# Patient Record
Sex: Female | Born: 1990 | Race: Asian | Hispanic: No | Marital: Married | Smoking: Never smoker
Health system: Southern US, Community
[De-identification: ages and names within clinical notes are randomized; demographics above are authoritative.]

## PROBLEM LIST (undated history)

## (undated) DIAGNOSIS — D582 Other hemoglobinopathies: Secondary | ICD-10-CM

## (undated) DIAGNOSIS — I350 Nonrheumatic aortic (valve) stenosis: Secondary | ICD-10-CM

## (undated) DIAGNOSIS — R000 Tachycardia, unspecified: Secondary | ICD-10-CM

## (undated) DIAGNOSIS — D649 Anemia, unspecified: Secondary | ICD-10-CM

## (undated) DIAGNOSIS — I099 Rheumatic heart disease, unspecified: Secondary | ICD-10-CM

## (undated) HISTORY — DX: Tachycardia, unspecified: R00.0

## (undated) HISTORY — DX: Anemia, unspecified: D64.9

## (undated) HISTORY — DX: Nonrheumatic aortic (valve) stenosis: I35.0

## (undated) HISTORY — DX: Other hemoglobinopathies: D58.2

## (undated) HISTORY — DX: Rheumatic heart disease, unspecified: I09.9

## (undated) HISTORY — PX: EYE SURGERY: SHX253

## (undated) HISTORY — PX: NO PAST SURGERIES: SHX2092

---

## 2009-02-13 DIAGNOSIS — I099 Rheumatic heart disease, unspecified: Secondary | ICD-10-CM

## 2009-02-13 HISTORY — DX: Rheumatic heart disease, unspecified: I09.9

## 2009-12-03 ENCOUNTER — Emergency Department (HOSPITAL_COMMUNITY)
Admission: EM | Admit: 2009-12-03 | Discharge: 2009-12-03 | Payer: Self-pay | Source: Home / Self Care | Admitting: Family Medicine

## 2010-10-04 ENCOUNTER — Inpatient Hospital Stay (HOSPITAL_COMMUNITY)
Admission: AD | Admit: 2010-10-04 | Discharge: 2010-10-04 | Disposition: A | Discharge status: Home / Self Care | Payer: Medicaid Other | Source: Ambulatory Visit | Attending: Family Medicine | Admitting: Family Medicine

## 2010-10-04 ENCOUNTER — Encounter: Payer: Self-pay | Admitting: Cardiovascular Disease

## 2010-10-04 ENCOUNTER — Inpatient Hospital Stay (HOSPITAL_COMMUNITY): Payer: Medicaid Other

## 2010-10-04 ENCOUNTER — Other Ambulatory Visit: Payer: Self-pay | Admitting: Family Medicine

## 2010-10-04 DIAGNOSIS — O368390 Maternal care for abnormalities of the fetal heart rate or rhythm, unspecified trimester, not applicable or unspecified: Secondary | ICD-10-CM | POA: Insufficient documentation

## 2010-10-04 DIAGNOSIS — Z3400 Encounter for supervision of normal first pregnancy, unspecified trimester: Secondary | ICD-10-CM

## 2010-10-04 LAB — CBC
HCT: 34.2 % — ABNORMAL LOW (ref 36.0–46.0)
MCV: 72.6 fL — ABNORMAL LOW (ref 78.0–100.0)
RBC: 4.71 MIL/uL (ref 3.87–5.11)
WBC: 13.7 10*3/uL — ABNORMAL HIGH (ref 4.0–10.5)

## 2010-10-04 LAB — TYPE AND SCREEN: Antibody Screen: NEGATIVE

## 2010-10-04 LAB — GC/CHLAMYDIA PROBE AMP, GENITAL: Gonorrhea: NEGATIVE

## 2010-10-04 LAB — HIV ANTIBODY (ROUTINE TESTING W REFLEX): HIV: NONREACTIVE

## 2010-10-04 LAB — ABO/RH: ABO/RH(D): B POS

## 2010-10-04 LAB — RUBELLA ANTIBODY, IGM: Rubella: IMMUNE

## 2010-10-04 NOTE — ED Provider Notes (Addendum)
Chief Complaint:  Possible IUFD  Patricia Frederick is  20 y.o. G1 who presents with concern for IUFD.  She was seen today at [facility] for 1st prenatal visit and they were unable to obtain FHT by doppler or on ultrasound.  She was sent here for further evaluation.  She denies any FM, no LOF, no VB, no cramping or abdominal pain.  She has had some nausea.  Obstetrical/Gynecological History: OB History    No data available      Past Medical History: No past medical history on file.  Past Surgical History: No past surgical history on file.  Family History: No family history on file.  Social History: History  Substance Use Topics  . Smoking status: Not on file  . Smokeless tobacco: Not on file  . Alcohol Use: Not on file    Allergies: Allergies no known allergies  No prescriptions prior to admission    Review of Systems - Negative except listed above in HPI  Physical Exam   There were no vitals taken for this visit.  General: General appearance - alert, well appearing, and in no distress Chest - clear to auscultation, no wheezes, rales or rhonchi, symmetric air entry Heart - normal rate, regular rhythm, normal S1, S2, no murmurs, rubs, clicks or gallops Abdomen - gravid, size cwd, soft, nontender Extremities - peripheral pulses normal, no pedal edema, no clubbing or cyanosis Skin - normal coloration and turgor, no rashes, no suspicious skin lesions noted   Labs: No results found for this or any previous visit (from the past 24 hour(s)). Imaging Studies:  No results found.   Assessment: 20yo G1 with concern for IUFD  Plan: -Ultrasound to assess fetal status -Will check CBC and type and screen  Will adjust plan according to results.  I have discussed this case with Philipp Deputy CNM who is in agreement with this plan  Lindaann Slough MD  Addendum- ultrasound shows viable 10+2wk fetus, EDD 04/02/11.  Will send pt with pregnancy verification letter to continue [facility]  at Health Dept. Delton See MD

## 2010-10-04 NOTE — Progress Notes (Signed)
Sent in from health dept, 1st appt today 19.5wks- no FH.  Sent in to confirm IUFD.  Non- english speaking.  Pt taken directly to rm.  Translation phone available.  MD notified of arrival

## 2010-10-04 NOTE — Progress Notes (Signed)
Pt seen at [facility] health for 1st prenatal visit, pt sent to [facility] for ultrasound, not heart beat found at 19 5/7 wks. As per LMP

## 2010-10-18 ENCOUNTER — Encounter: Payer: Self-pay | Admitting: Cardiovascular Disease

## 2010-10-19 ENCOUNTER — Encounter: Payer: Self-pay | Admitting: Cardiovascular Disease

## 2010-10-19 ENCOUNTER — Ambulatory Visit (INDEPENDENT_AMBULATORY_CARE_PROVIDER_SITE_OTHER): Payer: Self-pay | Admitting: Cardiovascular Disease

## 2010-10-19 VITALS — BP 102/74 | HR 101 | Resp 12 | Wt 116.0 lb

## 2010-10-19 DIAGNOSIS — R9431 Abnormal electrocardiogram [ECG] [EKG]: Secondary | ICD-10-CM

## 2010-10-19 DIAGNOSIS — R011 Cardiac murmur, unspecified: Secondary | ICD-10-CM

## 2010-10-19 DIAGNOSIS — R000 Tachycardia, unspecified: Secondary | ICD-10-CM

## 2010-10-19 NOTE — Patient Instructions (Signed)
Your physician recommends that you schedule a follow-up appointment in: AS NEEDED  Your physician recommends that you continue on your current medications as directed. Please refer to the Current Medication list given to you today.  Your physician has requested that you have an echocardiogram. Echocardiography is a painless test that uses sound waves to create images of your heart. It provides your doctor with information about the size and shape of your heart and how well your heart's chambers and valves are working. This procedure takes approximately one hour. There are no restrictions for this procedure. DX MURMUR  PT'S CONVENIENCE

## 2010-10-19 NOTE — Assessment & Plan Note (Signed)
Nonspecfic ECG changes not likely reflective of severe valve disease  BP ok

## 2010-10-19 NOTE — Assessment & Plan Note (Signed)
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  Echo.  Should not be an issue with pregancy

## 2010-10-19 NOTE — Progress Notes (Signed)
20 yo referred by [facility] of [facility] for murmur.  She is 5 months pregnant with due date in March.  History through husband as patient does not speak [place].  Have only been here from [place] about a year.  Has a healthy 20 yo girl at home with no prior obstetric complications.  ? Rheumatic heart disease.  Has exertional dyspnea primarily since pregnancy.  No recent echo.  HR been on high side.  No palpitations or SSCP.  No meds.  No prevoius surgeries outside of first pregnancy.    ROS: Denies fever, malais, weight loss, blurry vision, decreased visual acuity, cough, sputum, SOB, hemoptysis, pleuritic pain, palpitaitons, heartburn, abdominal pain, melena, lower extremity edema, claudication, or rash.  All other systems reviewed and negative   General: Affect appropriate Healthy:  appears stated age HEENT: normal Neck supple with no adenopathy JVP normal no bruits no thyromegaly Lungs clear with no wheezing and good diaphragmatic motion Heart:  S1/S2 SEM  No ,rub, gallop or click PMI normal Abdomen: benighn, BS positve, no tenderness, no AAA no bruit.  No HSM or HJR Distal pulses intact with no bruits No edema Neuro non-focal Skin warm and dry No muscular weakness  Medications Current Outpatient Prescriptions  Medication Sig Dispense Refill  . Prenatal Vit-Fe Psac Cmplx-FA (PRENATAL MULTIVITAMIN) 60-1 MG tablet Take 1 tablet by mouth daily with breakfast.          Allergies Review of patient's allergies indicates no known allergies.  Family History: Family History  Problem Relation Age of Onset  . Asthma Other     Social History: History   Social History  . Marital Status: Married    Spouse Name: N/A    Number of Children: N/A  . Years of Education: N/A   Occupational History  . Not on file.   Social History Main Topics  . Smoking status: Not on file  . Smokeless tobacco: Not on file  . Alcohol Use:   . Drug Use:   . Sexually Active:    Other Topics  Concern  . Not on file   Social History Narrative  . No narrative on file    Electrocardiogram:  NSR 97 nonspecfic ST/T wave changes  Assessment and Plan

## 2010-10-19 NOTE — Assessment & Plan Note (Signed)
Hb ok ? TSH done by OB/GYN  Echo to assess EF.  Likely related to pregnancy

## 2010-10-27 ENCOUNTER — Ambulatory Visit (HOSPITAL_COMMUNITY): Payer: Medicaid Other | Attending: Cardiovascular Disease

## 2010-10-27 DIAGNOSIS — I379 Nonrheumatic pulmonary valve disorder, unspecified: Secondary | ICD-10-CM | POA: Insufficient documentation

## 2010-10-27 DIAGNOSIS — R0989 Other specified symptoms and signs involving the circulatory and respiratory systems: Secondary | ICD-10-CM | POA: Insufficient documentation

## 2010-10-27 DIAGNOSIS — R000 Tachycardia, unspecified: Secondary | ICD-10-CM | POA: Insufficient documentation

## 2010-10-27 DIAGNOSIS — R0609 Other forms of dyspnea: Secondary | ICD-10-CM | POA: Insufficient documentation

## 2010-10-27 DIAGNOSIS — R9431 Abnormal electrocardiogram [ECG] [EKG]: Secondary | ICD-10-CM

## 2010-10-27 DIAGNOSIS — R011 Cardiac murmur, unspecified: Secondary | ICD-10-CM

## 2010-10-27 DIAGNOSIS — I059 Rheumatic mitral valve disease, unspecified: Secondary | ICD-10-CM | POA: Insufficient documentation

## 2010-10-27 DIAGNOSIS — I079 Rheumatic tricuspid valve disease, unspecified: Secondary | ICD-10-CM | POA: Insufficient documentation

## 2010-11-01 ENCOUNTER — Other Ambulatory Visit: Payer: Self-pay | Admitting: Family Medicine

## 2010-11-01 DIAGNOSIS — Z3689 Encounter for other specified antenatal screening: Secondary | ICD-10-CM

## 2010-11-02 ENCOUNTER — Telehealth: Payer: Self-pay | Admitting: Cardiovascular Disease

## 2010-11-02 NOTE — Telephone Encounter (Signed)
Returning call back to nurse.  

## 2010-11-03 ENCOUNTER — Encounter: Payer: Self-pay | Admitting: *Deleted

## 2010-11-04 ENCOUNTER — Ambulatory Visit (INDEPENDENT_AMBULATORY_CARE_PROVIDER_SITE_OTHER): Payer: Medicaid Other | Admitting: Physician Assistant

## 2010-11-04 ENCOUNTER — Encounter: Payer: Self-pay | Admitting: Physician Assistant

## 2010-11-04 VITALS — BP 100/70 | HR 94 | Resp 18 | Ht 60.0 in | Wt 116.8 lb

## 2010-11-04 DIAGNOSIS — I359 Nonrheumatic aortic valve disorder, unspecified: Secondary | ICD-10-CM

## 2010-11-04 DIAGNOSIS — I350 Nonrheumatic aortic (valve) stenosis: Secondary | ICD-10-CM | POA: Insufficient documentation

## 2010-11-04 NOTE — Assessment & Plan Note (Signed)
I spent 30 minutes discussing the findings of her echo with the patient and her brother.  Communication was affected by the language barrier.  We discussed the severity of her AS.  I have explained that she is very high risk for peripartum acute CHF.  I have also explained that she will need AVR after delivery as soon as possible.  She cannot get pregnant again until her AV is corrected.  It is hard for me to know if she is describing exertional angina.  She does note some chest pain and DOE.  She did have these same symptoms with her first pregnancy as well.  I spoke with Dr. Eden Emms over the telephone.  He will likely place her on Lasix when she starts her 3rd trimester to help avoid volume overload.  I will have her see him back in 3-4 weeks.  We will try to be in touch with her Ob to apprise them of the severity of her AS and that she is a very high risk pregnancy.

## 2010-11-04 NOTE — Progress Notes (Signed)
History of Present Illness: Primary Cardiologist:  Dr. Charlton Haws   Patricia Frederick is a 20 y.o. [place] female who presents to follow up on her 2D echo.  She saw Dr. Eden Emms on 9/5 for a murmur.  She noted DOE primarily since pregnancy.  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  Dr. Eden Emms asked that the patient follow up with him to discuss her echo results, however she was put on my schedule today.  He did note that she would be high risk for CHF with delivery and that she will need AVR before she can get pregnant again.  She is here with her brother who speaks some English and helps interpret.  She does note some chest pain with walking too fast.  She also notes DOE.  She probably describes 2b-3 symptoms.  No orthopnea, PND or edema.  No syncope.  She notes chest pain and dyspnea with her last pregnancy, but this is more severe.    Past Medical History  Diagnosis Date  . Aortic stenosis, severe     echo 9/12: EF 55-65%, severe AS, mean gradient 49 mmHg, unicuspid Aortic Valve  . Anemia   . Tachycardia     Current Outpatient Prescriptions  Medication Sig Dispense Refill  . Prenatal Vit-Fe Psac Cmplx-FA (PRENATAL MULTIVITAMIN) 60-1 MG tablet Take 1 tablet by mouth daily with breakfast.          Allergies: No Known Allergies  Social Hx:  Non smoker  ROS:  Please see the history of present illness.  All other systems reviewed and negative.   Vital Signs: BP 100/70  Pulse 94  Resp 18  Ht 5' (1.524 m)  Wt 116 lb 12.8 oz (52.98 kg)  BMI 22.81 kg/m2  PHYSICAL EXAM: Well nourished, well developed, in no acute distress HEENT: normal Neck: no JVD Cardiac:  normal S1, S2; RRR; 2/6 harsh systolic murmur at RUSB Lungs:  clear to auscultation bilaterally, no wheezing, rhonchi or rales Abd: soft; fundus below umbilicus Ext: no edema Skin: warm and dry Neuro:  CNs 2-12 intact, no focal abnormalities noted  ASSESSMENT AND PLAN:

## 2010-11-04 NOTE — Patient Instructions (Addendum)
Aortic Stenosis Aortic stenosis, or aortic valve stenosis, is a narrowing of the aortic valve. When the aortic valve is narrowed, the valve does not open and close very well. This restricts blood flow between the left side of the heart and the aorta (the large artery which takes blood to the rest of the body). This restriction makes it hard for your heart to pump blood. This extra work can weaken your heart and can lead to heart failure.  CAUSES Causes of aortic valve stenosis can vary. Some of these can include:  Calcium deposits on the aortic valve. Calcium can buildup on the aortic valve and make it stiff. This cause of aortic stenosis is most common in people over the age of 69.   Congenital heart defect. This can occur during the development of the fetus and can result in an aortic valve defect.   Rheumatic fever. Rheumatic fever is a bacterial infection that can develop from a strep throat infection. The bacteria from rheumatic fever can attach themselves to the valve. This can cause scarring on the aortic valve, causing it to become narrow.  SYMPTOMS Symptoms of aortic valve stenosis develop when the valve disease is severe. Symptoms can include:  Shortness of breath, especially with physical activity.   Feeling tired (fatigue).   Chest pain (angina) or tightness.   Feeing your heart race or beat funny (heart palpitations).   Dizziness or fainting.  DIAGNOSIS Aortic stenosis is diagnosed through:  A physical exam and symptoms.   A heart murmur.   Echocardiography. This test uses sound waves to produce images of your heart.  TREATMENT  Surgery is the treatment for aortic valve stenosis.   Surgery may not be needed right away. Surgery is necessary when narrowing of the aortic valve becomes severe, and symptoms develop or become worse.   Medications cannot reverse aortic valve stenosis.  HOME CARE INSTRUCTIONS  If you have aortic stenosis, you many need to avoid strenuous  physical activity. Talk with your caregiver about what types of activities you should avoid.   If you are a woman with aortic valve stenosis and are of child-bearing age, talk to your caregiver before you become pregnant.   If you become pregnant, you will need to be monitored by your obstetrician and cardiologist throughout your pregnancy, labor and delivery, and after delivery.  SEEK IMMEDIATE MEDICAL CARE IF YOU DEVELOP:  Chest pain or tightness.   Shortness of breath or difficulty breathing.   Lightheadedness or fainting.   Heart palpitations or skipped heart beats.  Document Released: 10/29/2002 Document Re-Released: 07/20/2009 [facility] Patient Information 2011 [place], [place].  Your physician recommends that you schedule a follow-up appointment in: 11/23/10 @ 10:30 with DR. Eden Emms

## 2010-11-04 NOTE — Telephone Encounter (Signed)
FAXED COPY OF ECHO TO OB NURSE YESTERDAY AND PT HAD APPT WITH PA TODAY./ CY

## 2010-11-07 ENCOUNTER — Telehealth: Payer: Self-pay | Admitting: *Deleted

## 2010-11-07 ENCOUNTER — Ambulatory Visit (INDEPENDENT_AMBULATORY_CARE_PROVIDER_SITE_OTHER): Payer: Medicaid Other | Admitting: Family Medicine

## 2010-11-07 DIAGNOSIS — Z331 Pregnant state, incidental: Secondary | ICD-10-CM

## 2010-11-07 DIAGNOSIS — I359 Nonrheumatic aortic valve disorder, unspecified: Secondary | ICD-10-CM

## 2010-11-07 DIAGNOSIS — I350 Nonrheumatic aortic (valve) stenosis: Secondary | ICD-10-CM

## 2010-11-07 DIAGNOSIS — O0990 Supervision of high risk pregnancy, unspecified, unspecified trimester: Secondary | ICD-10-CM

## 2010-11-07 LAB — POCT URINALYSIS DIP (DEVICE)
Bilirubin Urine: NEGATIVE
Ketones, ur: NEGATIVE mg/dL
Leukocytes, UA: NEGATIVE
Nitrite: NEGATIVE
Protein, ur: NEGATIVE mg/dL

## 2010-11-07 NOTE — Patient Instructions (Addendum)
Aortic Stenosis Aortic stenosis, or aortic valve stenosis, is a narrowing of the aortic valve. When the aortic valve is narrowed, the valve does not open and close very well. This restricts blood flow between the left side of the heart and the aorta (the large artery which takes blood to the rest of the body). This restriction makes it hard for your heart to pump blood. This extra work can weaken your heart and can lead to heart failure.  CAUSES Causes of aortic valve stenosis can vary. Some of these can include:  Calcium deposits on the aortic valve. Calcium can buildup on the aortic valve and make it stiff. This cause of aortic stenosis is most common in people over the age of 72.   Congenital heart defect. This can occur during the development of the fetus and can result in an aortic valve defect.   Rheumatic fever. Rheumatic fever is a bacterial infection that can develop from a strep throat infection. The bacteria from rheumatic fever can attach themselves to the valve. This can cause scarring on the aortic valve, causing it to become narrow.  SYMPTOMS Symptoms of aortic valve stenosis develop when the valve disease is severe. Symptoms can include:  Shortness of breath, especially with physical activity.   Feeling tired (fatigue).   Chest pain (angina) or tightness.   Feeing your heart race or beat funny (heart palpitations).   Dizziness or fainting.  DIAGNOSIS Aortic stenosis is diagnosed through:  A physical exam and symptoms.   A heart murmur.   Echocardiography. This test uses sound waves to produce images of your heart.  TREATMENT  Surgery is the treatment for aortic valve stenosis.   Surgery may not be needed right away. Surgery is necessary when narrowing of the aortic valve becomes severe, and symptoms develop or become worse.   Medications cannot reverse aortic valve stenosis.  HOME CARE INSTRUCTIONS  If you have aortic stenosis, you many need to avoid strenuous  physical activity. Talk with your caregiver about what types of activities you should avoid.   If you are a woman with aortic valve stenosis and are of child-bearing age, talk to your caregiver before you become pregnant.   If you become pregnant, you will need to be monitored by your obstetrician and cardiologist throughout your pregnancy, labor and delivery, and after delivery.  SEEK IMMEDIATE MEDICAL CARE IF YOU DEVELOP:  Chest pain or tightness.   Shortness of breath or difficulty breathing.   Lightheadedness or fainting.   Heart palpitations or skipped heart beats.  Document Released: 10/29/2002 Document Re-Released: 07/20/2009 [facility] Patient Information 2011 [place], [place].

## 2010-11-07 NOTE — Progress Notes (Signed)
  Subjective:    Patricia Frederick is a 20 y.o. female being seen today for her obstetrical visit. There is a language barrier as the patient speaks Nepali, Garment/textile technologist service is used today. She is at 14.[redacted] wks gestation by [place] at 10.2 wks giving [facility] 30 April 2011. Patient reports shortness of breath with walking fast or long distances, and when carrying things while walking, some chest pain with these activities as well. Fetal movement: not felt yet.  Patient was seen on 13 Sept by PA who works with Patricia Frederick at [facility] and had echocardiogram at that time. She reports being told she will need surgery to fix her heart valve after pregnancy. She cannot tell me being told the severity of her disease. She was told she had heart disease at age 20 when she had an Xray for an entrance physical exam to come to [place]. She states she was given a medication because of the swelling on her heart and the pain she had but she cannot remember the name of the medication. She has not taken it since coming to [place]. She states that she had one pregnancy without complications during or after the delivery at term after finding out about her heart condition. She states she was not given any special medications and did not have any extra testing while pregnant. She reports that the baby was small for gestational age, but she was not given any particular reason as to why. She reports that she had the chest pain and SOB during that pregnancy, but this time it is worse, and this frightens her. She is not currently working, but does have to care for her child at home.  Menstrual History: OB History    Grav Para Term Preterm Abortions TAB SAB Ect Mult Living   2 1 1       1        Objective:     BP 102/74  Temp 98.2 F (36.8 C)  Ht 4\' 10"  (1.473 m)  Wt 117 lb 4.8 oz (53.207 kg)  BMI 24.52 kg/m2  Breastfeeding? Unknown Uterine Size: size equals dates  Fetal heart Tones: 165    General: AAO, NAD,  poor eye contact Heart: Murmur heard, holosystolic, loudest over aortic valve, radiates throughout chest, click heard at end of first heart sound Lungs: CTA B/L  Assessment:    Pregnancy 14 and 6/7 weeks  Severe Aortic Stenosis Significant language barrier  Plan:    Problem list reviewed and updated, and prioritized. Labs reviewed and records from health department.  Contacted and discussed case with Patricia Frederick. He states she would need 20mg  daily lasix starting at the end of 2nd trimester, and that she is an extremely high risk pregnancy. He recommended discussion regarding late term abortion. Patient has approximately 25% chance of complications during pregnancy, and after delivery. She would need a "short and least painful method of delivery", and that if her labor is prolonged and is going beyond 48 hours, that she would need a c-section.  Patricia Frederick spoke to Patricia Frederick in MFM who recommended consultation with them regarding care of pregnancy, and possible therapeutic termination as well. Patient was referred and will be seen tomorrow by Patricia Frederick.  Follow up in 1 weeks. Greater than 50% of this appointment (approximately 45 minutes) was spent coordinating care.

## 2010-11-07 NOTE — Telephone Encounter (Signed)
Marnie from the [facility] returned my call today from 11/04/10 in reference who was the primary OB for Mrs. Darji. Lorelle Formosa states to me today the Dr. Rotate and so there is not one specific Dr. For the pt; however you may send information and/or call the Nursing Director Dr. Penne Lash, phone #'s are [phone] Nurses station; [phone] this is the main # for the [facility] per [facility]. Danielle Rankin

## 2010-11-07 NOTE — Progress Notes (Signed)
P=99,called Health dept for records, Used [employer] Interpreter #[id number],c/o pelvic pain occasionally,denies vaginal discharge

## 2010-11-08 ENCOUNTER — Ambulatory Visit (HOSPITAL_COMMUNITY)
Admission: RE | Admit: 2010-11-08 | Discharge: 2010-11-08 | Disposition: A | Discharge status: Home / Self Care | Payer: Medicaid Other | Source: Ambulatory Visit | Attending: Family Medicine | Admitting: Family Medicine

## 2010-11-14 ENCOUNTER — Ambulatory Visit (INDEPENDENT_AMBULATORY_CARE_PROVIDER_SITE_OTHER): Payer: Medicaid Other | Admitting: Obstetrics & Gynecology

## 2010-11-14 DIAGNOSIS — Z23 Encounter for immunization: Secondary | ICD-10-CM

## 2010-11-14 DIAGNOSIS — Z603 Acculturation difficulty: Secondary | ICD-10-CM | POA: Insufficient documentation

## 2010-11-14 DIAGNOSIS — O0990 Supervision of high risk pregnancy, unspecified, unspecified trimester: Secondary | ICD-10-CM

## 2010-11-14 DIAGNOSIS — I359 Nonrheumatic aortic valve disorder, unspecified: Secondary | ICD-10-CM

## 2010-11-14 DIAGNOSIS — I2510 Atherosclerotic heart disease of native coronary artery without angina pectoris: Secondary | ICD-10-CM

## 2010-11-14 DIAGNOSIS — R000 Tachycardia, unspecified: Secondary | ICD-10-CM

## 2010-11-14 DIAGNOSIS — Z609 Problem related to social environment, unspecified: Secondary | ICD-10-CM

## 2010-11-14 DIAGNOSIS — I350 Nonrheumatic aortic (valve) stenosis: Secondary | ICD-10-CM

## 2010-11-14 LAB — POCT URINALYSIS DIP (DEVICE)
Glucose, UA: NEGATIVE mg/dL
Leukocytes, UA: NEGATIVE
Specific Gravity, Urine: 1.02 (ref 1.005–1.030)
Urobilinogen, UA: 0.2 mg/dL (ref 0.0–1.0)

## 2010-11-14 LAB — SCREEN, FIRST TRIMESTER, SERUM: MSAFP Scr: NEGATIVE

## 2010-11-14 MED ORDER — INFLUENZA VIRUS VACC SPLIT PF IM SUSP
0.5000 mL | Freq: Once | INTRAMUSCULAR | Status: AC
  Administered 2010-11-14: 0.5 mL via INTRAMUSCULAR

## 2010-11-14 NOTE — Progress Notes (Signed)
Interpreter #: [id number] Pain: pelvic Pressure: none

## 2010-11-14 NOTE — Progress Notes (Signed)
Addended by: Darrel Hoover on: 11/14/2010 11:13 AM   Modules accepted: Orders

## 2010-11-14 NOTE — Progress Notes (Signed)
N[place]ali interpreter [phone] used today.  No complaints today.  Cardiology appointment on 11/23/10; anatomy scan on 10/16.  Offered quad screen today, patient agrees with recommendation.  Will draw today.  She will also get flu shot today.  Cardiac and PTL precautions advised.

## 2010-11-14 NOTE — Progress Notes (Signed)
Addended by: Lynnell Dike on: 11/14/2010 11:10 AM   Modules accepted: Orders

## 2010-11-21 DIAGNOSIS — Z8679 Personal history of other diseases of the circulatory system: Secondary | ICD-10-CM

## 2010-11-21 DIAGNOSIS — D582 Other hemoglobinopathies: Secondary | ICD-10-CM | POA: Insufficient documentation

## 2010-11-21 LAB — CYTOLOGY - PAP
Pap Smear: NEGATIVE
Urine Culture, OB: NEGATIVE

## 2010-11-21 NOTE — Progress Notes (Signed)
Pt had psychosocial and domestic violence screening done 10/04/10 at Health dept

## 2010-11-23 ENCOUNTER — Ambulatory Visit (INDEPENDENT_AMBULATORY_CARE_PROVIDER_SITE_OTHER): Payer: Medicaid Other | Admitting: Cardiovascular Disease

## 2010-11-23 ENCOUNTER — Encounter: Payer: Self-pay | Admitting: Cardiovascular Disease

## 2010-11-23 DIAGNOSIS — I359 Nonrheumatic aortic valve disorder, unspecified: Secondary | ICD-10-CM

## 2010-11-23 DIAGNOSIS — O0990 Supervision of high risk pregnancy, unspecified, unspecified trimester: Secondary | ICD-10-CM

## 2010-11-23 DIAGNOSIS — I350 Nonrheumatic aortic (valve) stenosis: Secondary | ICD-10-CM

## 2010-11-23 DIAGNOSIS — R000 Tachycardia, unspecified: Secondary | ICD-10-CM

## 2010-11-23 NOTE — Patient Instructions (Signed)
Your physician wants you to follow-up in:   3 MONTHS WITH  DR NISHAN  You will receive a reminder letter in the mail two months in advance. If you don't receive a letter, please call our office to schedule the follow-up appointment. Your physician recommends that you continue on your current medications as directed. Please refer to the Current Medication list given to you today.  

## 2010-11-23 NOTE — Progress Notes (Signed)
20 yo initially seen 9/5.  Has severe AS with a mean gradient of 49.  Seen by Dr leggett and referred to [facility] maternal and fetal medicine.  I personally discussed the situation with Dr Penne Lash a few weeks ago and today on the phone.  Morbidity and Mortality very high.  Recommended termination of pregnancy.  Patient shows up in office today with no interpreter and still pregnant.  Previously had complained of SSCP and dyspnea.  Seems ok today No clinical CHF Saw a Dr Sherrie George on 9/25 to discuss situation but I have no notes. Paged and spoke to her office but never got a return call.    ROS: Denies fever, malais, weight loss, blurry vision, decreased visual acuity, cough, sputum, SOB, hemoptysis, pleuritic pain, palpitaitons, heartburn, abdominal pain, melena, lower extremity edema, claudication, or rash.  All other systems reviewed and negative  General: Affect appropriate Healthy:  appears stated age Pregnant HEENT: normal Neck supple with no adenopathy JVP normal no bruits no thyromegaly Lungs clear with no wheezing and good diaphragmatic motion Heart:  S1/S2 AS  murmur,rub, gallop or click PMI normal Abdomen: benighn, BS positve, no tenderness, no AAA no bruit.  No HSM or HJR Distal pulses intact with no bruits No edema Neuro non-focal Skin warm and dry No muscular weakness   Current Outpatient Prescriptions  Medication Sig Dispense Refill  . Prenatal Vit-Fe Psac Cmplx-FA (PRENATAL MULTIVITAMIN) 60-1 MG tablet Take 1 tablet by mouth daily with breakfast.          Allergies  Review of patient's allergies indicates no known allergies.  Electrocardiogram:  9/5 NSR LVH T wave inversion lead 3   Assessment and Plan

## 2010-11-24 ENCOUNTER — Ambulatory Visit (HOSPITAL_COMMUNITY)
Admission: RE | Admit: 2010-11-24 | Discharge: 2010-11-24 | Disposition: A | Discharge status: Home / Self Care | Payer: Medicaid Other | Source: Ambulatory Visit | Attending: Family Medicine | Admitting: Family Medicine

## 2010-11-24 DIAGNOSIS — Z363 Encounter for antenatal screening for malformations: Secondary | ICD-10-CM | POA: Insufficient documentation

## 2010-11-24 DIAGNOSIS — Z1389 Encounter for screening for other disorder: Secondary | ICD-10-CM | POA: Insufficient documentation

## 2010-11-24 DIAGNOSIS — O358XX0 Maternal care for other (suspected) fetal abnormality and damage, not applicable or unspecified: Secondary | ICD-10-CM | POA: Insufficient documentation

## 2010-11-24 NOTE — Assessment & Plan Note (Signed)
Will need surgery evaluation when pregnancy either terminated or comes to fruition.  Apparantly will see cardiologist at [facility] where baby will be delivered electively if pregnancy continues

## 2010-11-24 NOTE — Assessment & Plan Note (Signed)
Discussed with Dr Penne Lash.  Told her that she and Dr Sherrie George need to speak as my understanding is that the patient had agreed to terminate pregnancy but she indicates possible continuation with delivery and cardiac F/U at [facility]

## 2010-11-24 NOTE — Assessment & Plan Note (Signed)
Related to pregnancy and AS  No beta blocker at this time with pregnancy

## 2010-11-28 ENCOUNTER — Ambulatory Visit (INDEPENDENT_AMBULATORY_CARE_PROVIDER_SITE_OTHER): Payer: Medicaid Other | Admitting: Family Medicine

## 2010-11-28 ENCOUNTER — Other Ambulatory Visit: Payer: Self-pay | Admitting: Obstetrics and Gynecology

## 2010-11-28 VITALS — BP 100/71 | Temp 98.0°F | Wt 119.8 lb

## 2010-11-28 DIAGNOSIS — O0990 Supervision of high risk pregnancy, unspecified, unspecified trimester: Secondary | ICD-10-CM

## 2010-11-28 DIAGNOSIS — D582 Other hemoglobinopathies: Secondary | ICD-10-CM

## 2010-11-28 DIAGNOSIS — I359 Nonrheumatic aortic valve disorder, unspecified: Secondary | ICD-10-CM

## 2010-11-28 LAB — POCT URINALYSIS DIP (DEVICE)
Glucose, UA: NEGATIVE mg/dL
Hgb urine dipstick: NEGATIVE
Leukocytes, UA: NEGATIVE
Nitrite: NEGATIVE
Urobilinogen, UA: 0.2 mg/dL (ref 0.0–1.0)

## 2010-11-28 NOTE — Progress Notes (Signed)
Used Radio broadcast assistant Resources: Patricia Frederick Pt has some pelvic pain at times.

## 2010-11-28 NOTE — Patient Instructions (Signed)
Aortic Stenosis  Aortic stenosis, or aortic valve stenosis, is a narrowing of the aortic valve. When the aortic valve is narrowed, the valve does not open and close very well. This restricts blood flow between the left side of the heart and the aorta (the large artery which takes blood to the rest of the body). This restriction makes it hard for your heart to pump blood. This extra work can weaken your heart and can lead to heart failure.    CAUSES  Causes of aortic valve stenosis can vary. Some of these can include:   Calcium deposits on the aortic valve. Calcium can buildup on the aortic valve and make it stiff. This cause of aortic stenosis is most common in people over the age of 65.    Congenital heart defect. This can occur during the development of the fetus and can result in an aortic valve defect.    Rheumatic fever. Rheumatic fever is a bacterial infection that can develop from a strep throat infection. The bacteria from rheumatic fever can attach themselves to the valve. This can cause scarring on the aortic valve, causing it to become narrow.   SYMPTOMS  Symptoms of aortic valve stenosis develop when the valve disease is severe. Symptoms can include:   Shortness of breath, especially with physical activity.    Feeling tired (fatigue).    Chest pain (angina) or tightness.    Feeing your heart race or beat funny (heart palpitations).    Dizziness or fainting.   DIAGNOSIS  Aortic stenosis is diagnosed through:   A physical exam and symptoms.    A heart murmur.    Echocardiography. This test uses sound waves to produce images of your heart.   TREATMENT   Surgery is the treatment for aortic valve stenosis.    Surgery may not be needed right away. Surgery is necessary when narrowing of the aortic valve becomes severe, and symptoms develop or become worse.    Medications cannot reverse aortic valve stenosis.   HOME CARE INSTRUCTIONS    If you have aortic stenosis, you many need to avoid strenuous physical activity. Talk with your caregiver about what types of activities you should avoid.    If you are a woman with aortic valve stenosis and are of child-bearing age, talk to your caregiver before you become pregnant.    If you become pregnant, you will need to be monitored by your obstetrician and cardiologist throughout your pregnancy, labor and delivery, and after delivery.   SEEK IMMEDIATE MEDICAL CARE IF YOU DEVELOP:   Chest pain or tightness.    Shortness of breath or difficulty breathing.    Lightheadedness or fainting.    Heart palpitations or skipped heart beats.   Document Released: 10/29/2002 Document Re-Released: 07/20/2009  ExitCare Patient Information 2011 [employer].

## 2010-11-28 NOTE — Progress Notes (Signed)
Having sscp when walking quickly.  To f/u with cards in [place] for possible balloon procedure. Subjective:    Patricia Frederick is a 20 y.o. G2P1001 [redacted]w[redacted]d being seen today for her obstetrical visit.  Patient reports SSCP with walking. Fetal movement: not yet.  Objective:    BP 100/71  Temp 98 F (36.7 C)  Wt 119 lb 12.8 oz (54.341 kg)  Physical Exam  Exam  FHT:  160 BPM  Uterine Size: 18 cm  Presentation: unsure     Assessment:    Pregnancy:  G2P1001    Plan:    Patient Active Problem List  Diagnoses  . Abnormal ECG  . Tachycardia  . Aortic stenosis, severe  . Unspecified high-risk pregnancy  . Language barrier, speaks [place]  . History of rheumatic heart disease  . Hemoglobin E trait    To [facility] on Wednesday Follow up in 2 Weeks.

## 2010-11-28 NOTE — Progress Notes (Signed)
Called patient at 3:55;pm with [facility] [phone] and informed her that the Social Worker was able to get her Medicaid transportation set up for her Wednesday Cardiology appt at [facility]- Informed her Medicaid transportation will pick her and her sister up sometime between 0840 and 0920- be ready at 0840 and will bring her back at 2pm Also encouraged pt. It is very important she keep this appointment. Marland Kitchenpt. Voices understanding.

## 2010-11-28 NOTE — Progress Notes (Signed)
Patient has an appt. At [facility] on [place]. Oct. 17, 2012 at 11 am for an echocardiogram. It is at the first floor clinical ultrasound dept. In [place]. The [facility]/S dept.there  will get a Nepali interpreter through their language line. Patient is aware of the appt. but needs to be called re:  transportation there. This patient states she will be home today after 2 pm to answer her phone. Dr. Sherrie George in MFM states patient absolutely needs to be at this appt.(Dr. Shawnie Pons aware of this)

## 2010-11-29 ENCOUNTER — Ambulatory Visit (HOSPITAL_COMMUNITY)
Admission: RE | Admit: 2010-11-29 | Discharge: 2010-11-29 | Disposition: A | Discharge status: Home / Self Care | Payer: Medicaid Other | Source: Ambulatory Visit | Attending: Family Medicine | Admitting: Family Medicine

## 2010-11-29 DIAGNOSIS — Z363 Encounter for antenatal screening for malformations: Secondary | ICD-10-CM | POA: Insufficient documentation

## 2010-11-29 DIAGNOSIS — O358XX0 Maternal care for other (suspected) fetal abnormality and damage, not applicable or unspecified: Secondary | ICD-10-CM | POA: Insufficient documentation

## 2010-11-29 DIAGNOSIS — Z1389 Encounter for screening for other disorder: Secondary | ICD-10-CM | POA: Insufficient documentation

## 2010-11-29 DIAGNOSIS — Z3689 Encounter for other specified antenatal screening: Secondary | ICD-10-CM

## 2010-12-12 ENCOUNTER — Ambulatory Visit (INDEPENDENT_AMBULATORY_CARE_PROVIDER_SITE_OTHER): Payer: Medicaid Other | Admitting: Family Medicine

## 2010-12-12 VITALS — BP 92/65 | HR 101 | Temp 96.7°F | Wt 122.5 lb

## 2010-12-12 DIAGNOSIS — Z8679 Personal history of other diseases of the circulatory system: Secondary | ICD-10-CM

## 2010-12-12 DIAGNOSIS — O0990 Supervision of high risk pregnancy, unspecified, unspecified trimester: Secondary | ICD-10-CM

## 2010-12-12 DIAGNOSIS — I359 Nonrheumatic aortic valve disorder, unspecified: Secondary | ICD-10-CM

## 2010-12-12 LAB — POCT URINALYSIS DIP (DEVICE)
Bilirubin Urine: NEGATIVE
Glucose, UA: NEGATIVE mg/dL
Ketones, ur: NEGATIVE mg/dL
Leukocytes, UA: NEGATIVE
Nitrite: NEGATIVE

## 2010-12-12 NOTE — Patient Instructions (Signed)
Pregnancy - Second Trimester The second trimester of pregnancy (3 to 6 months) is a period of rapid growth for you and your baby. At the end of the sixth month, your baby is about 9 inches long and weighs 1 1/2 pounds. You will begin to feel the baby move between 18 and 20 weeks of the pregnancy. This is called quickening. Weight gain is faster. A clear fluid (colostrum) may leak out of your breasts. You may feel small contractions of the womb (uterus). This is known as false labor or Braxton-Hicks contractions. This is like a practice for labor when the baby is ready to be born. Usually, the problems with morning sickness have usually passed by the end of your first trimester. Some women develop small dark blotches (called cholasma, mask of pregnancy) on their face that usually goes away after the baby is born. Exposure to the sun makes the blotches worse. Acne may also develop in some pregnant women and pregnant women who have acne, may find that it goes away. PRENATAL EXAMS  Blood work may continue to be done during prenatal exams. These tests are done to check on your health and the probable health of your baby. Blood work is used to follow your blood levels (hemoglobin). Anemia (low hemoglobin) is common during pregnancy. Iron and vitamins are given to help prevent this. You will also be checked for diabetes between 24 and 28 weeks of the pregnancy. Some of the previous blood tests may be repeated.   The size of the uterus is measured during each visit. This is to make sure that the baby is continuing to grow properly according to the dates of the pregnancy.   Your blood pressure is checked every prenatal visit. This is to make sure you are not getting toxemia.   Your urine is checked to make sure you do not have an infection, diabetes or protein in the urine.   Your weight is checked often to make sure gains are happening at the suggested rate. This is to ensure that both you and your baby are  growing normally.   Sometimes, an ultrasound is performed to confirm the proper growth and development of the baby. This is a test which bounces harmless sound waves off the baby so your caregiver can more accurately determine due dates.  Sometimes, a specialized test is done on the amniotic fluid surrounding the baby. This test is called an amniocentesis. The amniotic fluid is obtained by sticking a needle into the belly (abdomen). This is done to check the chromosomes in instances where there is a concern about possible genetic problems with the baby. It is also sometimes done near the end of pregnancy if an early delivery is required. In this case, it is done to help make sure the baby's lungs are mature enough for the baby to live outside of the womb. CHANGES OCCURING IN THE SECOND TRIMESTER OF PREGNANCY Your body goes through many changes during pregnancy. They vary from person to person. Talk to your caregiver about changes you notice that you are concerned about.  During the second trimester, you will likely have an increase in your appetite. It is normal to have cravings for certain foods. This varies from person to person and pregnancy to pregnancy.   Your lower abdomen will begin to bulge.   You may have to urinate more often because the uterus and baby are pressing on your bladder. It is also common to get more bladder infections during pregnancy (  pain with urination). You can help this by drinking lots of fluids and emptying your bladder before and after intercourse.   You may begin to get stretch marks on your hips, abdomen, and breasts. These are normal changes in the body during pregnancy. There are no exercises or medications to take that prevent this change.   You may begin to develop swollen and bulging veins (varicose veins) in your legs. Wearing support hose, elevating your feet for 15 minutes, 3 to 4 times a day and limiting salt in your diet helps lessen the problem.    Heartburn may develop as the uterus grows and pushes up against the stomach. Antacids recommended by your caregiver helps with this problem. Also, eating smaller meals 4 to 5 times a day helps.   Constipation can be treated with a stool softener or adding bulk to your diet. Drinking lots of fluids, vegetables, fruits, and whole grains are helpful.   Exercising is also helpful. If you have been very active up until your pregnancy, most of these activities can be continued during your pregnancy. If you have been less active, it is helpful to start an exercise program such as walking.   Hemorrhoids (varicose veins in the rectum) may develop at the end of the second trimester. Warm sitz baths and hemorrhoid cream recommended by your caregiver helps hemorrhoid problems.   Backaches may develop during this time of your pregnancy. Avoid heavy lifting, wear low heal shoes and practice good posture to help with backache problems.   Some pregnant women develop tingling and numbness of their hand and fingers because of swelling and tightening of ligaments in the wrist (carpel tunnel syndrome). This goes away after the baby is born.   As your breasts enlarge, you may have to get a bigger bra. Get a comfortable, cotton, support bra. Do not get a nursing bra until the last month of the pregnancy if you will be nursing the baby.   You may get a dark line from your belly button to the pubic area called the linea nigra.   You may develop rosy cheeks because of increase blood flow to the face.   You may develop spider looking lines of the face, neck, arms and chest. These go away after the baby is born.  HOME CARE INSTRUCTIONS   It is extremely important to avoid all smoking, herbs, alcohol, and unprescribed drugs during your pregnancy. These chemicals affect the formation and growth of the baby. Avoid these chemicals throughout the pregnancy to ensure the delivery of a healthy infant.   Most of your home  care instructions are the same as suggested for the first trimester of your pregnancy. Keep your caregiver's appointments. Follow your caregiver's instructions regarding medication use, exercise and diet.   During pregnancy, you are providing food for you and your baby. Continue to eat regular, well-balanced meals. Choose foods such as meat, fish, milk and other low fat dairy products, vegetables, fruits, and whole-grain breads and cereals. Your caregiver will tell you of the ideal weight gain.   A physical sexual relationship may be continued up until near the end of pregnancy if there are no other problems. Problems could include early (premature) leaking of amniotic fluid from the membranes, vaginal bleeding, abdominal pain, or other medical or pregnancy problems.   Exercise regularly if there are no restrictions. Check with your caregiver if you are unsure of the safety of some of your exercises. The greatest weight gain will occur in the   last 2 trimesters of pregnancy. Exercise will help you:   Control your weight.   Get you in shape for labor and delivery.   Lose weight after you have the baby.   Wear a good support or jogging bra for breast tenderness during pregnancy. This may help if worn during sleep. Pads or tissues may be used in the bra if you are leaking colostrum.   Do not use hot tubs, steam rooms or saunas throughout the pregnancy.   Wear your seat belt at all times when driving. This protects you and your baby if you are in an accident.   Avoid raw meat, uncooked cheese, cat litter boxes and soil used by cats. These carry germs that can cause birth defects in the baby.   The second trimester is also a good time to visit your dentist for your dental health if this has not been done yet. Getting your teeth cleaned is OK. Use a soft toothbrush. Brush gently during pregnancy.   It is easier to loose urine during pregnancy. Tightening up and strengthening the pelvic muscles will  help with this problem. Practice stopping your urination while you are going to the bathroom. These are the same muscles you need to strengthen. It is also the muscles you would use as if you were trying to stop from passing gas. You can practice tightening these muscles up 10 times a set and repeating this about 3 times per day. Once you know what muscles to tighten up, do not perform these exercises during urination. It is more likely to contribute to an infection by backing up the urine.   Ask for help if you have financial, counseling or nutritional needs during pregnancy. Your caregiver will be able to offer counseling for these needs as well as refer you for other special needs.   Your skin may become oily. If so, wash your face with mild soap, use non-greasy moisturizer and oil or cream based makeup.  MEDICATIONS AND DRUG USE IN PREGNANCY  Take prenatal vitamins as directed. The vitamin should contain 1 milligram of folic acid. Keep all vitamins out of reach of children. Only a couple vitamins or tablets containing iron may be fatal to a baby or young child when ingested.   Avoid use of all medications, including herbs, over-the-counter medications, not prescribed or suggested by your caregiver. Only take over-the-counter or prescription medicines for pain, discomfort, or fever as directed by your caregiver. Do not use aspirin.   Let your caregiver also know about herbs you may be using.   Alcohol is related to a number of birth defects. This includes fetal alcohol syndrome. All alcohol, in any form, should be avoided completely. Smoking will cause low birth rate and premature babies.   Street or illegal drugs are very harmful to the baby. They are absolutely forbidden. A baby born to an addicted mother will be addicted at birth. The baby will go through the same withdrawal an adult does.  SEEK MEDICAL CARE IF:  You have any concerns or worries during your pregnancy. It is better to call with  your questions if you feel they cannot wait, rather than worry about them. SEEK IMMEDIATE MEDICAL CARE IF:   An unexplained oral temperature above 102 F (38.9 C) develops, or as your caregiver suggests.   You have leaking of fluid from the vagina (birth canal). If leaking membranes are suspected, take your temperature and tell your caregiver of this when you call.   There   is vaginal spotting, bleeding, or passing clots. Tell your caregiver of the amount and how many pads are used. Light spotting in pregnancy is common, especially following intercourse.   You develop a bad smelling vaginal discharge with a change in the color from clear to white.   You continue to feel sick to your stomach (nauseated) and have no relief from remedies suggested. You vomit blood or coffee ground-like materials.   You lose more than 2 pounds of weight or gain more than 2 pounds of weight over 1 week, or as suggested by your caregiver.   You notice swelling of your face, hands, feet, or legs.   You get exposed to German measles and have never had them.   You are exposed to fifth disease or chickenpox.   You develop belly (abdominal) pain. Round ligament discomfort is a common non-cancerous (benign) cause of abdominal pain in pregnancy. Your caregiver still must evaluate you.   You develop a bad headache that does not go away.   You develop fever, diarrhea, pain with urination, or shortness of breath.   You develop visual problems, blurry, or double vision.   You fall or are in a car accident or any kind of trauma.   There is mental or physical violence at home.  Document Released: 01/24/2001 Document Revised: 10/12/2010 Document Reviewed: 07/29/2008 ExitCare Patient Information 2012 [employer]. 

## 2010-12-12 NOTE — Progress Notes (Signed)
Subjective:    Patricia Frederick is a 20 y.o. female being seen today for her obstetrical visit. She is at [redacted]w[redacted]d gestation. Patient reports no bleeding, no contractions, no leaking and continues to have some chest pain and SOB with moving around and doing things quickly; states it resolves with rest. Fetal movement: normal. Had her echocardiogram on Oct 17, did not see the doctor at that time. She is unclear if she is supposed to see a doctor or not. She does not seem aware of any procedure to be done to treat her condition (ie balloon procedure mentioned in earlier notes).   Menstrual History: OB History    Grav Para Term Preterm Abortions TAB SAB Ect Mult Living   2 1 1       1       Objective:    BP 92/65  Pulse 101  Temp 96.7 F (35.9 C)  Wt 122 lb 8 oz (55.566 kg) FHT: 155 BPM  Uterine Size: 20 cm    General: Awake, alert, oriented, poor eye contact Heart: iv/vi aortic murmur, radiates into the RT carotid Lungs: CTA B/L  Assessment:    Pregnancy 20 and 1/7 weeks   Plan:    Signs and symptoms of preterm labor: discussed. Appointment made for her to be seen by Dr Anner Crete on Nov 29; she missed her appointment on Oct 5. Will arrange for her to have medicaid transportation to her appointment.  Follow up in 4 weeks.

## 2010-12-12 NOTE — Progress Notes (Signed)
Patient has an appt. With Dr. Anner Crete (cardiology) at [facility] on Nov. 29, 2012 at 930 am. Medicaid transportation called and needs confirmation of patient arrival at [facility] appt. At [facility] on Oct. 17th. Medicaid transportation will fax a form to obtain this so transportation can be arranged for the Nov. Appt. Transportation cannot be scheduled prior to Nov. 15th.

## 2011-01-09 ENCOUNTER — Ambulatory Visit (INDEPENDENT_AMBULATORY_CARE_PROVIDER_SITE_OTHER): Payer: Medicaid Other | Admitting: Family Medicine

## 2011-01-09 VITALS — BP 96/71 | Temp 98.6°F | Wt 127.2 lb

## 2011-01-09 DIAGNOSIS — I350 Nonrheumatic aortic (valve) stenosis: Secondary | ICD-10-CM

## 2011-01-09 DIAGNOSIS — I359 Nonrheumatic aortic valve disorder, unspecified: Secondary | ICD-10-CM

## 2011-01-09 DIAGNOSIS — O0990 Supervision of high risk pregnancy, unspecified, unspecified trimester: Secondary | ICD-10-CM

## 2011-01-09 LAB — POCT URINALYSIS DIP (DEVICE)
Bilirubin Urine: NEGATIVE
Glucose, UA: NEGATIVE mg/dL
Ketones, ur: NEGATIVE mg/dL
Specific Gravity, Urine: 1.02 (ref 1.005–1.030)

## 2011-01-09 MED ORDER — NATALCARE PIC 60-1 MG PO TABS: 1.0000 | ORAL_TABLET | Freq: Every day | ORAL | Status: DC

## 2011-01-09 NOTE — Progress Notes (Signed)
Needs refill on Pre-natals, ECHO from [facility] does not look as severe, need to arrange transportation to Cardiology visit on 11/29. F/U with MFM-[facility] for growth in 3-4 wks.

## 2011-01-09 NOTE — Progress Notes (Signed)
Used [employer] interpreter # [phone] Pulse 106. No vaginal discharge.

## 2011-01-09 NOTE — Progress Notes (Signed)
[facility] scheduled 01/30/11 at 3pm at [facility] with Int. # [id number]. Pt. States she does not need Medicaid transportation to the appt. Patient advised that Medicaid transportation will pick her up between 525 am and 605 am on 01/12/11 for an appt. with Dr. Wells/cardiologist at [facility] for a 930 am appt. Medicaid is aware pt.'s sister will go with her.

## 2011-01-09 NOTE — Patient Instructions (Signed)
Pregnancy - Second Trimester The second trimester of pregnancy (3 to 6 months) is a period of rapid growth for you and your baby. At the end of the sixth month, your baby is about 9 inches long and weighs 1 1/2 pounds. You will begin to feel the baby move between 18 and 20 weeks of the pregnancy. This is called quickening. Weight gain is faster. A clear fluid (colostrum) may leak out of your breasts. You may feel small contractions of the womb (uterus). This is known as false labor or Braxton-Hicks contractions. This is like a practice for labor when the baby is ready to be born. Usually, the problems with morning sickness have usually passed by the end of your first trimester. Some women develop small dark blotches (called cholasma, mask of pregnancy) on their face that usually goes away after the baby is born. Exposure to the sun makes the blotches worse. Acne may also develop in some pregnant women and pregnant women who have acne, may find that it goes away. PRENATAL EXAMS  Blood work may continue to be done during prenatal exams. These tests are done to check on your health and the probable health of your baby. Blood work is used to follow your blood levels (hemoglobin). Anemia (low hemoglobin) is common during pregnancy. Iron and vitamins are given to help prevent this. You will also be checked for diabetes between 24 and 28 weeks of the pregnancy. Some of the previous blood tests may be repeated.   The size of the uterus is measured during each visit. This is to make sure that the baby is continuing to grow properly according to the dates of the pregnancy.   Your blood pressure is checked every prenatal visit. This is to make sure you are not getting toxemia.   Your urine is checked to make sure you do not have an infection, diabetes or protein in the urine.   Your weight is checked often to make sure gains are happening at the suggested rate. This is to ensure that both you and your baby are  growing normally.   Sometimes, an ultrasound is performed to confirm the proper growth and development of the baby. This is a test which bounces harmless sound waves off the baby so your caregiver can more accurately determine due dates.  Sometimes, a specialized test is done on the amniotic fluid surrounding the baby. This test is called an amniocentesis. The amniotic fluid is obtained by sticking a needle into the belly (abdomen). This is done to check the chromosomes in instances where there is a concern about possible genetic problems with the baby. It is also sometimes done near the end of pregnancy if an early delivery is required. In this case, it is done to help make sure the baby's lungs are mature enough for the baby to live outside of the womb. CHANGES OCCURING IN THE SECOND TRIMESTER OF PREGNANCY Your body goes through many changes during pregnancy. They vary from person to person. Talk to your caregiver about changes you notice that you are concerned about.  During the second trimester, you will likely have an increase in your appetite. It is normal to have cravings for certain foods. This varies from person to person and pregnancy to pregnancy.   Your lower abdomen will begin to bulge.   You may have to urinate more often because the uterus and baby are pressing on your bladder. It is also common to get more bladder infections during pregnancy (  pain with urination). You can help this by drinking lots of fluids and emptying your bladder before and after intercourse.   You may begin to get stretch marks on your hips, abdomen, and breasts. These are normal changes in the body during pregnancy. There are no exercises or medications to take that prevent this change.   You may begin to develop swollen and bulging veins (varicose veins) in your legs. Wearing support hose, elevating your feet for 15 minutes, 3 to 4 times a day and limiting salt in your diet helps lessen the problem.    Heartburn may develop as the uterus grows and pushes up against the stomach. Antacids recommended by your caregiver helps with this problem. Also, eating smaller meals 4 to 5 times a day helps.   Constipation can be treated with a stool softener or adding bulk to your diet. Drinking lots of fluids, vegetables, fruits, and whole grains are helpful.   Exercising is also helpful. If you have been very active up until your pregnancy, most of these activities can be continued during your pregnancy. If you have been less active, it is helpful to start an exercise program such as walking.   Hemorrhoids (varicose veins in the rectum) may develop at the end of the second trimester. Warm sitz baths and hemorrhoid cream recommended by your caregiver helps hemorrhoid problems.   Backaches may develop during this time of your pregnancy. Avoid heavy lifting, wear low heal shoes and practice good posture to help with backache problems.   Some pregnant women develop tingling and numbness of their hand and fingers because of swelling and tightening of ligaments in the wrist (carpel tunnel syndrome). This goes away after the baby is born.   As your breasts enlarge, you may have to get a bigger bra. Get a comfortable, cotton, support bra. Do not get a nursing bra until the last month of the pregnancy if you will be nursing the baby.   You may get a dark line from your belly button to the pubic area called the linea nigra.   You may develop rosy cheeks because of increase blood flow to the face.   You may develop spider looking lines of the face, neck, arms and chest. These go away after the baby is born.  HOME CARE INSTRUCTIONS   It is extremely important to avoid all smoking, herbs, alcohol, and unprescribed drugs during your pregnancy. These chemicals affect the formation and growth of the baby. Avoid these chemicals throughout the pregnancy to ensure the delivery of a healthy infant.   Most of your home  care instructions are the same as suggested for the first trimester of your pregnancy. Keep your caregiver's appointments. Follow your caregiver's instructions regarding medication use, exercise and diet.   During pregnancy, you are providing food for you and your baby. Continue to eat regular, well-balanced meals. Choose foods such as meat, fish, milk and other low fat dairy products, vegetables, fruits, and whole-grain breads and cereals. Your caregiver will tell you of the ideal weight gain.   A physical sexual relationship may be continued up until near the end of pregnancy if there are no other problems. Problems could include early (premature) leaking of amniotic fluid from the membranes, vaginal bleeding, abdominal pain, or other medical or pregnancy problems.   Exercise regularly if there are no restrictions. Check with your caregiver if you are unsure of the safety of some of your exercises. The greatest weight gain will occur in the   last 2 trimesters of pregnancy. Exercise will help you:   Control your weight.   Get you in shape for labor and delivery.   Lose weight after you have the baby.   Wear a good support or jogging bra for breast tenderness during pregnancy. This may help if worn during sleep. Pads or tissues may be used in the bra if you are leaking colostrum.   Do not use hot tubs, steam rooms or saunas throughout the pregnancy.   Wear your seat belt at all times when driving. This protects you and your baby if you are in an accident.   Avoid raw meat, uncooked cheese, cat litter boxes and soil used by cats. These carry germs that can cause birth defects in the baby.   The second trimester is also a good time to visit your dentist for your dental health if this has not been done yet. Getting your teeth cleaned is OK. Use a soft toothbrush. Brush gently during pregnancy.   It is easier to loose urine during pregnancy. Tightening up and strengthening the pelvic muscles will  help with this problem. Practice stopping your urination while you are going to the bathroom. These are the same muscles you need to strengthen. It is also the muscles you would use as if you were trying to stop from passing gas. You can practice tightening these muscles up 10 times a set and repeating this about 3 times per day. Once you know what muscles to tighten up, do not perform these exercises during urination. It is more likely to contribute to an infection by backing up the urine.   Ask for help if you have financial, counseling or nutritional needs during pregnancy. Your caregiver will be able to offer counseling for these needs as well as refer you for other special needs.   Your skin may become oily. If so, wash your face with mild soap, use non-greasy moisturizer and oil or cream based makeup.  MEDICATIONS AND DRUG USE IN PREGNANCY  Take prenatal vitamins as directed. The vitamin should contain 1 milligram of folic acid. Keep all vitamins out of reach of children. Only a couple vitamins or tablets containing iron may be fatal to a baby or young child when ingested.   Avoid use of all medications, including herbs, over-the-counter medications, not prescribed or suggested by your caregiver. Only take over-the-counter or prescription medicines for pain, discomfort, or fever as directed by your caregiver. Do not use aspirin.   Let your caregiver also know about herbs you may be using.   Alcohol is related to a number of birth defects. This includes fetal alcohol syndrome. All alcohol, in any form, should be avoided completely. Smoking will cause low birth rate and premature babies.   Street or illegal drugs are very harmful to the baby. They are absolutely forbidden. A baby born to an addicted mother will be addicted at birth. The baby will go through the same withdrawal an adult does.  SEEK MEDICAL CARE IF:  You have any concerns or worries during your pregnancy. It is better to call with  your questions if you feel they cannot wait, rather than worry about them. SEEK IMMEDIATE MEDICAL CARE IF:   An unexplained oral temperature above 102 F (38.9 C) develops, or as your caregiver suggests.   You have leaking of fluid from the vagina (birth canal). If leaking membranes are suspected, take your temperature and tell your caregiver of this when you call.   There   is vaginal spotting, bleeding, or passing clots. Tell your caregiver of the amount and how many pads are used. Light spotting in pregnancy is common, especially following intercourse.   You develop a bad smelling vaginal discharge with a change in the color from clear to white.   You continue to feel sick to your stomach (nauseated) and have no relief from remedies suggested. You vomit blood or coffee ground-like materials.   You lose more than 2 pounds of weight or gain more than 2 pounds of weight over 1 week, or as suggested by your caregiver.   You notice swelling of your face, hands, feet, or legs.   You get exposed to German measles and have never had them.   You are exposed to fifth disease or chickenpox.   You develop belly (abdominal) pain. Round ligament discomfort is a common non-cancerous (benign) cause of abdominal pain in pregnancy. Your caregiver still must evaluate you.   You develop a bad headache that does not go away.   You develop fever, diarrhea, pain with urination, or shortness of breath.   You develop visual problems, blurry, or double vision.   You fall or are in a car accident or any kind of trauma.   There is mental or physical violence at home.  Document Released: 01/24/2001 Document Revised: 10/12/2010 Document Reviewed: 07/29/2008 ExitCare Patient Information 2012 [employer]. 

## 2011-01-23 ENCOUNTER — Ambulatory Visit (INDEPENDENT_AMBULATORY_CARE_PROVIDER_SITE_OTHER): Payer: Medicaid Other | Admitting: Physician Assistant

## 2011-01-23 DIAGNOSIS — O0990 Supervision of high risk pregnancy, unspecified, unspecified trimester: Secondary | ICD-10-CM

## 2011-01-23 DIAGNOSIS — I359 Nonrheumatic aortic valve disorder, unspecified: Secondary | ICD-10-CM

## 2011-01-23 DIAGNOSIS — I350 Nonrheumatic aortic (valve) stenosis: Secondary | ICD-10-CM

## 2011-01-23 DIAGNOSIS — Z8679 Personal history of other diseases of the circulatory system: Secondary | ICD-10-CM

## 2011-01-23 LAB — POCT URINALYSIS DIP (DEVICE)
Bilirubin Urine: NEGATIVE
Glucose, UA: NEGATIVE mg/dL
Hgb urine dipstick: NEGATIVE
Specific Gravity, Urine: 1.02 (ref 1.005–1.030)
Urobilinogen, UA: 0.2 mg/dL (ref 0.0–1.0)

## 2011-01-23 NOTE — Progress Notes (Signed)
C/o occassional SOB with exertion and singing in church. Denies CP or increased edema. Records unavailable from 11/29 visit with Dr. Anner Crete. Needs f/u fetal US with MFM and fetal ECHO scheduled. Needs delivery plan with MFM given likely delivery at [facility]. Will obtain [facility] Cards notes from 11/29

## 2011-01-23 NOTE — Progress Notes (Signed)
Pulse 109. No vaginal discharge. Used interpreter Alvira Philips

## 2011-01-23 NOTE — Progress Notes (Signed)
MFM consult scheduled for 11/30/10 at 10am.  Fetal Echo scheduled with Dr. Elizebeth Brooking for 01/30/11 at 2:30 pm.

## 2011-01-30 ENCOUNTER — Ambulatory Visit (HOSPITAL_COMMUNITY)
Admission: RE | Admit: 2011-01-30 | Discharge: 2011-01-30 | Disposition: A | Discharge status: Home / Self Care | Payer: Medicaid Other | Source: Ambulatory Visit | Attending: Family Medicine | Admitting: Family Medicine

## 2011-01-30 ENCOUNTER — Ambulatory Visit (HOSPITAL_COMMUNITY): Payer: Medicaid Other

## 2011-01-30 ENCOUNTER — Other Ambulatory Visit: Payer: Self-pay | Admitting: Family Medicine

## 2011-01-30 ENCOUNTER — Ambulatory Visit (INDEPENDENT_AMBULATORY_CARE_PROVIDER_SITE_OTHER): Payer: Medicaid Other | Admitting: Obstetrics & Gynecology

## 2011-01-30 VITALS — BP 100/73 | Temp 98.5°F | Wt 132.6 lb

## 2011-01-30 DIAGNOSIS — O99891 Other specified diseases and conditions complicating pregnancy: Secondary | ICD-10-CM | POA: Insufficient documentation

## 2011-01-30 DIAGNOSIS — I350 Nonrheumatic aortic (valve) stenosis: Secondary | ICD-10-CM

## 2011-01-30 DIAGNOSIS — R000 Tachycardia, unspecified: Secondary | ICD-10-CM

## 2011-01-30 DIAGNOSIS — Z8679 Personal history of other diseases of the circulatory system: Secondary | ICD-10-CM

## 2011-01-30 DIAGNOSIS — O0990 Supervision of high risk pregnancy, unspecified, unspecified trimester: Secondary | ICD-10-CM

## 2011-01-30 DIAGNOSIS — I359 Nonrheumatic aortic valve disorder, unspecified: Secondary | ICD-10-CM | POA: Insufficient documentation

## 2011-01-30 DIAGNOSIS — Z3689 Encounter for other specified antenatal screening: Secondary | ICD-10-CM | POA: Insufficient documentation

## 2011-01-30 DIAGNOSIS — Z609 Problem related to social environment, unspecified: Secondary | ICD-10-CM

## 2011-01-30 DIAGNOSIS — Z603 Acculturation difficulty: Secondary | ICD-10-CM

## 2011-01-30 LAB — CBC
Hemoglobin: 10.3 g/dL — ABNORMAL LOW (ref 12.0–15.0)
RBC: 4.07 MIL/uL (ref 3.87–5.11)

## 2011-01-30 LAB — POCT URINALYSIS DIP (DEVICE)
Bilirubin Urine: NEGATIVE
Glucose, UA: 250 mg/dL — AB
Hgb urine dipstick: NEGATIVE
Leukocytes, UA: NEGATIVE
Nitrite: NEGATIVE

## 2011-01-30 LAB — GLUCOSE TOLERANCE, 1 HOUR: Glucose, 1 Hour GTT: 122 mg/dL (ref 70–140)

## 2011-01-30 NOTE — Patient Instructions (Signed)
Breastfeeding BENEFITS OF BREASTFEEDING For the baby  The first milk (colostrum) helps the baby's digestive system function better.   There are antibodies from the mother in the milk that help the baby fight off infections.   The baby has a lower incidence of asthma, allergies, and SIDS (sudden infant death syndrome).   The nutrients in breast milk are better than formulas for the baby and helps the baby's brain grow better.   Babies who breastfeed have less gas, colic, and constipation.  For the mother  Breastfeeding helps develop a very special bond between mother and baby.   It is more convenient, always available at the correct temperature and cheaper than formula feeding.   It burns calories in the mother and helps with losing weight that was gained during pregnancy.   It makes the uterus contract back down to normal size faster and slows bleeding following delivery.   Breastfeeding mothers have a lower risk of developing breast cancer.  NURSE FREQUENTLY  A healthy, full-term baby may breastfeed as often as every hour or space his or her feedings to every 3 hours.   How often to nurse will vary from baby to baby. Watch your baby for signs of hunger, not the clock.   Nurse as often as the baby requests, or when you feel the need to reduce the fullness of your breasts.   Awaken the baby if it has been 3 to 4 hours since the last feeding.   Frequent feeding will help the mother make more milk and will prevent problems like sore nipples and engorgement of the breasts.  BABY'S POSITION AT THE BREAST  Whether lying down or sitting, be sure that the baby's tummy is facing your tummy.   Support the breast with 4 fingers underneath the breast and the thumb above. Make sure your fingers are well away from the nipple and baby's mouth.   Stroke the baby's lips and cheek closest to the breast gently with your finger or nipple.   When the baby's mouth is open wide enough, place all  of your nipple and as much of the dark area around the nipple as possible into your baby's mouth.   Pull the baby in close so the tip of the nose and the baby's cheeks touch the breast during the feeding.  FEEDINGS  The length of each feeding varies from baby to baby and from feeding to feeding.   The baby must suck about 2 to 3 minutes for your milk to get to him or her. This is called a "let down." For this reason, allow the baby to feed on each breast as long as he or she wants. Your baby will end the feeding when he or she has received the right balance of nutrients.   To break the suction, put your finger into the corner of the baby's mouth and slide it between his or her gums before removing your breast from his or her mouth. This will help prevent sore nipples.  REDUCING BREAST ENGORGEMENT  In the first week after your baby is born, you may experience signs of breast engorgement. When breasts are engorged, they feel heavy, warm, full, and may be tender to the touch. You can reduce engorgement if you:   Nurse frequently, every 2 to 3 hours. Mothers who breastfeed early and often have fewer problems with engorgement.   Place light ice packs on your breasts between feedings. This reduces swelling. Wrap the ice packs in a   lightweight towel to protect your skin.   Apply moist hot packs to your breast for 5 to 10 minutes before each feeding. This increases circulation and helps the milk flow.   Gently massage your breast before and during the feeding.   Make sure that the baby empties at least one breast at every feeding before switching sides.   Use a breast pump to empty the breasts if your baby is sleepy or not nursing well. You may also want to pump if you are returning to work or or you feel you are getting engorged.   Avoid bottle feeds, pacifiers or supplemental feedings of water or juice in place of breastfeeding.   Be sure the baby is latched on and positioned properly while  breastfeeding.   Prevent fatigue, stress, and anemia.   Wear a supportive bra, avoiding underwire styles.   Eat a balanced diet with enough fluids.  If you follow these suggestions, your engorgement should improve in 24 to 48 hours. If you are still experiencing difficulty, call your lactation consultant or caregiver. IS MY BABY GETTING ENOUGH MILK? Sometimes, mothers worry about whether their babies are getting enough milk. You can be assured that your baby is getting enough milk if:  The baby is actively sucking and you hear swallowing.   The baby nurses at least 8 to 12 times in a 24 hour time period. Nurse your baby until he or she unlatches or falls asleep at the first breast (at least 10 to 20 minutes), then offer the second side.   The baby is wetting 5 to 6 disposable diapers (6 to 8 cloth diapers) in a 24 hour period by 5 to 6 days of age.   The baby is having at least 2 to 3 stools every 24 hours for the first few months. Breast milk is all the food your baby needs. It is not necessary for your baby to have water or formula. In fact, to help your breasts make more milk, it is best not to give your baby supplemental feedings during the early weeks.   The stool should be soft and yellow.   The baby should gain 4 to 7 ounces per week after he is 4 days old.  TAKE CARE OF YOURSELF Take care of your breasts by:  Bathing or showering daily.   Avoiding the use of soaps on your nipples.   Start feedings on your left breast at one feeding and on your right breast at the next feeding.   You will notice an increase in your milk supply 2 to 5 days after delivery. You may feel some discomfort from engorgement, which makes your breasts very firm and often tender. Engorgement "peaks" out within 24 to 48 hours. In the meantime, apply warm moist towels to your breasts for 5 to 10 minutes before feeding. Gentle massage and expression of some milk before feeding will soften your breasts, making  it easier for your baby to latch on. Wear a well fitting nursing bra and air dry your nipples for 10 to 15 minutes after each feeding.   Only use cotton bra pads.   Only use pure lanolin on your nipples after nursing. You do not need to wash it off before nursing.  Take care of yourself by:   Eating well-balanced meals and nutritious snacks.   Drinking milk, fruit juice, and water to satisfy your thirst (about 8 glasses a day).   Getting plenty of rest.   Increasing calcium in   your diet (1200 mg a day).   Avoiding foods that you notice affect the baby in a bad way.  SEEK MEDICAL CARE IF:   You have any questions or difficulty with breastfeeding.   You need help.   You have a hard, red, sore area on your breast, accompanied by a fever of 100.5 F (38.1 C) or more.   Your baby is too sleepy to eat well or is having trouble sleeping.   Your baby is wetting less than 6 diapers per day, by 6 days of age.   Your baby's skin or white part of his or her eyes is more yellow than it was in the hospital.   You feel depressed.  Document Released: 01/30/2005 Document Revised: 10/12/2010 Document Reviewed: 09/14/2008 [facility] Patient Information 2012 [place], [place].  Hand Washing Staying healthy is important to you and your entire family. Follow these easy, low-cost steps to help stop many infectious diseases before they happen. HOW TO [facility]  Wet your hands and apply liquid, bar, or powder soap.   Rub hands together vigorously to make a lather and scrub all surfaces. Be sure to clean between the fingers and around the nails.   Continue for 20 seconds! It takes that long for the soap and scrubbing action to dislodge and remove stubborn germs.   Rinse hands well under running water.   Dry your hands using a paper towel or air dryer.   If possible, use your paper towel or elbow to turn off the faucet. This will help avoid re-exposure to germs on the handle.  WHEN TO [facility] YOUR  HANDS  Before and after eating.   Before, during, and after handling or preparing food.   After contact with blood or body fluids (like vomit, nasal secretions, or saliva). This means washing after you blow your nose!   Before and after changing a diaper.   After you use the bathroom.   After handling animals, their toys, leashes, or waste.   After touching something that could be contaminated (such as a trash can, cleaning cloth, drain, or soil).   Before and after taking care of (dressing) a wound, giving medicine, or inserting contact lenses.   More often when someone in your home is sick.   Whenever your hands become soiled.  If soap and water are not available, use an alcohol-based wipe or hand gel. Keeping your hands clean is one of the best ways to keep from getting sick and spreading illnesses. Cleaning your hands gets rid of germs you pick up:  From other people.   From the surfaces you touch.   From the animals you come in contact with.  Document Released: 09/20/2004 Document Revised: 10/12/2010 Document Reviewed: 02/26/2008 [facility] Patient Information 2012 [place], [place].

## 2011-01-30 NOTE — Progress Notes (Signed)
Denies any cardiac symptoms. Third trimester labs and 1 hr GTT today, had 250 of glucose on urine dipstick.  No other complaints or concerns.  Fetal movement and labor precautions reviewed.

## 2011-01-30 NOTE — Progress Notes (Signed)
Used Language Line: Nepali # [phone].  Needs refill on PNV 28 week labs and 1 hr gtt today, also needs urine culture

## 2011-01-31 ENCOUNTER — Encounter: Payer: Self-pay | Admitting: Obstetrics & Gynecology

## 2011-01-31 LAB — RPR

## 2011-02-01 LAB — CULTURE, OB URINE: Organism ID, Bacteria: NO GROWTH

## 2011-02-13 ENCOUNTER — Ambulatory Visit (INDEPENDENT_AMBULATORY_CARE_PROVIDER_SITE_OTHER): Payer: Medicaid Other | Admitting: Obstetrics & Gynecology

## 2011-02-13 DIAGNOSIS — I2510 Atherosclerotic heart disease of native coronary artery without angina pectoris: Secondary | ICD-10-CM

## 2011-02-13 DIAGNOSIS — Z609 Problem related to social environment, unspecified: Secondary | ICD-10-CM

## 2011-02-13 DIAGNOSIS — O0990 Supervision of high risk pregnancy, unspecified, unspecified trimester: Secondary | ICD-10-CM

## 2011-02-13 DIAGNOSIS — Z603 Acculturation difficulty: Secondary | ICD-10-CM

## 2011-02-13 DIAGNOSIS — I350 Nonrheumatic aortic (valve) stenosis: Secondary | ICD-10-CM

## 2011-02-13 LAB — POCT URINALYSIS DIP (DEVICE)
Bilirubin Urine: NEGATIVE
Glucose, UA: NEGATIVE mg/dL
Hgb urine dipstick: NEGATIVE
Ketones, ur: NEGATIVE mg/dL
Nitrite: NEGATIVE
Protein, ur: NEGATIVE mg/dL
Specific Gravity, Urine: 1.025 (ref 1.005–1.030)
Urobilinogen, UA: 0.2 mg/dL (ref 0.0–1.0)
pH: 6.5 (ref 5.0–8.0)

## 2011-02-13 NOTE — Patient Instructions (Signed)
Breastfeeding BENEFITS OF BREASTFEEDING For the baby  The first milk (colostrum) helps the baby's digestive system function better.   There are antibodies from the mother in the milk that help the baby fight off infections.   The baby has a lower incidence of asthma, allergies, and SIDS (sudden infant death syndrome).   The nutrients in breast milk are better than formulas for the baby and helps the baby's brain grow better.   Babies who breastfeed have less gas, colic, and constipation.  For the mother  Breastfeeding helps develop a very special bond between mother and baby.   It is more convenient, always available at the correct temperature and cheaper than formula feeding.   It burns calories in the mother and helps with losing weight that was gained during pregnancy.   It makes the uterus contract back down to normal size faster and slows bleeding following delivery.   Breastfeeding mothers have a lower risk of developing breast cancer.  NURSE FREQUENTLY  A healthy, full-term baby may breastfeed as often as every hour or space his or her feedings to every 3 hours.   How often to nurse will vary from baby to baby. Watch your baby for signs of hunger, not the clock.   Nurse as often as the baby requests, or when you feel the need to reduce the fullness of your breasts.   Awaken the baby if it has been 3 to 4 hours since the last feeding.   Frequent feeding will help the mother make more milk and will prevent problems like sore nipples and engorgement of the breasts.  BABY'S POSITION AT THE BREAST  Whether lying down or sitting, be sure that the baby's tummy is facing your tummy.   Support the breast with 4 fingers underneath the breast and the thumb above. Make sure your fingers are well away from the nipple and baby's mouth.   Stroke the baby's lips and cheek closest to the breast gently with your finger or nipple.   When the baby's mouth is open wide enough, place all  of your nipple and as much of the dark area around the nipple as possible into your baby's mouth.   Pull the baby in close so the tip of the nose and the baby's cheeks touch the breast during the feeding.  FEEDINGS  The length of each feeding varies from baby to baby and from feeding to feeding.   The baby must suck about 2 to 3 minutes for your milk to get to him or her. This is called a "let down." For this reason, allow the baby to feed on each breast as long as he or she wants. Your baby will end the feeding when he or she has received the right balance of nutrients.   To break the suction, put your finger into the corner of the baby's mouth and slide it between his or her gums before removing your breast from his or her mouth. This will help prevent sore nipples.  REDUCING BREAST ENGORGEMENT  In the first week after your baby is born, you may experience signs of breast engorgement. When breasts are engorged, they feel heavy, warm, full, and may be tender to the touch. You can reduce engorgement if you:   Nurse frequently, every 2 to 3 hours. Mothers who breastfeed early and often have fewer problems with engorgement.   Place light ice packs on your breasts between feedings. This reduces swelling. Wrap the ice packs in a   lightweight towel to protect your skin.   Apply moist hot packs to your breast for 5 to 10 minutes before each feeding. This increases circulation and helps the milk flow.   Gently massage your breast before and during the feeding.   Make sure that the baby empties at least one breast at every feeding before switching sides.   Use a breast pump to empty the breasts if your baby is sleepy or not nursing well. You may also want to pump if you are returning to work or or you feel you are getting engorged.   Avoid bottle feeds, pacifiers or supplemental feedings of water or juice in place of breastfeeding.   Be sure the baby is latched on and positioned properly while  breastfeeding.   Prevent fatigue, stress, and anemia.   Wear a supportive bra, avoiding underwire styles.   Eat a balanced diet with enough fluids.  If you follow these suggestions, your engorgement should improve in 24 to 48 hours. If you are still experiencing difficulty, call your lactation consultant or caregiver. IS MY BABY GETTING ENOUGH MILK? Sometimes, mothers worry about whether their babies are getting enough milk. You can be assured that your baby is getting enough milk if:  The baby is actively sucking and you hear swallowing.   The baby nurses at least 8 to 12 times in a 24 hour time period. Nurse your baby until he or she unlatches or falls asleep at the first breast (at least 10 to 20 minutes), then offer the second side.   The baby is wetting 5 to 6 disposable diapers (6 to 8 cloth diapers) in a 24 hour period by 5 to 6 days of age.   The baby is having at least 2 to 3 stools every 24 hours for the first few months. Breast milk is all the food your baby needs. It is not necessary for your baby to have water or formula. In fact, to help your breasts make more milk, it is best not to give your baby supplemental feedings during the early weeks.   The stool should be soft and yellow.   The baby should gain 4 to 7 ounces per week after he is 4 days old.  TAKE CARE OF YOURSELF Take care of your breasts by:  Bathing or showering daily.   Avoiding the use of soaps on your nipples.   Start feedings on your left breast at one feeding and on your right breast at the next feeding.   You will notice an increase in your milk supply 2 to 5 days after delivery. You may feel some discomfort from engorgement, which makes your breasts very firm and often tender. Engorgement "peaks" out within 24 to 48 hours. In the meantime, apply warm moist towels to your breasts for 5 to 10 minutes before feeding. Gentle massage and expression of some milk before feeding will soften your breasts, making  it easier for your baby to latch on. Wear a well fitting nursing bra and air dry your nipples for 10 to 15 minutes after each feeding.   Only use cotton bra pads.   Only use pure lanolin on your nipples after nursing. You do not need to wash it off before nursing.  Take care of yourself by:   Eating well-balanced meals and nutritious snacks.   Drinking milk, fruit juice, and water to satisfy your thirst (about 8 glasses a day).   Getting plenty of rest.   Increasing calcium in   your diet (1200 mg a day).   Avoiding foods that you notice affect the baby in a bad way.  SEEK MEDICAL CARE IF:   You have any questions or difficulty with breastfeeding.   You need help.   You have a hard, red, sore area on your breast, accompanied by a fever of 100.5 F (38.1 C) or more.   Your baby is too sleepy to eat well or is having trouble sleeping.   Your baby is wetting less than 6 diapers per day, by 19 days of age.   Your baby's skin or white part of his or her eyes is more yellow than it was in the hospital.   You feel depressed.  Document Released: 01/30/2005 Document Revised: 10/12/2010 Document Reviewed: 09/14/2008 [facility] Patient Information 2012 [place], [place].   Sterilization, Women Sterilization is a surgical procedure. This surgery permanently prevents pregnancy in women. This can be done by tying (with or without cutting) the fallopian tubes or burning the tubes closed (tubal ligation). Tubal ligation blocks the tubes and prevents the egg from being fertilized by the sperm. Sterilization can be done by removing the ovaries that produce the egg (castration) as well. Sterilization is considered safe with very rare complications. It does not affect menstrual periods, sexual desire, or performance.  Since sterilization is considered permanent, you should not do it until you are sure you do not want to have more children. You and your partner should fully agree to have the procedure.  Your decision to have the procedure should not be made when you are in a stressful situation. This can include a loss of a pregnancy, illness or death of a spouse, or divorce. There are other means of preventing unwanted pregnancies that can be used until you are completely sure you want to be sterilized. Sterilization does not protect against sexually transmitted disease. Women who had a sterilization procedure and want it reversed must know that it requires an expensive and major operation. The reversal may not be successful and has a high rate of tubal (ectopic) pregnancy that can be dangerous and require surgery. There are several ways to perform a tubal sterlization:  Laparoscopy. The abdomen is filled with a gas to see the pelvic organs. Then, a tube with a light attached is inserted into the abdomen through 2 small incisions. The fallopian tubes are blocked with a ring, clip or electrocautery to burn closed the tubes. Then, the gas is released and the small incisions are closed.   Hysteroscopy. A tube with a light is inserted in the vagina, through the cervix and then into the uterus. A spring-like instrument is inserted into the opening of the fallopian tubes. The spring causes scaring and blocks the tubes. Other forms of contraception should be used for three months at which time an X-ray is done to be sure the tubes are blocked.   Minilaparotomy. This is done right after giving birth. A small incision is made under the belly button and the tubes are exposed. The tubes can then be burned, tied and/or cut.   Tubal ligation can be done during a Cesarean section.   Castration is a surgical procedure that removes both ovaries.  Tubal sterilization should be discussed with your caregiver to answer any concerns you or your partner might have. This meeting will help to decide for sure if the operation is safe for you and which procedure is the best one for you. You can change your mind and cancel the  surgery at any time. HOME CARE INSTRUCTIONS   Follow your caregivers instructions regarding diet, rest, work, social and sexual activities and follow up appointments.   Shoulder pain is common following a laparoscopy. The pain may be relieved by lying down flat.   Only take over-the-counter or prescription medicines for pain, discomfort or fever as directed by your caregiver.   You may use lozenges for throat discomfort.   Keep the incisions covered to prevent infection.  SEEK IMMEDIATE MEDICAL CARE IF:   You develop a temperature of 102 F (38.9 C), or as your caregiver suggests.   You become dizzy or faint.   You start to feel sick to your stomach (nausea) or throw up (vomit).   You develop abdominal pain not relieved with over-the-counter medications.   You have redness and puffiness (swelling) of the cut (incision).   You see pus draining from the incision.   You miss a menstrual period.  Document Released: 07/19/2007 Document Revised: 10/12/2010 Document Reviewed: 07/19/2007 [facility] Patient Information 2012 [place], [place].

## 2011-02-13 NOTE — Progress Notes (Signed)
No cardiac symptoms today. No other complaints or concerns. Signed BTS papers today.  Fetal movement and labor precautions reviewed.

## 2011-02-13 NOTE — Progress Notes (Signed)
Interpreter present for check in  

## 2011-02-15 ENCOUNTER — Encounter: Payer: Self-pay | Admitting: *Deleted

## 2011-02-15 DIAGNOSIS — I2510 Atherosclerotic heart disease of native coronary artery without angina pectoris: Secondary | ICD-10-CM | POA: Insufficient documentation

## 2011-02-15 DIAGNOSIS — O99419 Diseases of the circulatory system complicating pregnancy, unspecified trimester: Secondary | ICD-10-CM

## 2011-02-21 DIAGNOSIS — O0990 Supervision of high risk pregnancy, unspecified, unspecified trimester: Secondary | ICD-10-CM

## 2011-02-27 ENCOUNTER — Ambulatory Visit (INDEPENDENT_AMBULATORY_CARE_PROVIDER_SITE_OTHER): Payer: Medicaid Other | Admitting: Obstetrics & Gynecology

## 2011-02-27 ENCOUNTER — Ambulatory Visit (HOSPITAL_COMMUNITY)
Admission: RE | Admit: 2011-02-27 | Discharge: 2011-02-27 | Disposition: A | Discharge status: Home / Self Care | Payer: Medicaid Other | Source: Ambulatory Visit | Attending: Family Medicine | Admitting: Family Medicine

## 2011-02-27 ENCOUNTER — Other Ambulatory Visit: Payer: Self-pay | Admitting: Obstetrics & Gynecology

## 2011-02-27 DIAGNOSIS — Z8679 Personal history of other diseases of the circulatory system: Secondary | ICD-10-CM

## 2011-02-27 DIAGNOSIS — I350 Nonrheumatic aortic (valve) stenosis: Secondary | ICD-10-CM

## 2011-02-27 DIAGNOSIS — O99891 Other specified diseases and conditions complicating pregnancy: Secondary | ICD-10-CM | POA: Insufficient documentation

## 2011-02-27 DIAGNOSIS — I359 Nonrheumatic aortic valve disorder, unspecified: Secondary | ICD-10-CM

## 2011-02-27 DIAGNOSIS — O99419 Diseases of the circulatory system complicating pregnancy, unspecified trimester: Secondary | ICD-10-CM

## 2011-02-27 DIAGNOSIS — O0990 Supervision of high risk pregnancy, unspecified, unspecified trimester: Secondary | ICD-10-CM

## 2011-02-27 DIAGNOSIS — I2510 Atherosclerotic heart disease of native coronary artery without angina pectoris: Secondary | ICD-10-CM

## 2011-02-27 DIAGNOSIS — Z3689 Encounter for other specified antenatal screening: Secondary | ICD-10-CM | POA: Insufficient documentation

## 2011-02-27 LAB — POCT URINALYSIS DIP (DEVICE)
Bilirubin Urine: NEGATIVE
Glucose, UA: NEGATIVE mg/dL
Hgb urine dipstick: NEGATIVE
Ketones, ur: NEGATIVE mg/dL
Specific Gravity, Urine: 1.015 (ref 1.005–1.030)
pH: 7 (ref 5.0–8.0)

## 2011-02-27 MED ORDER — GNP PRENATAL VITAMINS 28-0.8 MG PO TABS: 1.0000 | ORAL_TABLET | ORAL | Status: DC

## 2011-02-27 MED ORDER — PRENATAL VITAMINS (DIS) PO TABS: 1.0000 | ORAL_TABLET | ORAL | Status: DC

## 2011-02-27 NOTE — Progress Notes (Signed)
Pulse 106. No vaginal discharge. Pt needs rx for prenatal vitamin. Used interpreter Alvira Philips.

## 2011-02-27 NOTE — Progress Notes (Signed)
[facility] transportation arranged to cardiology appt. On 03/10/11 at 145 pm with Dr. Anner Crete at [facility]. Bus will pick patient up between 840 am and 920 am.

## 2011-02-27 NOTE — Progress Notes (Signed)
Pt has rare shortness of breath and chest pressure.  None currently.  Pt has missed all appts with Dr. Anner Crete in [place] (cardiologist).  Pt has another appt 03/09/11 and is getting Medicaid transportation arranged today for that appt.  Pt was not aware that she should deliver at [facility].  The pt was very clearly told today that she should deliver there to have FM and cardiology in house.  Pt needs PNV and Rx given to take to health dept.  Have call into Dr. Sherrie George to make sure they are aware of pt.  Lungs are clear today and pt is ambulating without SOB.  No chest pain today.

## 2011-03-13 ENCOUNTER — Ambulatory Visit (INDEPENDENT_AMBULATORY_CARE_PROVIDER_SITE_OTHER): Payer: Medicaid Other | Admitting: Obstetrics and Gynecology

## 2011-03-13 DIAGNOSIS — I2510 Atherosclerotic heart disease of native coronary artery without angina pectoris: Secondary | ICD-10-CM

## 2011-03-13 DIAGNOSIS — I350 Nonrheumatic aortic (valve) stenosis: Secondary | ICD-10-CM

## 2011-03-13 DIAGNOSIS — O0990 Supervision of high risk pregnancy, unspecified, unspecified trimester: Secondary | ICD-10-CM

## 2011-03-13 DIAGNOSIS — I359 Nonrheumatic aortic valve disorder, unspecified: Secondary | ICD-10-CM

## 2011-03-13 LAB — POCT URINALYSIS DIP (DEVICE)
Hgb urine dipstick: NEGATIVE
Ketones, ur: NEGATIVE mg/dL
Protein, ur: NEGATIVE mg/dL
Specific Gravity, Urine: 1.02 (ref 1.005–1.030)

## 2011-03-13 NOTE — Progress Notes (Signed)
Used Editor, commissioning interpreter # [phone]

## 2011-03-13 NOTE — Progress Notes (Signed)
Patient doing well without complaints. Patient was seen by Dr. Anner Crete last week, will try to obtain records. Patient denies any chest pain or shortness of breath. Patient did not seem clear on delivery plan. It was explained to the patient again that she will deliver at 39 weeks on 3/11 at [facility]. Used interpreter [phone]. Will coordinate with MFM to confirm date and time of delivery in order to coordinate transportation to [place].

## 2011-03-13 NOTE — Progress Notes (Signed)
Telephone call made to Dr. Anner Crete office at [facility] [phone]. They verified patient kept 03/10/11 appt. Note faxed to their office to be added as primary care physicians. Notes requested on last visit.

## 2011-03-20 DIAGNOSIS — I350 Nonrheumatic aortic (valve) stenosis: Secondary | ICD-10-CM

## 2011-03-27 ENCOUNTER — Ambulatory Visit (INDEPENDENT_AMBULATORY_CARE_PROVIDER_SITE_OTHER): Payer: Medicaid Other | Admitting: Advanced Practice Midwife

## 2011-03-27 ENCOUNTER — Ambulatory Visit (HOSPITAL_COMMUNITY)
Admission: RE | Admit: 2011-03-27 | Discharge: 2011-03-27 | Disposition: A | Discharge status: Home / Self Care | Payer: Medicaid Other | Source: Ambulatory Visit | Attending: Family Medicine | Admitting: Family Medicine

## 2011-03-27 VITALS — BP 109/78 | Temp 98.0°F | Wt 139.1 lb

## 2011-03-27 DIAGNOSIS — Z8679 Personal history of other diseases of the circulatory system: Secondary | ICD-10-CM

## 2011-03-27 DIAGNOSIS — I350 Nonrheumatic aortic (valve) stenosis: Secondary | ICD-10-CM

## 2011-03-27 DIAGNOSIS — Z3689 Encounter for other specified antenatal screening: Secondary | ICD-10-CM | POA: Insufficient documentation

## 2011-03-27 DIAGNOSIS — I2510 Atherosclerotic heart disease of native coronary artery without angina pectoris: Secondary | ICD-10-CM

## 2011-03-27 DIAGNOSIS — O99419 Diseases of the circulatory system complicating pregnancy, unspecified trimester: Secondary | ICD-10-CM

## 2011-03-27 DIAGNOSIS — I359 Nonrheumatic aortic valve disorder, unspecified: Secondary | ICD-10-CM

## 2011-03-27 DIAGNOSIS — O0990 Supervision of high risk pregnancy, unspecified, unspecified trimester: Secondary | ICD-10-CM

## 2011-03-27 LAB — POCT URINALYSIS DIP (DEVICE)
Glucose, UA: NEGATIVE mg/dL
Hgb urine dipstick: NEGATIVE
Specific Gravity, Urine: 1.025 (ref 1.005–1.030)
Urobilinogen, UA: 1 mg/dL (ref 0.0–1.0)

## 2011-03-27 NOTE — Progress Notes (Signed)
Medicaid transportation arranged for cardiology appointment on Feb. 21, 2013 at 8 am. with Dr. Anner Crete at [facility]. Patient will be picked up at 525 am. Interpreter Subu advised and she will notify patient of time.

## 2011-03-27 NOTE — Progress Notes (Signed)
Sx same as at time of last cardiology appointment. Some SOB w/ cleaning house, prolonged standing. Advised to increase rest. Go to hospital for increased Sx. Will send message to MFM to make arrangements to schedule IOL for 39 weeks at [facility] and make records available. GBS at [place].

## 2011-03-27 NOTE — Patient Instructions (Signed)
Pregnancy - Third Trimester The third trimester of pregnancy (the last 3 months) is a period of the most rapid growth for you and your baby. The baby approaches a length of 20 inches and a weight of 6 to 10 pounds. The baby is adding on fat and getting ready for life outside your body. While inside, babies have periods of sleeping and waking, suck their thumbs, and hiccups. You can often feel small contractions of the uterus. This is false labor. It is also called Braxton-Hicks contractions. This is like a practice for labor. The usual problems in this stage of pregnancy include more difficulty breathing, swelling of the hands and feet from water retention, and having to urinate more often because of the uterus and baby pressing on your bladder.  PRENATAL EXAMS  Blood work may continue to be done during prenatal exams. These tests are done to check on your health and the probable health of your baby. Blood work is used to follow your blood levels (hemoglobin). Anemia (low hemoglobin) is common during pregnancy. Iron and vitamins are given to help prevent this. You may also continue to be checked for diabetes. Some of the past blood tests may be done again.   The size of the uterus is measured during each visit. This makes sure your baby is growing properly according to your pregnancy dates.   Your blood pressure is checked every prenatal visit. This is to make sure you are not getting toxemia.   Your urine is checked every prenatal visit for infection, diabetes and protein.   Your weight is checked at each visit. This is done to make sure gains are happening at the suggested rate and that you and your baby are growing normally.   Sometimes, an ultrasound is performed to confirm the position and the proper growth and development of the baby. This is a test done that bounces harmless sound waves off the baby so your caregiver can more accurately determine due dates.   Discuss the type of pain  medication and anesthesia you will have during your labor and delivery.   Discuss the possibility and anesthesia if a Cesarean Section might be necessary.   Inform your caregiver if there is any mental or physical violence at home.  Sometimes, a specialized non-stress test, contraction stress test and biophysical profile are done to make sure the baby is not having a problem. Checking the amniotic fluid surrounding the baby is called an amniocentesis. The amniotic fluid is removed by sticking a needle into the belly (abdomen). This is sometimes done near the end of pregnancy if an early delivery is required. In this case, it is done to help make sure the baby's lungs are mature enough for the baby to live outside of the womb. If the lungs are not mature and it is unsafe to deliver the baby, an injection of cortisone medication is given to the mother 1 to 2 days before the delivery. This helps the baby's lungs mature and makes it safer to deliver the baby. CHANGES OCCURING IN THE THIRD TRIMESTER OF PREGNANCY Your body goes through many changes during pregnancy. They vary from person to person. Talk to your caregiver about changes you notice and are concerned about.  During the last trimester, you have probably had an increase in your appetite. It is normal to have cravings for certain foods. This varies from person to person and pregnancy to pregnancy.   You may begin to get stretch marks on your hips,   abdomen, and breasts. These are normal changes in the body during pregnancy. There are no exercises or medications to take which prevent this change.   Constipation may be treated with a stool softener or adding bulk to your diet. Drinking lots of fluids, fiber in vegetables, fruits, and whole grains are helpful.   Exercising is also helpful. If you have been very active up until your pregnancy, most of these activities can be continued during your pregnancy. If you have been less active, it is helpful  to start an exercise program such as walking. Consult your caregiver before starting exercise programs.   Avoid all smoking, alcohol, un-prescribed drugs, herbs and "street drugs" during your pregnancy. These chemicals affect the formation and growth of the baby. Avoid chemicals throughout the pregnancy to ensure the delivery of a healthy infant.   Backache, varicose veins and hemorrhoids may develop or get worse.   You will tire more easily in the third trimester, which is normal.   The baby's movements may be stronger and more often.   You may become short of breath easily.   Your belly button may stick out.   A yellow discharge may leak from your breasts called colostrum.   You may have a bloody mucus discharge. This usually occurs a few days to a week before labor begins.  HOME CARE INSTRUCTIONS   Keep your caregiver's appointments. Follow your caregiver's instructions regarding medication use, exercise, and diet.   During pregnancy, you are providing food for you and your baby. Continue to eat regular, well-balanced meals. Choose foods such as meat, fish, milk and other low fat dairy products, vegetables, fruits, and whole-grain breads and cereals. Your caregiver will tell you of the ideal weight gain.   A physical sexual relationship may be continued throughout pregnancy if there are no other problems such as early (premature) leaking of amniotic fluid from the membranes, vaginal bleeding, or belly (abdominal) pain.   Exercise regularly if there are no restrictions. Check with your caregiver if you are unsure of the safety of your exercises. Greater weight gain will occur in the last 2 trimesters of pregnancy. Exercising helps:   Control your weight.   Get you in shape for labor and delivery.   You lose weight after you deliver.   Rest a lot with legs elevated, or as needed for leg cramps or low back pain.   Wear a good support or jogging bra for breast tenderness during  pregnancy. This may help if worn during sleep. Pads or tissues may be used in the bra if you are leaking colostrum.   Do not use hot tubs, steam rooms, or saunas.   Wear your seat belt when driving. This protects you and your baby if you are in an accident.   Avoid raw meat, cat litter boxes and soil used by cats. These carry germs that can cause birth defects in the baby.   It is easier to loose urine during pregnancy. Tightening up and strengthening the pelvic muscles will help with this problem. You can practice stopping your urination while you are going to the bathroom. These are the same muscles you need to strengthen. It is also the muscles you would use if you were trying to stop from passing gas. You can practice tightening these muscles up 10 times a set and repeating this about 3 times per day. Once you know what muscles to tighten up, do not perform these exercises during urination. It is more likely   to cause an infection by backing up the urine.   Ask for help if you have financial, counseling or nutritional needs during pregnancy. Your caregiver will be able to offer counseling for these needs as well as refer you for other special needs.   Make a list of emergency phone numbers and have them available.   Plan on getting help from family or friends when you go home from the hospital.   Make a trial run to the hospital.   Take prenatal classes with the father to understand, practice and ask questions about the labor and delivery.   Prepare the baby's room/nursery.   Do not travel out of the city unless it is absolutely necessary and with the advice of your caregiver.   Wear only low or no heal shoes to have better balance and prevent falling.  MEDICATIONS AND DRUG USE IN PREGNANCY  Take prenatal vitamins as directed. The vitamin should contain 1 milligram of folic acid. Keep all vitamins out of reach of children. Only a couple vitamins or tablets containing iron may be fatal  to a baby or young child when ingested.   Avoid use of all medications, including herbs, over-the-counter medications, not prescribed or suggested by your caregiver. Only take over-the-counter or prescription medicines for pain, discomfort, or fever as directed by your caregiver. Do not use aspirin, ibuprofen (Motrin, Advil, Nuprin) or naproxen (Aleve) unless OK'd by your caregiver.   Let your caregiver also know about herbs you may be using.   Alcohol is related to a number of birth defects. This includes fetal alcohol syndrome. All alcohol, in any form, should be avoided completely. Smoking will cause low birth rate and premature babies.   Street/illegal drugs are very harmful to the baby. They are absolutely forbidden. A baby born to an addicted mother will be addicted at birth. The baby will go through the same withdrawal an adult does.  SEEK MEDICAL CARE IF: You have any concerns or worries during your pregnancy. It is better to call with your questions if you feel they cannot wait, rather than worry about them. DECISIONS ABOUT CIRCUMCISION You may or may not know the sex of your baby. If you know your baby is a boy, it may be time to think about circumcision. Circumcision is the removal of the foreskin of the penis. This is the skin that covers the sensitive end of the penis. There is no proven medical need for this. Often this decision is made on what is popular at the time or based upon religious beliefs and social issues. You can discuss these issues with your caregiver or pediatrician. SEEK IMMEDIATE MEDICAL CARE IF:   An unexplained oral temperature above 102 F (38.9 C) develops, or as your caregiver suggests.   You have leaking of fluid from the vagina (birth canal). If leaking membranes are suspected, take your temperature and tell your caregiver of this when you call.   There is vaginal spotting, bleeding or passing clots. Tell your caregiver of the amount and how many pads are  used.   You develop a bad smelling vaginal discharge with a change in the color from clear to white.   You develop vomiting that lasts more than 24 hours.   You develop chills or fever.   You develop shortness of breath.   You develop burning on urination.   You loose more than 2 pounds of weight or gain more than 2 pounds of weight or as suggested by your   caregiver.   You notice sudden swelling of your face, hands, and feet or legs.   You develop belly (abdominal) pain. Round ligament discomfort is a common non-cancerous (benign) cause of abdominal pain in pregnancy. Your caregiver still must evaluate you.   You develop a severe headache that does not go away.   You develop visual problems, blurred or double vision.   If you have not felt your baby move for more than 1 hour. If you think the baby is not moving as much as usual, eat something with sugar in it and lie down on your left side for an hour. The baby should move at least 4 to 5 times per hour. Call right away if your baby moves less than that.   You fall, are in a car accident or any kind of trauma.   There is mental or physical violence at home.  Document Released: 01/24/2001 Document Revised: 10/12/2010 Document Reviewed: 07/29/2008 [employer] Patient Information 2012 [employer].Fetal Movement Counts Patient Name: __________________________________________________ Patient Due Date: ____________________ Kick counts is highly recommended in high risk pregnancies, but it is a good idea for every pregnant woman to do. Start counting fetal movements at 28 weeks of the pregnancy. Fetal movements increase after eating a full meal or eating or drinking something sweet (the blood sugar is higher). It is also important to drink plenty of fluids (well hydrated) before doing the count. Lie on your left side because it helps with the circulation or you can sit in a comfortable chair with your arms over your belly (abdomen) with no  distractions around you. DOING THE COUNT  Try to do the count the same time of day each time you do it.   Mark the day and time, then see how long it takes for you to feel 10 movements (kicks, flutters, swishes, rolls). You should have at least 10 movements within 2 hours. You will most likely feel 10 movements in much less than 2 hours. If you do not, wait an hour and count again. After a couple of days you will see a pattern.   What you are looking for is a change in the pattern or not enough counts in 2 hours. Is it taking longer in time to reach 10 movements?  SEEK MEDICAL CARE IF:  You feel less than 10 counts in 2 hours. Tried twice.   No movement in one hour.   The pattern is changing or taking longer each day to reach 10 counts in 2 hours.   You feel the baby is not moving as it usually does.  Date: ____________ Movements: ____________ Start time: ____________ Finish time: ____________  Date: ____________ Movements: ____________ Start time: ____________ Finish time: ____________ Date: ____________ Movements: ____________ Start time: ____________ Finish time: ____________ Date: ____________ Movements: ____________ Start time: ____________ Finish time: ____________ Date: ____________ Movements: ____________ Start time: ____________ Finish time: ____________ Date: ____________ Movements: ____________ Start time: ____________ Finish time: ____________ Date: ____________ Movements: ____________ Start time: ____________ Finish time: ____________ Date: ____________ Movements: ____________ Start time: ____________ Finish time: ____________  Date: ____________ Movements: ____________ Start time: ____________ Finish time: ____________ Date: ____________ Movements: ____________ Start time: ____________ Finish time: ____________ Date: ____________ Movements: ____________ Start time: ____________ Finish time: ____________ Date: ____________ Movements: ____________ Start time: ____________  Finish time: ____________ Date: ____________ Movements: ____________ Start time: ____________ Finish time: ____________ Date: ____________ Movements: ____________ Start time: ____________ Finish time: ____________ Date: ____________ Movements: ____________ Start time: ____________ Finish time: ____________    Date: ____________ Movements: ____________ Start time: ____________ Finish time: ____________ Date: ____________ Movements: ____________ Start time: ____________ Finish time: ____________ Date: ____________ Movements: ____________ Start time: ____________ Finish time: ____________ Date: ____________ Movements: ____________ Start time: ____________ Finish time: ____________ Date: ____________ Movements: ____________ Start time: ____________ Finish time: ____________ Date: ____________ Movements: ____________ Start time: ____________ Finish time: ____________ Date: ____________ Movements: ____________ Start time: ____________ Finish time: ____________  Date: ____________ Movements: ____________ Start time: ____________ Finish time: ____________ Date: ____________ Movements: ____________ Start time: ____________ Finish time: ____________ Date: ____________ Movements: ____________ Start time: ____________ Finish time: ____________ Date: ____________ Movements: ____________ Start time: ____________ Finish time: ____________ Date: ____________ Movements: ____________ Start time: ____________ Finish time: ____________ Date: ____________ Movements: ____________ Start time: ____________ Finish time: ____________ Date: ____________ Movements: ____________ Start time: ____________ Finish time: ____________  Date: ____________ Movements: ____________ Start time: ____________ Finish time: ____________ Date: ____________ Movements: ____________ Start time: ____________ Finish time: ____________ Date: ____________ Movements: ____________ Start time: ____________ Finish time: ____________ Date: ____________  Movements: ____________ Start time: ____________ Finish time: ____________ Date: ____________ Movements: ____________ Start time: ____________ Finish time: ____________ Date: ____________ Movements: ____________ Start time: ____________ Finish time: ____________ Date: ____________ Movements: ____________ Start time: ____________ Finish time: ____________  Date: ____________ Movements: ____________ Start time: ____________ Finish time: ____________ Date: ____________ Movements: ____________ Start time: ____________ Finish time: ____________ Date: ____________ Movements: ____________ Start time: ____________ Finish time: ____________ Date: ____________ Movements: ____________ Start time: ____________ Finish time: ____________ Date: ____________ Movements: ____________ Start time: ____________ Finish time: ____________ Date: ____________ Movements: ____________ Start time: ____________ Finish time: ____________ Date: ____________ Movements: ____________ Start time: ____________ Finish time: ____________  Date: ____________ Movements: ____________ Start time: ____________ Finish time: ____________ Date: ____________ Movements: ____________ Start time: ____________ Finish time: ____________ Date: ____________ Movements: ____________ Start time: ____________ Finish time: ____________ Date: ____________ Movements: ____________ Start time: ____________ Finish time: ____________ Date: ____________ Movements: ____________ Start time: ____________ Finish time: ____________ Date: ____________ Movements: ____________ Start time: ____________ Finish time: ____________ Date: ____________ Movements: ____________ Start time: ____________ Finish time: ____________  Date: ____________ Movements: ____________ Start time: ____________ Finish time: ____________ Date: ____________ Movements: ____________ Start time: ____________ Finish time: ____________ Date: ____________ Movements: ____________ Start time:  ____________ Finish time: ____________ Date: ____________ Movements: ____________ Start time: ____________ Finish time: ____________ Date: ____________ Movements: ____________ Start time: ____________ Finish time: ____________ Date: ____________ Movements: ____________ Start time: ____________ Finish time: ____________ Document Released: 03/01/2006 Document Revised: 10/12/2010 Document Reviewed: 09/01/2008 [employer] Patient Information 2012 [employer].  

## 2011-04-03 ENCOUNTER — Ambulatory Visit (INDEPENDENT_AMBULATORY_CARE_PROVIDER_SITE_OTHER): Payer: Medicaid Other | Admitting: Family Medicine

## 2011-04-03 DIAGNOSIS — I2510 Atherosclerotic heart disease of native coronary artery without angina pectoris: Secondary | ICD-10-CM

## 2011-04-03 DIAGNOSIS — I359 Nonrheumatic aortic valve disorder, unspecified: Secondary | ICD-10-CM

## 2011-04-03 DIAGNOSIS — O0990 Supervision of high risk pregnancy, unspecified, unspecified trimester: Secondary | ICD-10-CM

## 2011-04-03 DIAGNOSIS — I350 Nonrheumatic aortic (valve) stenosis: Secondary | ICD-10-CM

## 2011-04-03 LAB — POCT URINALYSIS DIP (DEVICE)
Bilirubin Urine: NEGATIVE
Hgb urine dipstick: NEGATIVE
Ketones, ur: NEGATIVE mg/dL
pH: 7 (ref 5.0–8.0)

## 2011-04-03 NOTE — Progress Notes (Signed)
Patient having some SOB when walking.  No edema, cough, SOB at rest.  Discussed with MFM to make arrangements with [facility].  They will call us to notify of date of induction. No other concerns.

## 2011-04-03 NOTE — Progress Notes (Signed)
Language line: [phone] C/o edema trace in feet.

## 2011-04-04 LAB — GC/CHLAMYDIA PROBE AMP, GENITAL: GC Probe Amp, Genital: NEGATIVE

## 2011-04-10 ENCOUNTER — Ambulatory Visit (INDEPENDENT_AMBULATORY_CARE_PROVIDER_SITE_OTHER): Payer: Medicaid Other | Admitting: Family

## 2011-04-10 DIAGNOSIS — O0990 Supervision of high risk pregnancy, unspecified, unspecified trimester: Secondary | ICD-10-CM

## 2011-04-10 DIAGNOSIS — I2510 Atherosclerotic heart disease of native coronary artery without angina pectoris: Secondary | ICD-10-CM

## 2011-04-10 LAB — POCT URINALYSIS DIP (DEVICE)
Glucose, UA: NEGATIVE mg/dL
Ketones, ur: NEGATIVE mg/dL
Specific Gravity, Urine: 1.01 (ref 1.005–1.030)

## 2011-04-10 NOTE — Progress Notes (Signed)
Used [employer]: [id number] Some swelling in legs

## 2011-04-10 NOTE — Progress Notes (Signed)
Interpreter here with pt; reviewed GBS, GC/CT results; reviewed plan of care with Dr. Jolayne Panther, based on meeting with [facility], pt is to have scheduled induction of labor at [facility] at 39 wks which is to be scheduled by MFM (per [facility]).  Awaiting call back from Dr. Sherrie George.  Pt voices no problems or concerns at this visit.  Swelling trace in legs.

## 2011-04-17 ENCOUNTER — Other Ambulatory Visit: Payer: Self-pay

## 2011-04-17 ENCOUNTER — Encounter: Payer: Medicaid Other | Admitting: Obstetrics & Gynecology

## 2011-04-17 ENCOUNTER — Inpatient Hospital Stay (HOSPITAL_COMMUNITY): Payer: Medicaid Other | Admitting: Anesthesiology

## 2011-04-17 ENCOUNTER — Encounter (HOSPITAL_COMMUNITY): Payer: Self-pay | Admitting: Anesthesiology

## 2011-04-17 ENCOUNTER — Encounter (HOSPITAL_COMMUNITY): Payer: Self-pay

## 2011-04-17 ENCOUNTER — Observation Stay (HOSPITAL_COMMUNITY)
Admission: AD | Admit: 2011-04-17 | Discharge: 2011-04-17 | Disposition: A | Discharge status: Other Acute Inpatient Hospital | Payer: Medicaid Other | Source: Ambulatory Visit | Attending: Obstetrics and Gynecology | Admitting: Obstetrics and Gynecology

## 2011-04-17 DIAGNOSIS — O479 False labor, unspecified: Principal | ICD-10-CM | POA: Insufficient documentation

## 2011-04-17 DIAGNOSIS — I359 Nonrheumatic aortic valve disorder, unspecified: Secondary | ICD-10-CM

## 2011-04-17 DIAGNOSIS — O9989 Other specified diseases and conditions complicating pregnancy, childbirth and the puerperium: Secondary | ICD-10-CM

## 2011-04-17 DIAGNOSIS — O411090 Infection of amniotic sac and membranes, unspecified, unspecified trimester, not applicable or unspecified: Secondary | ICD-10-CM | POA: Insufficient documentation

## 2011-04-17 DIAGNOSIS — Z603 Acculturation difficulty: Secondary | ICD-10-CM

## 2011-04-17 DIAGNOSIS — O0990 Supervision of high risk pregnancy, unspecified, unspecified trimester: Secondary | ICD-10-CM

## 2011-04-17 DIAGNOSIS — O99891 Other specified diseases and conditions complicating pregnancy: Secondary | ICD-10-CM | POA: Insufficient documentation

## 2011-04-17 DIAGNOSIS — I350 Nonrheumatic aortic (valve) stenosis: Secondary | ICD-10-CM

## 2011-04-17 LAB — CBC
HCT: 32 % — ABNORMAL LOW (ref 36.0–46.0)
MCV: 69.3 fL — ABNORMAL LOW (ref 78.0–100.0)
RBC: 4.62 MIL/uL (ref 3.87–5.11)
WBC: 9 10*3/uL (ref 4.0–10.5)

## 2011-04-17 LAB — STREP B DNA PROBE: GBS: NEGATIVE

## 2011-04-17 LAB — MRSA PCR SCREENING: MRSA by PCR: NEGATIVE

## 2011-04-17 LAB — TYPE AND SCREEN: Antibody Screen: NEGATIVE

## 2011-04-17 MED ORDER — BUPIVACAINE HCL (PF) 0.25 % IJ SOLN
INTRAMUSCULAR | Status: DC | PRN
  Administered 2011-04-17: 3 mL via EPIDURAL
  Administered 2011-04-17: 2 mL via EPIDURAL
  Administered 2011-04-17: 3 mL via EPIDURAL

## 2011-04-17 MED ORDER — DIPHENHYDRAMINE HCL 50 MG/ML IJ SOLN: 12.5000 mg | INTRAMUSCULAR | Status: DC | PRN

## 2011-04-17 MED ORDER — EPHEDRINE 5 MG/ML INJ
10.0000 mg | INTRAVENOUS | Status: DC | PRN
  Filled 2011-04-17 (×2): qty 2

## 2011-04-17 MED ORDER — GENTAMICIN SULFATE 40 MG/ML IJ SOLN
140.0000 mg | Freq: Three times a day (TID) | INTRAMUSCULAR | Status: DC
  Filled 2011-04-17 (×2): qty 3.5

## 2011-04-17 MED ORDER — LACTATED RINGERS IV SOLN
INTRAVENOUS | Status: DC
  Administered 2011-04-17 (×3): via INTRAVENOUS

## 2011-04-17 MED ORDER — EPHEDRINE 5 MG/ML INJ
10.0000 mg | INTRAVENOUS | Status: DC | PRN
  Filled 2011-04-17: qty 2

## 2011-04-17 MED ORDER — LACTATED RINGERS IV SOLN: 500.0000 mL | INTRAVENOUS | Status: DC | PRN

## 2011-04-17 MED ORDER — OXYTOCIN 20 UNITS IN LACTATED RINGERS INFUSION - SIMPLE
1.0000 m[IU]/min | INTRAVENOUS | Status: DC
  Administered 2011-04-17: 1 m[IU]/min via INTRAVENOUS

## 2011-04-17 MED ORDER — LIDOCAINE HCL (PF) 1 % IJ SOLN
INTRAMUSCULAR | Status: DC | PRN
  Administered 2011-04-17 (×4): 3 mL

## 2011-04-17 MED ORDER — FLEET ENEMA 7-19 GM/118ML RE ENEM: 1.0000 | ENEMA | RECTAL | Status: DC | PRN

## 2011-04-17 MED ORDER — FENTANYL 2.5 MCG/ML BUPIVACAINE 1/10 % EPIDURAL INFUSION (WH - ANES)
INTRAMUSCULAR | Status: DC | PRN
  Administered 2011-04-17: 14 mL/h via EPIDURAL

## 2011-04-17 MED ORDER — ACETAMINOPHEN 325 MG PO TABS: 650.0000 mg | ORAL_TABLET | ORAL | Status: DC | PRN

## 2011-04-17 MED ORDER — OXYTOCIN BOLUS FROM INFUSION
500.0000 mL | Freq: Once | INTRAVENOUS | Status: DC
  Filled 2011-04-17: qty 500

## 2011-04-17 MED ORDER — PHENYLEPHRINE HCL 10 MG/ML IJ SOLN
30.0000 ug/min | INTRAVENOUS | Status: DC
  Filled 2011-04-17: qty 1

## 2011-04-17 MED ORDER — CITRIC ACID-SODIUM CITRATE 334-500 MG/5ML PO SOLN: 30.0000 mL | ORAL | Status: DC | PRN

## 2011-04-17 MED ORDER — IBUPROFEN 600 MG PO TABS: 600.0000 mg | ORAL_TABLET | Freq: Four times a day (QID) | ORAL | Status: DC | PRN

## 2011-04-17 MED ORDER — FENTANYL 2.5 MCG/ML BUPIVACAINE 1/10 % EPIDURAL INFUSION (WH - ANES)
14.0000 mL/h | INTRAMUSCULAR | Status: DC
  Administered 2011-04-17: 14 mL/h via EPIDURAL
  Filled 2011-04-17 (×2): qty 60

## 2011-04-17 MED ORDER — LACTATED RINGERS IV SOLN
500.0000 mL | Freq: Once | INTRAVENOUS | Status: AC
  Administered 2011-04-17: 500 mL via INTRAVENOUS

## 2011-04-17 MED ORDER — LIDOCAINE HCL (PF) 1 % IJ SOLN
30.0000 mL | INTRAMUSCULAR | Status: DC | PRN
  Filled 2011-04-17: qty 30

## 2011-04-17 MED ORDER — NOREPINEPHRINE BITARTRATE 1 MG/ML IJ SOLN
2.0000 ug/min | INTRAMUSCULAR | Status: DC
  Filled 2011-04-17: qty 4

## 2011-04-17 MED ORDER — OXYTOCIN 20 UNITS IN LACTATED RINGERS INFUSION - SIMPLE
1.0000 m[IU]/min | INTRAVENOUS | Status: DC
  Filled 2011-04-17: qty 1000

## 2011-04-17 MED ORDER — OXYCODONE-ACETAMINOPHEN 5-325 MG PO TABS: 1.0000 | ORAL_TABLET | ORAL | Status: DC | PRN

## 2011-04-17 MED ORDER — PHENYLEPHRINE 40 MCG/ML (10ML) SYRINGE FOR IV PUSH (FOR BLOOD PRESSURE SUPPORT)
80.0000 ug | PREFILLED_SYRINGE | INTRAVENOUS | Status: AC | PRN
  Administered 2011-04-17 (×3): 80 ug via INTRAVENOUS
  Filled 2011-04-17 (×2): qty 2

## 2011-04-17 MED ORDER — OXYTOCIN 20 UNITS IN LACTATED RINGERS INFUSION - SIMPLE
125.0000 mL/h | Freq: Once | INTRAVENOUS | Status: DC
  Filled 2011-04-17: qty 1000

## 2011-04-17 MED ORDER — SODIUM CHLORIDE 0.9 % IV SOLN
2.0000 g | INTRAVENOUS | Status: DC
  Administered 2011-04-17: 2 g via INTRAVENOUS
  Filled 2011-04-17 (×3): qty 2000

## 2011-04-17 MED ORDER — PHENYLEPHRINE 40 MCG/ML (10ML) SYRINGE FOR IV PUSH (FOR BLOOD PRESSURE SUPPORT)
80.0000 ug | PREFILLED_SYRINGE | INTRAVENOUS | Status: DC | PRN
  Administered 2011-04-17 (×2): 80 ug via INTRAVENOUS
  Filled 2011-04-17 (×7): qty 2

## 2011-04-17 MED ORDER — ONDANSETRON HCL 4 MG/2ML IJ SOLN: 4.0000 mg | Freq: Four times a day (QID) | INTRAMUSCULAR | Status: DC | PRN

## 2011-04-17 NOTE — Progress Notes (Signed)
   Subjective: No questions or concerns   Objective: BP 120/83  Pulse 90  Temp(Src) 98.4 F (36.9 C) (Oral)  Resp 17  Ht 5\' 3"  (1.6 m)   Total I/O In: 127 [P.O.:50; I.V.:77] Out: 150 [Urine:150]  FHT:  FHR: 130's bpm, variability: moderate,  accelerations:  Present,  decelerations:  Absent UC:   regular, every 2-2.5 minutes SVE:   Dilation: 9.5 Effacement (%): 100 Station: 0 Exam by::W. Muhammad, CNM Labs: Lab Results  Component Value Date   WBC 9.0 04/17/2011   HGB 10.5* 04/17/2011   HCT 32.0* 04/17/2011   MCV 69.3* 04/17/2011   PLT 328 04/17/2011    Assessment / Plan: Augmentation of labor, progressing well  Labor: Progressing on Pitocin Preeclampsia:  n/a Fetal Wellbeing:  Category I Pain Control:  Epidural I/D:  n/a Anticipated MOD:  NSVD  [facility] 04/17/2011, 11:34 AM

## 2011-04-17 NOTE — Progress Notes (Signed)
Dr Jolayne Panther and Ilean Skill, CNM aware was notified of pt intake/output and net amt. No orders given at this time.

## 2011-04-17 NOTE — Progress Notes (Signed)
Assessing pt by way of interpreter on phone. Continued education and explanation of procedures and POC. Pt verbalizing understanding and has no questions at this time. Assisting Dr Rodman Pickle during placement of A-line.

## 2011-04-17 NOTE — Consult Note (Signed)
CARDIOLOGY CONSULT NOTE  Patient ID: Patricia Frederick MRN: [id number] DOB/AGE: 1991/01/19 21 y.o.  Admit date: 04/17/2011 Referring Physician: Constant Primary Physician: No primary provider on file. Primary Cardiologist: Eden Emms Reason for Consultation: Aortic Stenosis and pregnancy  Active Problems:  * No active hospital problems. *    HPI:  21 yo in active labor at 38 weeks. First seen in office 9/12  referred by [facility] Department of Health for murmur.  History through husband as patient does not speak [place]. Have only been here from [place] about a year. Has a healthy 21 yo girl at home with no prior obstetric complications. ? Rheumatic heart disease. Has exertional dyspnea primarily since pregnancy. No recent echo.  No palpitations or SSCP. No meds. No prevoius surgeries outside of first pregnancy. Echo done in our office showed severe AS with mean gradient of .  Advised to terminate pregnancy during first trimester but did not.  Seen by high risk OB Dr Penne Lash.  Was supposed to deliver at [facility].  Labor at home started yesterday and admitted to [facility] today.  5-9 cm dilated.  Somewhat uncomfortable No preeclampsia.  Arterial line being started.  Echo 10/27/10  Study Conclusions  - Left ventricle: The cavity size was normal. Wall thickness was normal. Systolic function was normal. The estimated ejection fraction was in the range of 55% to 65%. Wall motion was normal; there were no regional wall motion abnormalities. Left ventricular diastolic function parameters were normal. - Aortic valve: Unicuspid. There was severe stenosis. - Atrial septum: No defect or patent foramen ovale was identified. Transthoracic echocardiography. M-mode, complete 2D, spectral Doppler, and color Doppler. Height: Height: 154.9cm. Height: 61in. Weight: Weight: 52.6kg. Weight: 115.8lb. Body mass index: BMI: 21.9kg/m^2. Body surface area: BSA: 1.64m^2. Blood pressure: 102/74. Patient status: Outpatient.  Location: [facility]     @ROS @ All other systems reviewed and negative except as noted above  Past Medical History  Diagnosis Date  . Aortic stenosis, severe     echo 9/12: EF 55-65%, severe AS, mean gradient 49 mmHg, unicuspid Aortic Valve  . Anemia   . Tachycardia   . Rheumatic heart disease 2011  . 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 Asthma Other   . Anesthesia problems Neg Hx     History   Social History  . Marital Status: Married    Spouse Name: N/A    Number of Children: N/A  . Years of Education: N/A   Occupational History  . Not on file.   Social History Main Topics  . Smoking status: Never Smoker   . Smokeless tobacco: Never Used  . Alcohol Use: No  . Drug Use: No  . Sexually Active: Yes   Other Topics Concern  . Not on file   Social History Narrative  . No narrative on file    Past Surgical History  Procedure Date  . No past surgeries         . lactated ringers  500 mL Intravenous Once  . oxytocin 20 units in LR 1000 mL  500 mL Intravenous Once  . oxytocin 20 units in LR 1000 mL  125 mL/hr Intravenous Once      . fentaNYL 2.5 mcg/ml/ bupivacaine 1/10%    . lactated ringers 20 mL/hr at 04/17/11 0920  . norepinephrine (LEVOPHED) Adult infusion    . oxytocin 20 units in LR 1000 mL    .  oxytocin 20 units in LR 1000 mL 1 milli-units/min (04/17/11 1020)  . phenylephrine (NEO-SYNEPHRINE) Adult infusion      Physical Exam: Blood pressure 121/91, pulse 88, temperature 98.4 F (36.9 C), temperature source Oral, resp. rate 21, height 5\' 3"  (1.6 m).  Pregnant [name].   Lungs clear Short SEM Abdomen with contractions consistant with gestational age Trace edema Plus two PT Trace edema Neuro nonfocal HEENT unremarkable  Labs:   Lab Results  Component Value Date   WBC 9.0 04/17/2011   HGB 10.5* 04/17/2011   HCT 32.0* 04/17/2011   MCV 69.3* 04/17/2011   PLT 328 04/17/2011      Radiology: US Ob Follow Up  03/27/2011  OBSTETRICAL ULTRASOUND: This exam was performed within a [facility]. The OB US report was generated in the AS system, and faxed to the ordering physician.   This report is also available in [employer] and in the [employer]. See AS Obstetric US report.    EKG: 3/4 NSR rate 87 LVH no change from 9/12   ASSESSMENT AND PLAN:  AS:  Not an ideal situation.  Discussed with Dr Jolayne Panther.  Minimize labor.  If she does not continue with cervical dilatation consider endothelial cervical gel as opposed to pitocin.  Avoid prolonged afterload increase.  No aortic root Issues.  Biggest issue is risk of CHF.  Consider lasix 20mg  iv post delivery for 72 hrs.  Currently stable rhythm with no CHF.  Will need to be considered for Valve replacement post op but her care has been hampered in past by language Barrier and poor compliance.  Certainly needs plan for birth control in future.  Would address this before D/C.  Will follow but not much to add at this point as mechanical obstruction to CO not possible to fix medically at this point.  SignedCharlton Haws 04/17/2011, 11:02 AM

## 2011-04-17 NOTE — Anesthesia Preprocedure Evaluation (Addendum)
Anesthesia Evaluation  Patient identified by MRN, date of birth, ID band Patient awake    Reviewed: Allergy & Precautions, H&P , NPO status , Patient's Chart, lab work & pertinent test results, reviewed documented beta blocker date and time   History of Anesthesia Complications Negative for: history of anesthetic complications  Airway Mallampati: I TM Distance: >3 FB Neck ROM: full    Dental  (+) Teeth Intact   Pulmonary neg pulmonary ROS,  breath sounds clear to auscultation        Cardiovascular + angina (patient having chest pain now) + Valvular Problems/Murmurs (h/o rheumatic heart disease, severe aortic stenosis, plan had been for delivery at [facility] in CCU, but patient arrived to MAU in labor at 5 cm) AS Rhythm:regular Rate:Normal + Systolic murmurs    Neuro/Psych negative neurological ROS  negative psych ROS   GI/Hepatic negative GI ROS, Neg liver ROS,   Endo/Other  negative endocrine ROS  Renal/GU negative Renal ROS  negative genitourinary   Musculoskeletal   Abdominal   Peds  Hematology negative hematology ROS (+)   Anesthesia Other Findings Patient is Nepali - speaks no [place].  Patient informed of planned procedures via interpreter.  Reproductive/Obstetrics (+) Pregnancy                          Anesthesia Physical Anesthesia Plan  ASA: III and Emergent  Anesthesia Plan: Epidural   Post-op Pain Management:    Induction:   Airway Management Planned:   Additional Equipment: Arterial line  Intra-op Plan:   Post-operative Plan:   Informed Consent: I have reviewed the patients History and Physical, chart, labs and discussed the procedure including the risks, benefits and alternatives for the proposed anesthesia with the patient or authorized representative who has indicated his/her understanding and acceptance.     Plan Discussed with: Surgeon  Anesthesia Plan Comments:  (Plan for arterial line prior to epidural placement.  Emergency C-s tray at bedside.  Patient admitted to [facility] - cardiology and CCM aware.  Pressors at bedside.  Will obtain second IV.  )       Anesthesia Quick Evaluation

## 2011-04-17 NOTE — Progress Notes (Signed)
Providers at bedside practicing pushing. Pt on phone with interpreters through [place]. Educating pt on how to push, pt verbalizing understanding and feeling strong urge to push. Pushing at this time unsuccessful. Will try again at a later time.

## 2011-04-17 NOTE — Progress Notes (Signed)
Patricia Frederick is a 21 y.o. G2P1001 at [redacted]w[redacted]d by ultrasound admitted for active labor  Subjective:   Objective: BP 125/94  Pulse 94  Temp(Src) 98.4 F (36.9 C) (Oral)  Resp 18  Ht 5\' 3"  (1.6 m)      FHT:  FHR: 145 bpm, variability: moderate,  accelerations:  Present,  decelerations:  Absent UC:   regular, every 3-5 minutes SVE:   7/100/-2  Labs: Lab Results  Component Value Date   WBC 9.0 04/17/2011   HGB 10.5* 04/17/2011   HCT 32.0* 04/17/2011   MCV 69.3* 04/17/2011   PLT 328 04/17/2011    Assessment / Plan: Protracted active phase  Labor: Will augment with pitocin Preeclampsia:  n/a Fetal Wellbeing:  Category I Pain Control:  Epidural I/D:  n/a Anticipated MOD:  NSVD  Patricia Frederick 04/17/2011, 10:28 AM

## 2011-04-17 NOTE — Progress Notes (Signed)
Pt states via [employer] interpreter that she has been contracting x3-4 hours. Has been dialated, unsure of dilation. Notes clear lof. Denies bleeding.

## 2011-04-17 NOTE — Progress Notes (Signed)
  Subjective: Pt comfortable with epidural; no questions or concerns  Objective:  BP 113/80  Pulse 95  Temp(Src) 98.6 F (37 C) (Axillary)  Resp 26  Ht 5\' 3"  (1.6 m)  SpO2 100%   Total I/O In: 551.8 [P.O.:50; I.V.:501.8] Out: 275 [Urine:275]  FHT:  FHR: 150 bpm, variability: moderate,  accelerations:  Present,  decelerations:  Present early UC:   regular, every 1.5-2 minutes SVE:   Dilation: 10 Effacement (%): 100 Station: +1 Exam by:: Dr Jolayne Panther and Roney Marion, CNM (rotated from OP to asynclitic)  Labs: Lab Results  Component Value Date   WBC 9.0 04/17/2011   HGB 10.5* 04/17/2011   HCT 32.0* 04/17/2011   MCV 69.3* 04/17/2011   PLT 328 04/17/2011   Attempted to push with pt at +1 station; explained pushing with interpreter on phone; minimal movement with pushing, with pt becoming short of breath and tachycardic.  Assessment / Plan: Augmentation of Labor Aortic Stenosis  Labor: Second Stage Preeclampsia:  n/a Fetal Wellbeing:  Category I Pain Control:  Epidural I/D:  n/a Anticipated MOD:  NSVD; allow pt to labor down to at least +2-+3 before attempting to actively push with hopes of applying vacuum to facilitate delivery.  (Dr. Jolayne Panther at bedside with pushing)   [facility] 04/17/2011, 3:58 PM

## 2011-04-17 NOTE — Anesthesia Procedure Notes (Addendum)
Epidural Patient location during procedure: OB Start time: 04/17/2011 9:13 AM Reason for block: procedure for pain  Staffing Anesthesiologist: Harlo Fabela L. Performed by: anesthesiologist   Preanesthetic Checklist Completed: patient identified, site marked, surgical consent, pre-op evaluation, timeout performed, IV checked, risks and benefits discussed and monitors and equipment checked  Epidural Patient position: sitting Prep: site prepped and draped and DuraPrep Patient monitoring: continuous pulse ox and blood pressure Approach: midline Injection technique: LOR air  Needle:  Needle type: Tuohy  Needle gauge: 17 G Needle length: 9 cm Needle insertion depth: 5 cm cm Catheter type: closed end flexible Catheter size: 19 Gauge Catheter at skin depth: 10 cm Test dose: negative  Assessment Events: blood not aspirated, injection not painful, no injection resistance, negative IV test and no paresthesia  Additional Notes Discussed risk of headache, infection, bleeding, nerve injury and failed or incomplete block.  Patient voices understanding and wishes to proceed.  Discussed with Nepali interpreter.  Test dose given - negative for intrathecal or intravascular symptoms.  No change in chest pain.  Bolused in 3 ml increments every 5 minutes with no significant change in blood pressure or chest pain.  Feels legs are tingling and has relief of contraction pain.

## 2011-04-17 NOTE — Progress Notes (Signed)
Dr Rodman Pickle notified of pt trend of BPs, HR, pain level, and FHR tracing.

## 2011-04-17 NOTE — Progress Notes (Signed)
I have taken over the call at 5 pm and received a very thorough check out on Patricia Frederick. I have spoken with Dr. Rica Koyanagi after doing a cervical check and found no change. Dr. Rachel Bo recommended that the pitocin be stopped so I have discontinued it. The patient now has a fever, and I will treat her for chorioamnionitis with amp and gent. I have spoken with Dr. Jerrye Bushy who very much agrees with a transfer to [facility]. Dr. Rachel Bo has agreed to accept the patient in transfer.

## 2011-04-17 NOTE — H&P (Signed)
Patricia Frederick is a 21 y.o. female G2P1001 at [redacted]w[redacted]d presenting for active labor. Patient has been having contractions for 3-4 hours. No gush of fluid, no bleeding. Patient has prenatal care at [facility] due to severe aortic stenosis. She has a scheduled induction at [facility] on April 23, 2011.  Maternal Medical History:  Reason for admission: Reason for admission: contractions.  Contractions: Onset was 3-5 hours ago.   Frequency: regular.    Fetal activity: Perceived fetal activity is normal.      OB History    Grav Para Term Preterm Abortions TAB SAB Ect Mult Living   2 1 1       1      Past Medical History  Diagnosis Date  . Aortic stenosis, severe     echo 9/12: EF 55-65%, severe AS, mean gradient 49 mmHg, unicuspid Aortic Valve  . Anemia   . Tachycardia   . Rheumatic heart disease 2011  . 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 Family History: family history includes Asthma in her other. Social History:  reports that she has never smoked. She has never used smokeless tobacco. She reports that she does not drink alcohol or use illicit drugs.  ROS Negative except as noted in HPI  Dilation: 5 Effacement (%): 90 Station: -2 Exam by:: SBeck, RN There were no vitals taken for this visit.  Exam Physical Exam  Constitutional: She appears well-developed and well-nourished. She appears distressed (Uncomfortable with contractions).  HENT:  Head: Normocephalic and atraumatic.  Neck: Neck supple.  Cardiovascular: Normal rate.   Murmur heard. Respiratory: Effort normal and breath sounds normal.  GI: Soft.       Gravid, toco in place  Neurological: She is alert.  Skin: Skin is warm and dry.    Prenatal labs: ABO, Rh: --/--/B POS (08/21 1812) Antibody: NEG (08/21 1809) Rubella: Immune (08/21 0000) RPR: NON REAC (12/17 0939)  HBsAg: Negative (08/21 0000)  HIV: Non-reactive (08/21 0000)  GBS:   Negative (04/03/11)  Echo: (10/27/10) Study  Conclusions - Left ventricle: The cavity size was normal. Wall thickness was normal. Systolic function was normal. The estimated ejection fraction was in the range of 55% to 65%. Wall motion was normal; there were no regional wall motion abnormalities. Left ventricular diastolic function parameters were normal. - Aortic valve: Unicuspid. There was severe stenosis. - Atrial septum: No defect or patent foramen ovale was identified. Transthoracic echocardiography. M-mode, complete 2D, spectral Doppler, and color Doppler. Height: Height: 154.9cm. Height: 61in. Weight: Weight: 52.6kg. Weight: 115.8lb. Body mass index: BMI: 21.9kg/m^2. Body surface area: BSA: 1.56m^2. Blood pressure: 102/74. Patient status: Outpatient. Location: [facility]  Assessment/Plan: 21 yo G2P1001 at [redacted]w[redacted]d presenting for active labor - Admit to ICU, attending Dr. Penne Lash - Patient too advanced into labor to transport - Will consult MFM given high risk patient - Patient desires IV pain medication; unsure of epidural at this time - Expectant management of labor  Davionna Blacksher 04/17/2011, 7:42 AM

## 2011-04-17 NOTE — Progress Notes (Signed)
Dr Rodman Pickle notified of pt return of BPs to her baseline, and the amt of phenylephrine given. No additional orders given.

## 2011-04-17 NOTE — Progress Notes (Signed)
1 set on FHR tracing in medical record. Time on tracing is incorrect is an hour ahead of real time. OBIX resumes time at 1014 on 04/17/11.

## 2011-04-17 NOTE — Anesthesia Postprocedure Evaluation (Signed)
Patient being transferred to [facility].  Epidural bolus given with termination of infusion.  Jasmine December, MD

## 2011-04-17 NOTE — Progress Notes (Signed)
Pt D/c to [facility], report given. Pt verbalizes understanding and has no further questions. Pt at this time hemodynamically stable.

## 2011-04-17 NOTE — Progress Notes (Signed)
Cordelia Pen, RN and this RN at bedside readjusting pt and determining reliability of BPs. No orders given at this time.

## 2011-04-17 NOTE — Progress Notes (Signed)
Dr Rodman Pickle notified of pt BPs steadily dropping for pt baseline, informed of pt not having complaints, FHR tracing, HR, and trouble-shooting with BPs. Order to give phenylephrine now until BPs stabilize around 130's/ 80's.

## 2011-04-17 NOTE — Progress Notes (Signed)
Patricia Frederick is a 21 y.o. G2P1001 at [redacted]w[redacted]d by ultrasound admitted for active labor  Subjective: Patient comfortable with epidural. Denies chest pain or SOB  Objective: BP 126/89  Pulse 94  Temp(Src) 98.8 F (37.1 C) (Axillary)  Resp 15  Ht 5\' 3"  (1.6 m)  SpO2 100%   Total I/O In: 259.2 [P.O.:50; I.V.:209.2] Out: 225 [Urine:225]  FHT:  FHR: 145 bpm, variability: moderate,  accelerations:  Present,  decelerations:  Absent UC:   irregular, every 2-5 minutes SVE:   10/100/+1  Labs: Lab Results  Component Value Date   WBC 9.0 04/17/2011   HGB 10.5* 04/17/2011   HCT 32.0* 04/17/2011   MCV 69.3* 04/17/2011   PLT 328 04/17/2011    Assessment / Plan: Augmentation of labor, progressing well Continue passive second stage of labor due to moderate-severe aortic stenosis.  Labor: Progressing normally Preeclampsia:  n/a Fetal Wellbeing:  Category I Pain Control:  Epidural I/D:  n/a Anticipated MOD:  NSVD  Sherrie Marsan 04/17/2011, 1:27 PM

## 2011-04-18 NOTE — H&P (Signed)
Pt seen and examined. Pt has mild chest pain currently.  No shortness of breath.   Cervix now 6-7 cm, 100 %, -3. FHT reassuring. Chest-CTA B CV-RRR with murmur Abdomen-soft, palpable contractions Ext - NT, neg edema, neg redness  Pt is active labor at 38 weeks 1 day.  Severe aortic stenosis.  Pt was to deliver at [facility] but came here instead.    -Anesthesia notified and came to bedside immediately -MFM called and consulted.  Dr. Rachel Bo is obtaining cardiology records from [facility]), called and consulted.  Asked to come see patient at [facility] / Criticical care, Dr. Vassie Loll aware of patient, will monitor via e link, will come to see patientt if line needed that anesthesia can't provide or if pt's status worsens. All phone calls were made by 8:00 a.m. Dr. Jolayne Panther assumed care of patient at 8:15 a.m.  A line to be placed. Epidural to be placed while maintaining blood pressure. Passive 2nd stage.

## 2011-04-18 NOTE — Progress Notes (Signed)
Post discharge chart review completed.  

## 2011-04-28 ENCOUNTER — Ambulatory Visit: Payer: Medicaid Other | Admitting: Obstetrics and Gynecology

## 2011-05-17 ENCOUNTER — Ambulatory Visit: Payer: Medicaid Other | Admitting: Obstetrics and Gynecology

## 2011-06-09 ENCOUNTER — Ambulatory Visit (INDEPENDENT_AMBULATORY_CARE_PROVIDER_SITE_OTHER): Payer: Medicaid Other | Admitting: Obstetrics & Gynecology

## 2011-06-09 ENCOUNTER — Encounter: Payer: Self-pay | Admitting: Obstetrics & Gynecology

## 2011-06-09 VITALS — BP 133/93 | HR 83 | Temp 97.2°F | Ht 61.0 in | Wt 118.2 lb

## 2011-06-09 DIAGNOSIS — Z01812 Encounter for preprocedural laboratory examination: Secondary | ICD-10-CM

## 2011-06-09 NOTE — Progress Notes (Signed)
  Subjective:    Patient ID: Patricia Frederick, female    DOB: Feb 23, 1990, 21 y.o.   MRN: [id number]  [id number] No LMP recorded. Patient is not currently having periods (Reason: Other). Patient delivered 8 weeks ago at [facility], transferred there due to aortic stenosis. She was hospitalized for 7 days, seen by cardiology, has finished her beta blocker. Is to schedule cards f/u. Baby doing well.   Past Medical History  Diagnosis Date  . Aortic stenosis, severe     echo 9/12: EF 55-65%, severe AS, mean gradient 49 mmHg, unicuspid Aortic Valve  . Anemia   . Tachycardia   . Rheumatic heart disease 2011  . 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 No past surgeries   C/S and BTL No Known Allergies Current outpatient prescriptions:Prenatal Vit-Fe Fumarate-FA (PRENATAL MULTIVITAMIN) TABS, Take 1 tablet by mouth daily., Disp: , Rfl:  History   Social History  . Marital Status: Married    Spouse Name: N/A    Number of Children: N/A  . Years of Education: N/A   Occupational History  . Not on file.   Social History Main Topics  . Smoking status: Never Smoker   . Smokeless tobacco: Never Used  . Alcohol Use: No  . Drug Use: No  . Sexually Active: Yes   Other Topics Concern  . Not on file   Social History Narrative  . No narrative on file   Needs Nepali interpreter  Family History  Problem Relation Age of Onset  . Asthma Other   . Anesthesia problems Neg Hx      Review of Systems No SOB, CP, bleeding, D/C, urinary sx    Objective:   Physical Exam  Filed Vitals:   06/09/11 0906  BP: 133/93  Pulse: 83  Temp: 97.2 F (36.2 C)   NAD, pleasant Abdomen-ND, NT, low incision well healing, no mass Pelvic deferred Extremity nl      Assessment & Plan:  Doing well postop Cardiology f/u S/P BTL  Halima Fogal 06/09/2011 9:57 AM

## 2011-06-09 NOTE — Patient Instructions (Signed)

## 2012-06-14 ENCOUNTER — Encounter (HOSPITAL_COMMUNITY): Payer: Self-pay | Admitting: Cardiology

## 2012-06-14 ENCOUNTER — Emergency Department (HOSPITAL_COMMUNITY)
Admission: EM | Admit: 2012-06-14 | Discharge: 2012-06-14 | Disposition: A | Discharge status: Home / Self Care | Payer: Medicaid Other | Attending: Emergency Medicine | Admitting: Emergency Medicine

## 2012-06-14 DIAGNOSIS — R011 Cardiac murmur, unspecified: Secondary | ICD-10-CM | POA: Insufficient documentation

## 2012-06-14 DIAGNOSIS — IMO0001 Reserved for inherently not codable concepts without codable children: Secondary | ICD-10-CM | POA: Insufficient documentation

## 2012-06-14 DIAGNOSIS — R110 Nausea: Secondary | ICD-10-CM | POA: Insufficient documentation

## 2012-06-14 DIAGNOSIS — J020 Streptococcal pharyngitis: Secondary | ICD-10-CM | POA: Insufficient documentation

## 2012-06-14 DIAGNOSIS — M542 Cervicalgia: Secondary | ICD-10-CM | POA: Insufficient documentation

## 2012-06-14 DIAGNOSIS — Z862 Personal history of diseases of the blood and blood-forming organs and certain disorders involving the immune mechanism: Secondary | ICD-10-CM | POA: Insufficient documentation

## 2012-06-14 DIAGNOSIS — Z8679 Personal history of other diseases of the circulatory system: Secondary | ICD-10-CM | POA: Insufficient documentation

## 2012-06-14 DIAGNOSIS — R1310 Dysphagia, unspecified: Secondary | ICD-10-CM | POA: Insufficient documentation

## 2012-06-14 DIAGNOSIS — R509 Fever, unspecified: Secondary | ICD-10-CM | POA: Insufficient documentation

## 2012-06-14 LAB — URINALYSIS, ROUTINE W REFLEX MICROSCOPIC
Glucose, UA: NEGATIVE mg/dL
Leukocytes, UA: NEGATIVE
Nitrite: NEGATIVE
pH: 5.5 (ref 5.0–8.0)

## 2012-06-14 LAB — CBC WITH DIFFERENTIAL/PLATELET
Basophils Absolute: 0 10*3/uL (ref 0.0–0.1)
Eosinophils Absolute: 0 10*3/uL (ref 0.0–0.7)
Eosinophils Relative: 0 % (ref 0–5)
MCH: 24.6 pg — ABNORMAL LOW (ref 26.0–34.0)
Monocytes Absolute: 1.1 10*3/uL — ABNORMAL HIGH (ref 0.1–1.0)
Neutrophils Relative %: 87 % — ABNORMAL HIGH (ref 43–77)
Platelets: 329 10*3/uL (ref 150–400)
RBC: 5.41 MIL/uL — ABNORMAL HIGH (ref 3.87–5.11)
RDW: 13.3 % (ref 11.5–15.5)

## 2012-06-14 LAB — COMPREHENSIVE METABOLIC PANEL
Albumin: 4.5 g/dL (ref 3.5–5.2)
BUN: 8 mg/dL (ref 6–23)
Calcium: 10 mg/dL (ref 8.4–10.5)
Creatinine, Ser: 0.65 mg/dL (ref 0.50–1.10)
GFR calc Af Amer: >90 mL/min (ref >90)
Glucose, Bld: 103 mg/dL — ABNORMAL HIGH (ref 70–99)
Total Protein: 8.5 g/dL — ABNORMAL HIGH (ref 6.0–8.3)

## 2012-06-14 MED ORDER — PENICILLIN V POTASSIUM 500 MG PO TABS: 500.0000 mg | ORAL_TABLET | Freq: Four times a day (QID) | ORAL | Status: AC

## 2012-06-14 MED ORDER — IBUPROFEN 800 MG PO TABS
800.0000 mg | ORAL_TABLET | Freq: Once | ORAL | Status: AC
  Administered 2012-06-14: 800 mg via ORAL
  Filled 2012-06-14: qty 1

## 2012-06-14 MED ORDER — SODIUM CHLORIDE 0.9 % IV BOLUS (SEPSIS)
1000.0000 mL | Freq: Once | INTRAVENOUS | Status: AC
  Administered 2012-06-14: 1000 mL via INTRAVENOUS

## 2012-06-14 MED ORDER — AMOXICILLIN 500 MG PO CAPS
500.0000 mg | ORAL_CAPSULE | Freq: Once | ORAL | Status: AC
  Administered 2012-06-14: 500 mg via ORAL
  Filled 2012-06-14: qty 1

## 2012-06-14 MED ORDER — ACETAMINOPHEN 325 MG PO TABS
650.0000 mg | ORAL_TABLET | Freq: Four times a day (QID) | ORAL | Status: DC | PRN
  Administered 2012-06-14: 650 mg via ORAL
  Filled 2012-06-14: qty 2

## 2012-06-14 NOTE — ED Notes (Signed)
Pt reports a sore throat for the past couple of days and reports pain with swallowing. Also with fever and generalized body aches. States she has been unable to drink or eat much and has had some vomiting.

## 2012-06-14 NOTE — ED Notes (Signed)
Discharge instructions reviewed. Pt verbalized understanding.  

## 2012-06-14 NOTE — ED Notes (Signed)
Pt also states she has a headache.

## 2012-06-14 NOTE — ED Provider Notes (Signed)
History     CSN: [id number]  Arrival date & time 06/14/12  1748   First MD Initiated Contact with Patient 06/14/12 1824      Chief Complaint  Patient presents with  . Sore Throat  . Neck Pain    (Consider location/radiation/quality/duration/timing/severity/associated sxs/prior treatment) HPI Comments: Patient presents with a two-day history of sore throat and pain on swallowing. She does complain of some fevers and body aches. She had some nausea today but denies vomiting. She states she's having trouble eating and drinking due to pain on swallowing. She denies any chest pain or shortness of breath. She denies any rashes. She denies any cough or cold symptoms. She states her symptoms had been worsening since yesterday.  Patient is a 22 y.o. female presenting with pharyngitis and neck pain.  Sore Throat Pertinent negatives include no chest pain, no abdominal pain, no headaches and no shortness of breath.  Neck Pain Associated symptoms: no chest pain, no fever, no headaches, no numbness and no weakness     Past Medical History  Diagnosis Date  . Aortic stenosis, severe     echo 9/12: EF 55-65%, severe AS, mean gradient 49 mmHg, unicuspid Aortic Valve  . Anemia   . Tachycardia   . Rheumatic heart disease 2011  . 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 No past surgeries      Family History  Problem Relation Age of Onset  . Asthma Other   . Anesthesia problems Neg Hx     History  Substance Use Topics  . Smoking status: Never Smoker   . Smokeless tobacco: Never Used  . Alcohol Use: No    OB History   Grav Para Term Preterm Abortions TAB SAB Ect Mult Living   2 1 1       1       Review of Systems  Constitutional: Negative for fever, chills, diaphoresis and fatigue.  HENT: Positive for sore throat and trouble swallowing. Negative for congestion, rhinorrhea, sneezing and neck pain.   Eyes: Negative.    Respiratory: Negative for cough, chest tightness and shortness of breath.   Cardiovascular: Negative for chest pain and leg swelling.  Gastrointestinal: Positive for nausea. Negative for vomiting, abdominal pain, diarrhea and blood in stool.  Genitourinary: Negative for frequency, hematuria, flank pain and difficulty urinating.  Musculoskeletal: Positive for myalgias. Negative for back pain and arthralgias.  Skin: Negative for rash.  Neurological: Negative for dizziness, speech difficulty, weakness, numbness and headaches.    Allergies  Review of patient's allergies indicates no known allergies.  Home Medications   Current Outpatient Rx  Name  Route  Sig  Dispense  Refill  . penicillin v potassium (VEETID) 500 MG tablet   Oral   Take 1 tablet (500 mg total) by mouth 4 (four) times daily.   40 tablet   0     BP 98/64  Pulse 133  Temp(Src) 99.3 F (37.4 C) (Oral)  Resp 18  SpO2 97%  Physical Exam  Constitutional: She is oriented to person, place, and time. She appears well-developed and well-nourished.  HENT:  Head: Normocephalic and atraumatic.  Marked erythema neck states of the posterior pharynx. Uvula is midline with no fullness to the peritonsillar spaces. No trismus. Positive cervical lymphadenopathy bilaterally.  Eyes: Pupils are equal, round, and reactive to light.  Neck: Normal range of motion. Neck supple.  Cardiovascular: Normal  rate and regular rhythm.   Murmur heard. Patient with a known history of aortic stenosis  Pulmonary/Chest: Effort normal and breath sounds normal. No stridor. No respiratory distress. She has no wheezes. She has no rales. She exhibits no tenderness.  Abdominal: Soft. Bowel sounds are normal. There is no tenderness. There is no rebound and no guarding.  Musculoskeletal: Normal range of motion. She exhibits no edema.  Lymphadenopathy:    She has cervical adenopathy.  Neurological: She is alert and oriented to person, place, and time.   Skin: Skin is warm and dry. No rash noted.  Psychiatric: She has a normal mood and affect.    ED Course  Procedures (including critical care time)  Results for orders placed during the hospital encounter of 06/14/12  RAPID STREP SCREEN      Result Value Range   Streptococcus, Group A Screen (Direct) POSITIVE (*) NEGATIVE  CBC WITH DIFFERENTIAL      Result Value Range   WBC 22.9 (*) 4.0 - 10.5 K/uL   RBC 5.41 (*) 3.87 - 5.11 MIL/uL   Hemoglobin 13.3  12.0 - 15.0 g/dL   HCT 04.5  40.9 - 81.1 %   MCV 70.6 (*) 78.0 - 100.0 fL   MCH 24.6 (*) 26.0 - 34.0 pg   MCHC 34.8  30.0 - 36.0 g/dL   RDW 91.4  78.2 - 95.6 %   Platelets 329  150 - 400 K/uL   Neutrophils Relative 87 (*) 43 - 77 %   Lymphocytes Relative 8 (*) 12 - 46 %   Monocytes Relative 5  3 - 12 %   Eosinophils Relative 0  0 - 5 %   Basophils Relative 0  0 - 1 %   Neutro Abs 20.0 (*) 1.7 - 7.7 K/uL   Lymphs Abs 1.8  0.7 - 4.0 K/uL   Monocytes Absolute 1.1 (*) 0.1 - 1.0 K/uL   Eosinophils Absolute 0.0  0.0 - 0.7 K/uL   Basophils Absolute 0.0  0.0 - 0.1 K/uL   RBC Morphology STOMATOCYTES     WBC Morphology ATYPICAL LYMPHOCYTES     Smear Review LARGE PLATELETS PRESENT    COMPREHENSIVE METABOLIC PANEL      Result Value Range   Sodium 135  135 - 145 mEq/L   Potassium 3.4 (*) 3.5 - 5.1 mEq/L   Chloride 98  96 - 112 mEq/L   CO2 22  19 - 32 mEq/L   Glucose, Bld 103 (*) 70 - 99 mg/dL   BUN 8  6 - 23 mg/dL   Creatinine, Ser 2.13  0.50 - 1.10 mg/dL   Calcium 08.6  8.4 - 57.8 mg/dL   Total Protein 8.5 (*) 6.0 - 8.3 g/dL   Albumin 4.5  3.5 - 5.2 g/dL   AST 21  0 - 37 U/L   ALT 14  0 - 35 U/L   Alkaline Phosphatase 124 (*) 39 - 117 U/L   Total Bilirubin 0.5  0.3 - 1.2 mg/dL   GFR calc non Af Amer >90  >90 mL/min   GFR calc Af Amer >90  >90 mL/min  URINALYSIS, ROUTINE W REFLEX MICROSCOPIC      Result Value Range   Color, Urine YELLOW  YELLOW   APPearance HAZY (*) CLEAR   Specific Gravity, Urine 1.013  1.005 - 1.030   pH  5.5  5.0 - 8.0   Glucose, UA NEGATIVE  NEGATIVE mg/dL   Hgb urine dipstick NEGATIVE  NEGATIVE   Bilirubin  Urine NEGATIVE  NEGATIVE   Ketones, ur NEGATIVE  NEGATIVE mg/dL   Protein, ur NEGATIVE  NEGATIVE mg/dL   Urobilinogen, UA 0.2  0.0 - 1.0 mg/dL   Nitrite NEGATIVE  NEGATIVE   Leukocytes, UA NEGATIVE  NEGATIVE   No results found.     1. Streptococcal sore throat       MDM  Patient had strep throat. There is no suggestion of peritonsillar abscess. She has no difficulty swallowing and no physical findings of a peritonsillar abscess. She was febrile with tachycardia on arrival. This did respond to antipyretics and IV fluids. She was started on penicillin and advised to followup with her primary care physician or return here as needed if her symptoms worsen.        Rolan Bucco, MD 06/14/12 2228

## 2013-12-15 ENCOUNTER — Encounter (HOSPITAL_COMMUNITY): Payer: Self-pay | Admitting: Cardiology

## 2015-02-12 ENCOUNTER — Encounter (HOSPITAL_COMMUNITY): Payer: Self-pay | Admitting: Emergency Medicine

## 2015-02-12 ENCOUNTER — Emergency Department (HOSPITAL_COMMUNITY)
Admission: EM | Admit: 2015-02-12 | Discharge: 2015-02-12 | Disposition: A | Discharge status: Home / Self Care | Payer: Medicaid Other | Attending: Emergency Medicine | Admitting: Emergency Medicine

## 2015-02-12 ENCOUNTER — Emergency Department (HOSPITAL_COMMUNITY): Payer: Medicaid Other

## 2015-02-12 DIAGNOSIS — Z862 Personal history of diseases of the blood and blood-forming organs and certain disorders involving the immune mechanism: Secondary | ICD-10-CM | POA: Diagnosis not present

## 2015-02-12 DIAGNOSIS — Z3202 Encounter for pregnancy test, result negative: Secondary | ICD-10-CM | POA: Insufficient documentation

## 2015-02-12 DIAGNOSIS — Z8679 Personal history of other diseases of the circulatory system: Secondary | ICD-10-CM | POA: Insufficient documentation

## 2015-02-12 DIAGNOSIS — J069 Acute upper respiratory infection, unspecified: Secondary | ICD-10-CM

## 2015-02-12 DIAGNOSIS — R079 Chest pain, unspecified: Secondary | ICD-10-CM | POA: Diagnosis present

## 2015-02-12 LAB — CBC
HEMATOCRIT: 40.2 % (ref 36.0–46.0)
HEMOGLOBIN: 13.4 g/dL (ref 12.0–15.0)
MCH: 24.6 pg — AB (ref 26.0–34.0)
MCHC: 33.3 g/dL (ref 30.0–36.0)
MCV: 73.8 fL — ABNORMAL LOW (ref 78.0–100.0)
Platelets: 296 10*3/uL (ref 150–400)
RBC: 5.45 MIL/uL — ABNORMAL HIGH (ref 3.87–5.11)
RDW: 13.2 % (ref 11.5–15.5)
WBC: 11 10*3/uL — ABNORMAL HIGH (ref 4.0–10.5)

## 2015-02-12 LAB — BASIC METABOLIC PANEL
ANION GAP: 10 (ref 5–15)
BUN: 8 mg/dL (ref 6–20)
CHLORIDE: 103 mmol/L (ref 101–111)
CO2: 24 mmol/L (ref 22–32)
Calcium: 9.3 mg/dL (ref 8.9–10.3)
Creatinine, Ser: 0.68 mg/dL (ref 0.44–1.00)
GFR calc non Af Amer: >60 mL/min (ref >60)
Glucose, Bld: 118 mg/dL — ABNORMAL HIGH (ref 65–99)
POTASSIUM: 3.8 mmol/L (ref 3.5–5.1)
Sodium: 137 mmol/L (ref 135–145)

## 2015-02-12 LAB — D-DIMER, QUANTITATIVE (NOT AT ARMC)

## 2015-02-12 LAB — I-STAT BETA HCG BLOOD, ED (MC, WL, AP ONLY): I-stat hCG, quantitative: <5 m[IU]/mL (ref <5)

## 2015-02-12 LAB — I-STAT TROPONIN, ED: Troponin i, poc: 0 ng/mL (ref 0.00–0.08)

## 2015-02-12 LAB — POC URINE PREG, ED: Preg Test, Ur: NEGATIVE

## 2015-02-12 MED ORDER — CETIRIZINE HCL 10 MG PO TABS: 10.0000 mg | ORAL_TABLET | Freq: Every day | ORAL | Status: DC

## 2015-02-12 MED ORDER — ACETAMINOPHEN 500 MG PO TABS
1000.0000 mg | ORAL_TABLET | Freq: Once | ORAL | Status: AC
  Administered 2015-02-12: 1000 mg via ORAL
  Filled 2015-02-12: qty 2

## 2015-02-12 MED ORDER — FLUTICASONE PROPIONATE 50 MCG/ACT NA SUSP: 2.0000 | Freq: Every day | NASAL | Status: DC

## 2015-02-12 MED ORDER — BENZONATATE 100 MG PO CAPS
200.0000 mg | ORAL_CAPSULE | Freq: Once | ORAL | Status: AC
  Administered 2015-02-12: 200 mg via ORAL
  Filled 2015-02-12: qty 2

## 2015-02-12 MED ORDER — BENZONATATE 100 MG PO CAPS: 100.0000 mg | ORAL_CAPSULE | Freq: Three times a day (TID) | ORAL | Status: DC

## 2015-02-12 NOTE — Discharge Instructions (Signed)
Upper Respiratory Infection, Adult Most upper respiratory infections (URIs) are a viral infection of the air passages leading to the lungs. A URI affects the nose, throat, and upper air passages. The most common type of URI is nasopharyngitis and is typically referred to as "the common cold." URIs run their course and usually go away on their own. Most of the time, a URI does not require medical attention, but sometimes a bacterial infection in the upper airways can follow a viral infection. This is called a secondary infection. Sinus and middle ear infections are common types of secondary upper respiratory infections. Bacterial pneumonia can also complicate a URI. A URI can worsen asthma and chronic obstructive pulmonary disease (COPD). Sometimes, these complications can require emergency medical care and may be life threatening.  CAUSES Almost all URIs are caused by viruses. A virus is a type of germ and can spread from one person to another.  RISKS FACTORS You may be at risk for a URI if:   You smoke.   You have chronic heart or lung disease.  You have a weakened defense (immune) system.   You are very young or very old.   You have nasal allergies or asthma.  You work in crowded or poorly ventilated areas.  You work in health care facilities or schools. SIGNS AND SYMPTOMS  Symptoms typically develop 2-3 days after you come in contact with a cold virus. Most viral URIs last 7-10 days. However, viral URIs from the influenza virus (flu virus) can last 14-18 days and are typically more severe. Symptoms may include:   Runny or stuffy (congested) nose.   Sneezing.   Cough.   Sore throat.   Headache.   Fatigue.   Fever.   Loss of appetite.   Pain in your forehead, behind your eyes, and over your cheekbones (sinus pain).  Muscle aches.  DIAGNOSIS  Your health care provider may diagnose a URI by:  Physical exam.  Tests to check that your symptoms are not due to  another condition such as:  Strep throat.  Sinusitis.  Pneumonia.  Asthma. TREATMENT  A URI goes away on its own with time. It cannot be cured with medicines, but medicines may be prescribed or recommended to relieve symptoms. Medicines may help:  Reduce your fever.  Reduce your cough.  Relieve nasal congestion. HOME CARE INSTRUCTIONS   Take medicines only as directed by your health care provider.   Gargle warm saltwater or take cough drops to comfort your throat as directed by your health care provider.  Use a warm mist humidifier or inhale steam from a shower to increase air moisture. This may make it easier to breathe.  Drink enough fluid to keep your urine clear or pale yellow.   Eat soups and other clear broths and maintain good nutrition.   Rest as needed.   Return to work when your temperature has returned to normal or as your health care provider advises. You may need to stay home longer to avoid infecting others. You can also use a face mask and careful hand washing to prevent spread of the virus.  Increase the usage of your inhaler if you have asthma.   Do not use any tobacco products, including cigarettes, chewing tobacco, or electronic cigarettes. If you need help quitting, ask your health care provider. PREVENTION  The best way to protect yourself from getting a cold is to practice good hygiene.   Avoid oral or hand contact with people with cold   symptoms.   Wash your hands often if contact occurs.  There is no clear evidence that vitamin C, vitamin E, echinacea, or exercise reduces the chance of developing a cold. However, it is always recommended to get plenty of rest, exercise, and practice good nutrition.  SEEK MEDICAL CARE IF:   You are getting worse rather than better.   Your symptoms are not controlled by medicine.   You have chills.  You have worsening shortness of breath.  You have brown or red mucus.  You have yellow or brown nasal  discharge.  You have pain in your face, especially when you bend forward.  You have a fever.  You have swollen neck glands.  You have pain while swallowing.  You have white areas in the back of your throat. SEEK IMMEDIATE MEDICAL CARE IF:   You have severe or persistent:  Headache.  Ear pain.  Sinus pain.  Chest pain.  You have chronic lung disease and any of the following:  Wheezing.  Prolonged cough.  Coughing up blood.  A change in your usual mucus.  You have a stiff neck.  You have changes in your:  Vision.  Hearing.  Thinking.  Mood. MAKE SURE YOU:   Understand these instructions.  Will watch your condition.  Will get help right away if you are not doing well or get worse.   This information is not intended to replace advice given to you by your health care provider. Make sure you discuss any questions you have with your health care provider.   Document Released: 07/26/2000 Document Revised: 06/16/2014 Document Reviewed: 05/07/2013 Elsevier Interactive Patient Education 2016 [employer]  

## 2015-02-12 NOTE — ED Provider Notes (Addendum)
CSN: [id number]     Arrival date & time 02/12/15  0231 History  By signing my name below, I, Budd Palmer, attest that this documentation has been prepared under the direction and in the presence of Larose Batres, MD. Electronically Signed: Budd Palmer, ED Scribe. 02/12/2015. 3:06 AM.    Chief Complaint  Patient presents with  . Chest Pain   Patient is a 24 y.o. female presenting with chest pain and URI. The history is provided by a relative and the patient. No language interpreter was used.  Chest Pain Pain location:  Substernal area Pain quality: aching   Pain radiates to:  Does not radiate Pain radiates to the back: no   Pain severity:  Mild Onset quality:  Gradual Timing:  Constant Progression:  Unchanged Chronicity:  New Context comment:  Coughing and nasal congestion Relieved by:  Nothing Worsened by:  Nothing tried Ineffective treatments:  None tried Associated symptoms: cough   Associated symptoms: no abdominal pain, no back pain, no fever, no headache and no shortness of breath   Risk factors: no aortic disease   URI Presenting symptoms: congestion, cough and rhinorrhea   Presenting symptoms: no fever and no sore throat   Severity:  Mild Onset quality:  Gradual Timing:  Constant Progression:  Unchanged Chronicity:  New Relieved by:  Nothing Worsened by:  Nothing tried Ineffective treatments:  None tried Associated symptoms: no arthralgias, no headaches, no myalgias, no neck pain, no swollen glands and no wheezing   Risk factors: not elderly    HPI Comments: Patricia Frederick is a 24 y.o. female with a PMHx of aortic stenosis, tachycardia, rheumatic heart disease, and hemoglobin E trait who presents to the Emergency Department complaining of post-tussive chest pain onset 1 day ago. Relative reports pt having associated subjective fever, rhinorrhea, and cough. Per relative, pt is not on any medications. He notes pt's LNMP was this month. Per relative, pt denies  congestion and sore throat.    Past Medical History  Diagnosis Date  . Aortic stenosis, severe     echo 9/12: EF 55-65%, severe AS, mean gradient 49 mmHg, unicuspid Aortic Valve  . Anemia   . Tachycardia   . Rheumatic heart disease 2011  . 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 No past surgeries     Family History  Problem Relation Age of Onset  . Asthma Other   . Anesthesia problems Neg Hx    Social History  Substance Use Topics  . Smoking status: Never Smoker   . Smokeless tobacco: Never Used  . Alcohol Use: No   OB History    Gravida Para Term Preterm AB TAB SAB Ectopic Multiple Living   Review of Systems  Constitutional: Negative for fever and chills.  HENT: Positive for congestion and rhinorrhea. Negative for sore throat.   Respiratory: Positive for cough. Negative for shortness of breath and wheezing.   Cardiovascular: Positive for chest pain.  Gastrointestinal: Negative for abdominal pain.  Musculoskeletal: Negative for myalgias, back pain, arthralgias and neck pain.  Neurological: Negative for headaches.  All other systems reviewed and are negative.   Allergies  Review of patient's allergies indicates no known allergies.  Home Medications   Prior to Admission medications   Not on File   BP 102/79 mmHg  Pulse 126  Temp(Src) 99.3 F (37.4 C) (Oral)  Resp 18  SpO2 96% Physical Exam  Constitutional: She appears well-developed and well-nourished.  HENT:  Head: Normocephalic and atraumatic.  Mouth/Throat: Oropharynx is clear and moist.  Post-nasal drip  Eyes: Conjunctivae and EOM are normal. Pupils are equal, round, and reactive to light. Right eye exhibits no discharge. Left eye exhibits no discharge.  Neck: Normal range of motion. Neck supple. No JVD present. No tracheal deviation present.  Cardiovascular: Normal rate, regular rhythm and normal heart sounds.  Exam reveals  no gallop and no friction rub.   No murmur heard. Pulmonary/Chest: Effort normal and breath sounds normal. No stridor. No respiratory distress. She has no wheezes. She has no rales.  Abdominal: Soft. Bowel sounds are normal. She exhibits no distension. There is no tenderness. There is no rebound.  Musculoskeletal: Normal range of motion. She exhibits no edema or tenderness.  Lymphadenopathy:    She has no cervical adenopathy.  Neurological: She is alert. She has normal reflexes. Coordination normal.  Skin: Skin is warm and dry. No rash noted. She is not diaphoretic. No erythema.  Psychiatric: She has a normal mood and affect.  Nursing note and vitals reviewed.   ED Course  Procedures  DIAGNOSTIC STUDIES: Oxygen Saturation is 96% on RA, adequate by my interpretation.    COORDINATION OF CARE: 3:00 AM - Discussed plans to wait on diagnostic studies and imaging. Pt advised of plan for treatment and pt agrees.  Labs Review Labs Reviewed  CBC - Abnormal; Notable for the following:    WBC 11.0 (*)    RBC 5.45 (*)    MCV 73.8 (*)    MCH 24.6 (*)    All other components within normal limits  BASIC METABOLIC PANEL  I-STAT TROPOININ, ED  POC URINE PREG, ED    Imaging Review No results found. I have personally reviewed and evaluated these images and lab results as part of my medical decision-making.   EKG Interpretation None      MDM   Final diagnoses:  None    EKG Interpretation  Date/Time:  Friday February 12 2015 02:32:05 EST Ventricular Rate:  129 PR Interval:  124 QRS Duration: 64 QT Interval:  290 QTC Calculation: 424 R Axis:   55 Text Interpretation:  Sinus tachycardia Confirmed by [facility]  [facility], Andreu Drudge ([id number]) on 02/12/2015 3:22:08 AM       Symptoms consistent with viral URI.  Negative EKG and troponin.  Will treat symptomatically. Follow up with your PMD for ongoing care.  Has baseline tachycardia.  Now 96 HR.  Stable for discharge strict return  precautions given  I personally performed the services described in this documentation, which was scribed in my presence. The recorded information has been reviewed and is accurate.     Cy BlamerApril Jsiah Menta, MD 02/12/15 [phone]  Ruchi Stoney, MD 02/12/15 [phone]

## 2015-02-12 NOTE — ED Notes (Signed)
Pt. reports central chest pain with SOB and dry cough onset today  , pain increases when coughing and deep inspirations , denies nausea or diaphoresis .

## 2015-02-12 NOTE — ED Notes (Signed)
Dr. Nicanor AlconPalumbo to see and assess patient before RN assessment. See MD assessment.

## 2015-02-12 NOTE — ED Notes (Signed)
Patient verbalized understanding of discharge instructions and denies any further needs or questions at this time. VS stable. Patient ambulatory with steady gait.  

## 2015-08-01 ENCOUNTER — Encounter (HOSPITAL_COMMUNITY): Payer: Self-pay

## 2015-08-01 ENCOUNTER — Emergency Department (HOSPITAL_COMMUNITY)
Admission: EM | Admit: 2015-08-01 | Discharge: 2015-08-01 | Disposition: A | Discharge status: Home / Self Care | Payer: Medicaid Other | Attending: Emergency Medicine | Admitting: Emergency Medicine

## 2015-08-01 DIAGNOSIS — H00014 Hordeolum externum left upper eyelid: Secondary | ICD-10-CM | POA: Insufficient documentation

## 2015-08-01 DIAGNOSIS — H00016 Hordeolum externum left eye, unspecified eyelid: Secondary | ICD-10-CM

## 2015-08-01 DIAGNOSIS — H02844 Edema of left upper eyelid: Secondary | ICD-10-CM | POA: Diagnosis present

## 2015-08-01 NOTE — ED Notes (Signed)
Declined W/C at D/C and was escorted to lobby by RN. 

## 2015-08-01 NOTE — ED Provider Notes (Signed)
CSN: [id number]     Arrival date & time 08/01/15  1308 History  By signing my name below, I, Tanda Rockers, attest that this documentation has been prepared under the direction and in the presence of [employer], PA-C. Electronically Signed: Tanda Rockers, ED Scribe. 08/01/2015. 10:13 AM.   Chief Complaint  Patient presents with  . eyelid swelling    The history is provided by the patient. The history is limited by a language barrier. A language interpreter was used.   HPI Comments:Translator used [name] is a 25 y.o. female who presents to the Emergency Department complaining of gradual onset, constant, left upper eyelid swelling x 2 months. Pt also complains of stinging pain to the area. Pt has not seen anyone for these symptoms in the past 2 months. She has never had symptoms like this in the past. She has been applying warm compresses to the area without relief. Pt has not taken any medication for the pain. Denies fever, chills, visual changes, or any other associated symptoms.    Past Medical History  Diagnosis Date  . Aortic stenosis, severe     echo 9/12: EF 55-65%, severe AS, mean gradient 49 mmHg, unicuspid Aortic Valve  . Anemia   . Tachycardia   . Rheumatic heart disease 2011  . 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 No past surgeries     Family History  Problem Relation Age of Onset  . Asthma Other   . Anesthesia problems Neg Hx    Social History  Substance Use Topics  . Smoking status: Never Smoker   . Smokeless tobacco: Never Used  . Alcohol Use: No   OB History    Gravida Para Term Preterm AB TAB SAB Ectopic Multiple Living   Review of Systems  All other systems reviewed and are negative.  Allergies  Review of patient's allergies indicates no known allergies.  Home Medications   Prior to Admission medications   Medication Sig Start Date End Date Taking? Authorizing  Provider  benzonatate (TESSALON) 100 MG capsule Take 1 capsule (100 mg total) by mouth every 8 (eight) hours. 02/12/15   April Palumbo, MD  cetirizine (ZYRTEC ALLERGY) 10 MG tablet Take 1 tablet (10 mg total) by mouth daily. 02/12/15   April Palumbo, MD  fluticasone [facility]) 50 MCG/ACT nasal spray Place 2 sprays into both nostrils daily. 02/12/15   April Palumbo, MD   BP 106/87 mmHg  Pulse 76  Temp(Src) 99.3 F (37.4 C) (Oral)  Resp 18  SpO2 100%   Physical Exam  Constitutional: She is oriented to person, place, and time. She appears well-developed and well-nourished. No distress.  HENT:  Head: Normocephalic and atraumatic.  Eyes: Conjunctivae and EOM are normal. Left eye exhibits hordeolum.  Stye to upper left eyelid, appears chronic in nature  Neck: Neck supple. No tracheal deviation present.  Cardiovascular: Normal rate.   Pulmonary/Chest: Effort normal. No respiratory distress.  Musculoskeletal: Normal range of motion.  Neurological: She is alert and oriented to person, place, and time.  Skin: Skin is warm and dry.  Psychiatric: She has a normal mood and affect. Her behavior is normal.  Nursing note and vitals reviewed.   ED Course  Procedures (including critical care time)  DIAGNOSTIC STUDIES: Oxygen Saturation is 99% on RA, normal by my  interpretation.    COORDINATION OF CARE: 10:10 AM-Discussed treatment plan which includes follow up with opthalmologist with pt at bedside and pt agreed to plan.    MDM   Final diagnoses:  Stye, left   Labs: None  Imaging: None  Consults: None  Therapeutics: None  Discharge Meds: None  Assessment/Plan:Patient's presentation is most consistent with stye. No signs of infectious etiology on exam, no signs of preseptal septal cellulitis. She will be referred to ophthalmology for further evaluation as this been present for a significant amount of time. Patient verbalized understanding and agreement to today's plan and no further  questions or concerns at time of discharge     I personally performed the services described in this documentation, which was scribed in my presence. The recorded information has been reviewed and is accurate.      Eyvonne MechanicJeffrey Mazell Aylesworth, PA-C 08/01/15 1146  Gerhard Munchobert Lockwood, MD 08/02/15 [phone]

## 2015-08-01 NOTE — Discharge Instructions (Signed)
Stye A stye is a bump on your eyelid caused by a bacterial infection. A stye can form inside the eyelid (internal stye) or outside the eyelid (external stye). An internal stye may be caused by an infected oil-producing gland inside your eyelid. An external stye may be caused by an infection at the base of your eyelash (hair follicle). Styes are very common. Anyone can get them at any age. They usually occur in just one eye, but you may have more than one in either eye.  CAUSES  The infection is almost always caused by bacteria called Staphylococcus aureus. This is a common type of bacteria that lives on your skin. RISK FACTORS You may be at higher risk for a stye if you have had one before. You may also be at higher risk if you have:  Diabetes.  Long-term illness.  Long-term eye redness.  A skin condition called seborrhea.  High fat levels in your blood (lipids). SIGNS AND SYMPTOMS  Eyelid pain is the most common symptom of a stye. Internal styes are more painful than external styes. Other signs and symptoms may include:  Painful swelling of your eyelid.  A scratchy feeling in your eye.  Tearing and redness of your eye.  Pus draining from the stye. DIAGNOSIS  Your health care provider may be able to diagnose a stye just by examining your eye. The health care provider may also check to make sure:  You do not have a fever or other signs of a more serious infection.  The infection has not spread to other parts of your eye or areas around your eye. TREATMENT  Most styes will clear up in a few days without treatment. In some cases, you may need to use antibiotic drops or ointment to prevent infection. Your health care provider may have to drain the stye surgically if your stye is:  Large.  Causing a lot of pain.  Interfering with your vision. This can be done using a thin blade or a needle.  HOME CARE INSTRUCTIONS   Take medicines only as directed by your health care  provider.  Apply a clean, warm compress to your eye for 10 minutes, 4 times a day.  Do not wear contact lenses or eye makeup until your stye has healed.  Do not try to pop or drain the stye. SEEK MEDICAL CARE IF:  You have chills or a fever.  Your stye does not go away after several days.  Your stye affects your vision.  Your eyeball becomes swollen, red, or painful. MAKE SURE YOU:  Understand these instructions.  Will watch your condition.  Will get help right away if you are not doing well or get worse.   This information is not intended to replace advice given to you by your health care provider. Make sure you discuss any questions you have with your health care provider.   Document Released: 11/09/2004 Document Revised: 02/20/2014 Document Reviewed: 05/16/2013 Elsevier Interactive Patient Education 2016 Elsevier Inc.  

## 2015-08-01 NOTE — ED Notes (Signed)
Patient here with stye to left eyelid x 2 months, pain to same

## 2016-02-22 ENCOUNTER — Encounter (HOSPITAL_COMMUNITY): Payer: Self-pay

## 2016-02-22 ENCOUNTER — Emergency Department (HOSPITAL_COMMUNITY)
Admission: EM | Admit: 2016-02-22 | Discharge: 2016-02-23 | Disposition: A | Discharge status: Home / Self Care | Payer: BLUE CROSS/BLUE SHIELD | Attending: Emergency Medicine | Admitting: Emergency Medicine

## 2016-02-22 DIAGNOSIS — B029 Zoster without complications: Secondary | ICD-10-CM

## 2016-02-22 DIAGNOSIS — R220 Localized swelling, mass and lump, head: Secondary | ICD-10-CM | POA: Diagnosis present

## 2016-02-22 DIAGNOSIS — R590 Localized enlarged lymph nodes: Secondary | ICD-10-CM | POA: Insufficient documentation

## 2016-02-22 MED ORDER — FLUORESCEIN SODIUM 0.6 MG OP STRP
1.0000 | ORAL_STRIP | Freq: Once | OPHTHALMIC | Status: AC
  Administered 2016-02-22: 1 via OPHTHALMIC
  Filled 2016-02-22: qty 1

## 2016-02-22 MED ORDER — VALACYCLOVIR HCL 500 MG PO TABS
1000.0000 mg | ORAL_TABLET | Freq: Once | ORAL | Status: AC
  Administered 2016-02-23: 1000 mg via ORAL
  Filled 2016-02-22: qty 2

## 2016-02-22 MED ORDER — IBUPROFEN 800 MG PO TABS
800.0000 mg | ORAL_TABLET | Freq: Once | ORAL | Status: AC
  Administered 2016-02-22: 800 mg via ORAL
  Filled 2016-02-22: qty 1

## 2016-02-22 MED ORDER — TETRACAINE HCL 0.5 % OP SOLN
1.0000 [drp] | Freq: Once | OPHTHALMIC | Status: AC
  Administered 2016-02-22: 1 [drp] via OPHTHALMIC
  Filled 2016-02-22: qty 2

## 2016-02-22 MED ORDER — IBUPROFEN 600 MG PO TABS: 600.0000 mg | ORAL_TABLET | Freq: Four times a day (QID) | ORAL | 0 refills | Status: DC | PRN

## 2016-02-22 MED ORDER — VALACYCLOVIR HCL 1 G PO TABS: 1000.0000 mg | ORAL_TABLET | Freq: Three times a day (TID) | ORAL | 0 refills | Status: AC

## 2016-02-22 NOTE — ED Notes (Signed)
ED Provider at bedside. 

## 2016-02-22 NOTE — ED Provider Notes (Signed)
[facility] Provider Note   CSN: [id number] Arrival date & time: 02/22/16  1938   History   Chief Complaint Chief Complaint  Patient presents with  . Facial Swelling    HPI Patricia Frederick is a 26 y.o. female.   26 year old female present to the emergency department for evaluation of right-sided facial swelling. She states that the area of swelling is anterior to her right ear. She states that this area is also tender to touch. No medications taken prior to arrival. Symptoms associated with a rash next to the right eye x 2 days. She does complain of right eye discomfort, stating that it "feels hot". No vision loss. No sore throat, dentalgia, ear discharge, or ear pain. No fevers. Unknown hx of chicken pox. No hx of similar rashes.      Past Medical History:  Diagnosis Date  . Anemia   . Aortic stenosis, severe    echo 9/12: EF 55-65%, severe AS, mean gradient 49 mmHg, unicuspid Aortic Valve  . 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 Rheumatic heart disease 2011  . Tachycardia     Patient Active Problem List   Diagnosis Date Noted  . Other cardiovascular diseases of mother, antepartum 02/15/2011  . History of rheumatic heart disease 11/21/2010  . 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 Language barrier, speaks Nepali 11/14/2010  . Supervision of high-risk pregnancy 11/07/2010  . Aortic stenosis, severe   . Abnormal ECG 10/19/2010  . Tachycardia 10/19/2010    Past Surgical History:  Procedure Laterality Date  . NO PAST SURGERIES      OB History    Gravida Para Term Preterm AB Living   2 1 1     1    SAB TAB Ectopic Multiple Live Births           1       Home Medications    Prior to Admission medications   Medication Sig Start Date End Date Taking? Authorizing Provider  benzonatate (TESSALON) 100 MG capsule Take 1 capsule (100 mg total) by mouth every 8 (eight) hours. 02/12/15   April Palumbo, MD  cetirizine (ZYRTEC ALLERGY) 10 MG tablet Take  1 tablet (10 mg total) by mouth daily. 02/12/15   April Palumbo, MD  fluticasone [facility](FLONASE) 50 MCG/ACT nasal spray Place 2 sprays into both nostrils daily. 02/12/15   April Palumbo, MD  ibuprofen (ADVIL,MOTRIN) 600 MG tablet Take 1 tablet (600 mg total) by mouth every 6 (six) hours as needed for mild pain or moderate pain. 02/22/16   Antony MaduraKelly Valia Wingard, PA-C  valACYclovir (VALTREX) 1000 MG tablet Take 1 tablet (1,000 mg total) by mouth 3 (three) times daily. 02/22/16 03/07/16  Antony MaduraKelly Briant Angelillo, PA-C    Family History Family History  Problem Relation Age of Onset  . Asthma Other   . Anesthesia problems Neg Hx     Social History Social History  Substance Use Topics  . Smoking status: Never Smoker  . Smokeless tobacco: Never Used  . Alcohol use No     Allergies   Patient has no known allergies.   Review of Systems Review of Systems Ten systems reviewed and are negative for acute change, except as noted in the HPI.    Physical Exam Updated Vital Signs BP 106/77   Pulse 77   Temp 99.1 F (37.3 C) (Oral)   Resp 18   Ht 5\' 5"  (1.651 m)   SpO2 100%   Physical Exam  Constitutional: She is oriented to person, place, and time. She appears well-developed and well-nourished. No distress.  Nontoxic appearing and in NAD  HENT:  Head: Normocephalic and atraumatic.  No trismus. Clear posterior oropharynx. No tonsillar enlargement or exudates. Patient tolerating secretions. Clear right ear canal. Normal right TM.  Eyes: Conjunctivae and EOM are normal. Pupils are equal, round, and reactive to light. No scleral icterus.  EOMs normal without nystagmus. No proptosis or hyphema. No discharge from eyes. Reassuring fluorescein staining. No corneal abrasion or ulcer. No dendritic staining. Negative Seidel's sign.  Neck: Normal range of motion.  No nuchal rigidity or meningismus.  Cardiovascular: Normal rate, regular rhythm and intact distal pulses.   Pulmonary/Chest: Effort normal. No respiratory distress.    Respirations even and unlabored  Musculoskeletal: Normal range of motion.  Lymphadenopathy:       Head (right side): Preauricular (tender) adenopathy present.  Tender preauricular and parotid lymphadenopathy  Neurological: She is alert and oriented to person, place, and time. No cranial nerve deficit. She exhibits normal muscle tone. Coordination normal.  GCS 15. Patient moving all extremities; no focal deficits.  Skin: Skin is warm and dry. Rash (see image below) noted. She is not diaphoretic. No erythema. No pallor.  Rash lateral to the right eye  Psychiatric: She has a normal mood and affect. Her behavior is normal.  Nursing note and vitals reviewed.    ED Treatments / Results  Labs (all labs ordered are listed, but only abnormal results are displayed) Labs Reviewed  HIV ANTIBODY (ROUTINE TESTING)    EKG  EKG Interpretation None       Radiology No results found.  Procedures Procedures (including critical care time)  Medications Ordered in ED Medications  valACYclovir (VALTREX) tablet 1,000 mg (not administered)  tetracaine (PONTOCAINE) 0.5 % ophthalmic solution 1 drop (1 drop Right Eye Given 02/22/16 2329)  fluorescein ophthalmic strip 1 strip (1 strip Right Eye Given 02/22/16 2329)  ibuprofen (ADVIL,MOTRIN) tablet 800 mg (800 mg Oral Given 02/22/16 2329)          Initial Impression / Assessment and Plan / ED Course  I have reviewed the triage vital signs and the nursing notes.  Pertinent labs & imaging results that were available during my care of the patient were reviewed by me and considered in my medical decision making (see chart for details).  Clinical Course     26 year old female presents to the emergency department for evaluation of facial swelling on the right. Symptoms began today. They are associated with a rash to the right side of the face 2 days. This is lateral to the right eye and is clinically concerning for shingles. Rash location is  consistent with the dermatome associated with the maxillary division of the trigeminal nerve. Patient complaining of some burning to her right eye. She denies any pain with eye movement or vision changes. She has no dendritic staining on fluorescein exam. Facial pain is associated with tender lymphadenopathy. There is notable parotid and preauricular lymphadenopathy on exam. This is non-concerning. Plan to start on valacyclovir and ibuprofen. Patient referred to ophthalmology for follow-up. She has been told to return to the emergency department for any worsening symptoms. Return precautions given at discharge. Patient agreeable to plan with no unaddressed concerns; discharged in satisfactory condition.   Final Clinical Impressions(s) / ED Diagnoses   Final diagnoses:  Herpes zoster without complication  Lymphadenopathy of right cervical region    New Prescriptions New Prescriptions   IBUPROFEN (  ADVIL,MOTRIN) 600 MG TABLET    Take 1 tablet (600 mg total) by mouth every 6 (six) hours as needed for mild pain or moderate pain.   VALACYCLOVIR (VALTREX) 1000 MG TABLET    Take 1 tablet (1,000 mg total) by mouth 3 (three) times daily.     Antony Madura, PA-C 02/23/16 0011    Maia Plan, MD 02/23/16 [phone]

## 2016-02-22 NOTE — ED Triage Notes (Signed)
Onset today swelling to right side of face near ear and below ear.  No cough, cold, fever, or throat symptoms.

## 2016-02-22 NOTE — Discharge Instructions (Signed)
We believe that your facial rash is due to shingles. This is likely causing swelling of the lymph nodes on the right side of your face. Swollen lymph nodes can be painful and tender to the touch. We advise that you take ibuprofen for this pain. Take valacyclovir as prescribed for shingles. Follow up with an eye doctor (Dr. Genia DelMincey) as soon as possible to recheck your burning eye pain. Return to the ED for new or worsening symptoms.

## 2016-02-23 LAB — HIV ANTIBODY (ROUTINE TESTING W REFLEX): HIV Screen 4th Generation wRfx: NONREACTIVE

## 2018-04-16 ENCOUNTER — Ambulatory Visit (HOSPITAL_COMMUNITY)
Admission: EM | Admit: 2018-04-16 | Discharge: 2018-04-16 | Disposition: A | Discharge status: Home / Self Care | Payer: BLUE CROSS/BLUE SHIELD | Attending: Family Medicine | Admitting: Family Medicine

## 2018-04-16 ENCOUNTER — Other Ambulatory Visit: Payer: Self-pay

## 2018-04-16 ENCOUNTER — Encounter (HOSPITAL_COMMUNITY): Payer: Self-pay | Admitting: Emergency Medicine

## 2018-04-16 DIAGNOSIS — R42 Dizziness and giddiness: Secondary | ICD-10-CM

## 2018-04-16 DIAGNOSIS — B349 Viral infection, unspecified: Secondary | ICD-10-CM | POA: Diagnosis not present

## 2018-04-16 DIAGNOSIS — R011 Cardiac murmur, unspecified: Secondary | ICD-10-CM

## 2018-04-16 MED ORDER — IBUPROFEN 800 MG PO TABS: 800.0000 mg | ORAL_TABLET | Freq: Three times a day (TID) | ORAL | 0 refills | Status: DC

## 2018-04-16 MED ORDER — MECLIZINE HCL 12.5 MG PO TABS: 12.5000 mg | ORAL_TABLET | Freq: Three times a day (TID) | ORAL | 0 refills | Status: DC | PRN

## 2018-04-16 NOTE — Discharge Instructions (Signed)
Take meclizine as needed for dizziness.  May take 1 or 2 tablets up to 3 times a day. Take ibuprofen 3 times a day with food.  This is for pain, headache, fever Drink plenty of liquids Make an appointment with your primary care doctor for your ongoing medical needs

## 2018-04-16 NOTE — ED Triage Notes (Addendum)
Pt reports dizziness and body aches that started yesterday.  She also reports feeling warm, like a fever, but it has not been measured.  Pt has not taken any OTC medications.  There is a report of some sort of heart condition, but it is unclear what it is.

## 2018-04-16 NOTE — ED Provider Notes (Signed)
[facility]    CSN: [id number] Arrival date & time: 04/16/18  3016     History   Chief Complaint Chief Complaint  Patient presents with  . Dizziness  . Generalized Body Aches    HPI Patricia Frederick is a 28 y.o. female.   HPI  Patient is here for an upper respiratory infection.  Headache and dizziness.  She states she gets dizziness all the time, every couple of weeks she will have some brief dizziness.  When she has dizziness she also has nausea.  No head injury.  She has some runny stuffy nose and sore throat.  No cough.  Her 48-year-old at home also has an upper respiratory infection.  She is had no fever no chills.  No body aches.  No known exposure influenza She is here with her husband.  She is seen with assistance of a Publishing copy  History reviewed. No pertinent past medical history.  There are no active problems to display for this patient.   History reviewed. No pertinent surgical history.  OB History   No obstetric history on file.      Home Medications    Prior to Admission medications   Medication Sig Start Date End Date Taking? Authorizing Provider  ibuprofen (ADVIL,MOTRIN) 800 MG tablet Take 1 tablet (800 mg total) by mouth 3 (three) times daily. 04/16/18   Eustace Moore, MD  meclizine (ANTIVERT) 12.5 MG tablet Take 1-2 tablets (12.5-25 mg total) by mouth 3 (three) times daily as needed for dizziness. 04/16/18   Eustace Moore, MD    Family History History reviewed. No pertinent family history.  Social History Social History   Tobacco Use  . Smoking status: Never Smoker  . Smokeless tobacco: Never Used  Substance Use Topics  . Alcohol use: Never    Frequency: Never  . Drug use: Never     Allergies   Patient has no known allergies.   Review of Systems Review of Systems  Constitutional: Negative for chills and fever.  HENT: Positive for congestion, postnasal drip, rhinorrhea and sore throat. Negative for ear pain.   Eyes:  Positive for photophobia. Negative for pain and visual disturbance.  Respiratory: Negative for cough and shortness of breath.   Cardiovascular: Negative for chest pain and palpitations.  Gastrointestinal: Negative for abdominal pain and vomiting.  Genitourinary: Negative for dysuria and hematuria.  Musculoskeletal: Negative for arthralgias and back pain.  Skin: Negative for color change and rash.  Neurological: Positive for dizziness and headaches. Negative for seizures and syncope.  All other systems reviewed and are negative.    Physical Exam Triage Vital Signs ED Triage Vitals  Enc Vitals Group     BP 04/16/18 0920 105/76     Pulse Rate 04/16/18 0920 69     Resp --      Temp 04/16/18 0920 98.6 F (37 C)     Temp Source 04/16/18 0920 Oral     SpO2 04/16/18 0920 98 %     Weight --      Height --      Head Circumference --      Peak Flow --      Pain Score 04/16/18 0921 2     Pain Loc --      Pain Edu? --      Excl. in GC? --    No data found.  Updated Vital Signs BP 105/76 (BP Location: Right Arm)   Pulse 69   Temp 98.6  F (37 C) (Oral)   LMP 03/18/2018 (Exact Date)   SpO2 98%       Physical Exam Constitutional:      General: She is not in acute distress.    Appearance: She is well-developed.  HENT:     Head: Normocephalic and atraumatic.     Right Ear: Tympanic membrane, ear canal and external ear normal.     Left Ear: Tympanic membrane, ear canal and external ear normal.     Nose: Rhinorrhea present.     Mouth/Throat:     Mouth: Mucous membranes are moist.     Pharynx: No posterior oropharyngeal erythema.  Eyes:     Conjunctiva/sclera: Conjunctivae normal.     Pupils: Pupils are equal, round, and reactive to light.     Comments: No nystagmus appreciated  Neck:     Musculoskeletal: Normal range of motion.  Cardiovascular:     Rate and Rhythm: Normal rate and regular rhythm.     Heart sounds: Murmur present.     Comments: Prominent systolic murmur  right upper sternal border Pulmonary:     Effort: Pulmonary effort is normal. No respiratory distress.     Breath sounds: Normal breath sounds.  Abdominal:     General: There is no distension.     Palpations: Abdomen is soft.  Musculoskeletal: Normal range of motion.  Lymphadenopathy:     Cervical: No cervical adenopathy.  Skin:    General: Skin is warm and dry.  Neurological:     General: No focal deficit present.     Mental Status: She is alert.  Psychiatric:        Mood and Affect: Mood normal.        Behavior: Behavior normal.      UC Treatments / Results  Labs (all labs ordered are listed, but only abnormal results are displayed) Labs Reviewed - No data to display  EKG None  Radiology No results found.  Procedures Procedures (including critical care time)  Medications Ordered in UC Medications - No data to display  Initial Impression / Assessment and Plan / UC Course  I have reviewed the triage vital signs and the nursing notes.  Pertinent labs & imaging results that were available during my care of the patient were reviewed by me and considered in my medical decision making (see chart for details).     I recommend to the patient and her husband that she see a primary care doctor for her ongoing dizziness and for work-up of her heart murmur.  She states that they mentioned her her when she was pregnant with her 58-year-old, but since she has no symptoms she has not followed up. Final Clinical Impressions(s) / UC Diagnoses   Final diagnoses:  Dizziness  Viral illness  Heart murmur, systolic     Discharge Instructions     Take meclizine as needed for dizziness.  May take 1 or 2 tablets up to 3 times a day. Take ibuprofen 3 times a day with food.  This is for pain, headache, fever Drink plenty of liquids Make an appointment with your primary care doctor for your ongoing medical needs   ED Prescriptions    Medication Sig Dispense Auth. Provider    meclizine (ANTIVERT) 12.5 MG tablet Take 1-2 tablets (12.5-25 mg total) by mouth 3 (three) times daily as needed for dizziness. 30 tablet Eustace Moore, MD   ibuprofen (ADVIL,MOTRIN) 800 MG tablet Take 1 tablet (800 mg total) by mouth 3 (three)  times daily. 21 tablet Eustace Moore, MD     Controlled Substance Prescriptions [place] Controlled Substance Registry consulted? Not Applicable   Eustace Moore, MD 04/16/18 [phone]

## 2018-05-14 ENCOUNTER — Ambulatory Visit: Payer: BLUE CROSS/BLUE SHIELD | Admitting: Family Medicine

## 2018-07-03 DIAGNOSIS — Z1159 Encounter for screening for other viral diseases: Secondary | ICD-10-CM | POA: Diagnosis not present

## 2018-07-03 DIAGNOSIS — R05 Cough: Secondary | ICD-10-CM | POA: Diagnosis not present

## 2018-07-05 ENCOUNTER — Ambulatory Visit: Payer: BLUE CROSS/BLUE SHIELD | Admitting: Family Medicine

## 2018-07-10 ENCOUNTER — Encounter: Payer: Self-pay | Admitting: Family Medicine

## 2018-07-10 ENCOUNTER — Ambulatory Visit (INDEPENDENT_AMBULATORY_CARE_PROVIDER_SITE_OTHER): Payer: BLUE CROSS/BLUE SHIELD | Admitting: Family Medicine

## 2018-07-10 ENCOUNTER — Other Ambulatory Visit: Payer: Self-pay

## 2018-07-10 DIAGNOSIS — I350 Nonrheumatic aortic (valve) stenosis: Secondary | ICD-10-CM

## 2018-07-10 DIAGNOSIS — Z8679 Personal history of other diseases of the circulatory system: Secondary | ICD-10-CM | POA: Diagnosis not present

## 2018-07-10 DIAGNOSIS — R42 Dizziness and giddiness: Secondary | ICD-10-CM

## 2018-07-10 NOTE — Progress Notes (Deleted)
Called patient to initiate their telephone visit with provider Joaquin Courts, FNP-C. Verified date of birth. States that she is still having issues with dizziness, nausea & headaches. KWalker, CMA.

## 2018-07-10 NOTE — Progress Notes (Signed)
Virtual Visit via Telephone Note Merge of records has been requested.  Patient has duplicate medical records within the [facility] MRN: [id number]  I connected with Patricia Frederick on 07/10/18 at  1:30 PM EDT by telephone and verified that I am speaking with the correct person using two identifiers.  Location: Patient: Located at home during today's encounter  Provider: Located at primary care office   I discussed the limitations, risks, security and privacy concerns of performing an evaluation and management service by telephone and the availability of in person appointments. I also discussed with the patient that there may be a patient responsible charge related to this service. The patient expressed understanding and agreed to proceed.  History of Present Illness: Patricia Frederick was referred to establish care today by [facility]. Patricia Frederick has a language barrier and speaks Nepali and limited English therefore her spouse is assisting with interpretation. Patient was seen at urgent care in March for dizziness and uri symptoms. On exam, the provider notes a severe systolic murmur and recommended follow-up. Patricia Frederick reports that URI symptoms have completely resolved, however dizziness never improved. After further discussion, patient revealed that she was seen 7 years ago by a cardiologist once for evaluation of heart disease. After a deeper investigation, it appears that patient had a different name and has another EMR listed under MRN [id number] which has details of her extensive cardiac history. In a brief review of chart, patient has history of rheumatic fever.  She came to [place] 2012. OBGYN referred [facility]. Patient missed several appointments and at the time she finally followed up she was 6 months pregnant and had an echocardiogram which showed moderate aortic stenosis.  Treatment was deferred as patient was 6 months pregnant and she was to [facility] or seek cardiology postpartum.  Patient has not had any further cardiology follow-up since 2013. Patricia Frederick mentioned at some point surgery was discussed, she declined.  Patricia Frederick was recently seen at urgent care with a complaint of persistent dizziness.  At the time she was treated for an upper respiratory infection which has since resolved.  The provider was not privy to patient's cardiac history as this information was located under a different MRN number.  Patient complains of dizziness regardless of activity she endorses some fatigue.  She denies chest pain or history of syncope.  She is willing to follow-up with cardiology here in [place].  She has not had a primary care provider and therefore has not had any recent labs or complete physical. Assessment and Plan: 1. Aortic stenosis, severe Referring to cardiology. Given recent worsening of dizziness this symptom may be related worsening of cardiovascular disease.  2. History of rheumatic heart disease  3. Dizziness Failed to improve with meclizine.  Given the complexity of patient's medical history patient is being scheduled for an in-person visit next available.  Patient has also been referred to cardiology for further work-up and evaluation of history of aortic stenosis and recent finding of a prominent systolic murmur.    Follow Up Instructions: CPE with full panel of labs schedule next available.     I discussed the assessment and treatment plan with the patient. The patient was provided an opportunity to ask questions and all were answered. The patient agreed with the plan and demonstrated an understanding of the instructions.   The patient was advised to call back or seek an in-person evaluation if the symptoms worsen or if  the condition fails to improve as anticipated.  I provided 35 minutes of non-face-to-face time during this encounter.   Joaquin Courts, FNP-C

## 2018-07-11 ENCOUNTER — Encounter (HOSPITAL_COMMUNITY): Payer: Self-pay

## 2018-07-15 ENCOUNTER — Ambulatory Visit (INDEPENDENT_AMBULATORY_CARE_PROVIDER_SITE_OTHER): Payer: BLUE CROSS/BLUE SHIELD | Admitting: Family Medicine

## 2018-07-15 ENCOUNTER — Encounter: Payer: Self-pay | Admitting: Family Medicine

## 2018-07-15 ENCOUNTER — Other Ambulatory Visit: Payer: Self-pay

## 2018-07-15 ENCOUNTER — Other Ambulatory Visit (HOSPITAL_COMMUNITY)
Admission: RE | Admit: 2018-07-15 | Discharge: 2018-07-15 | Disposition: A | Discharge status: Home / Self Care | Payer: BLUE CROSS/BLUE SHIELD | Source: Ambulatory Visit | Attending: Family Medicine | Admitting: Family Medicine

## 2018-07-15 VITALS — BP 118/86 | HR 81 | Temp 97.9°F | Ht 65.0 in | Wt 128.0 lb

## 2018-07-15 DIAGNOSIS — Z1329 Encounter for screening for other suspected endocrine disorder: Secondary | ICD-10-CM

## 2018-07-15 DIAGNOSIS — Z0000 Encounter for general adult medical examination without abnormal findings: Secondary | ICD-10-CM

## 2018-07-15 DIAGNOSIS — Z3202 Encounter for pregnancy test, result negative: Secondary | ICD-10-CM | POA: Diagnosis not present

## 2018-07-15 DIAGNOSIS — Z130 Encounter for screening for diseases of the blood and blood-forming organs and certain disorders involving the immune mechanism: Secondary | ICD-10-CM | POA: Diagnosis not present

## 2018-07-15 DIAGNOSIS — Z01419 Encounter for gynecological examination (general) (routine) without abnormal findings: Secondary | ICD-10-CM | POA: Diagnosis not present

## 2018-07-15 DIAGNOSIS — Z131 Encounter for screening for diabetes mellitus: Secondary | ICD-10-CM

## 2018-07-15 DIAGNOSIS — Z1389 Encounter for screening for other disorder: Secondary | ICD-10-CM | POA: Diagnosis not present

## 2018-07-15 DIAGNOSIS — Z13220 Encounter for screening for lipoid disorders: Secondary | ICD-10-CM

## 2018-07-15 LAB — POCT URINALYSIS DIP (CLINITEK)
Bilirubin, UA: NEGATIVE
Blood, UA: NEGATIVE
Glucose, UA: NEGATIVE mg/dL
Ketones, POC UA: NEGATIVE mg/dL
Leukocytes, UA: NEGATIVE
Nitrite, UA: NEGATIVE
POC PROTEIN,UA: NEGATIVE
Spec Grav, UA: 1.015 (ref 1.010–1.025)
Urobilinogen, UA: 0.2 E.U./dL
pH, UA: 6.5 (ref 5.0–8.0)

## 2018-07-15 LAB — POCT URINE PREGNANCY: Preg Test, Ur: NEGATIVE

## 2018-07-15 NOTE — Patient Instructions (Signed)
Thank you for choosing Primary Care at [facility] to be your medical home!    Patricia Frederick was seen by Joaquin Courts, FNP today.   Patricia Frederick's primary care provider is Bing Neighbors, FNP.   For the best care possible, you should try to see Joaquin Courts, FNP-C whenever you come to the clinic.   We look forward to seeing you again soon!  If you have any questions about your visit today, please call us at [phone] or feel free to reach your primary care provider via MyChart.

## 2018-07-15 NOTE — Progress Notes (Signed)
Patient ID: Apiphany Trochez, female    DOB: 08/06/1990, 28 y.o.   MRN: [id number]  PCP: Bing Neighbors, FNP  Chief Complaint  Patient presents with  . Annual Exam    Subjective:  HPI  Billy Maya Darji Jovita Kussmaul is a 28 y.o. female presents an annual physical.  Ismahan Maya Darji Jovita Kussmaul has Abnormal ECG; Tachycardia; Aortic stenosis, severe; Supervision of high-risk pregnancy; Language barrier, speaks [place]; History of rheumatic heart disease; Hemoglobin E trait (HCC); and Other cardiovascular diseases of mother, antepartum on their problem list.    Aortic Stenosis /Dizziness  Referred to cardiology during recent telemedicine visit.  Prior visit-07/10/18 Seren was referred to establish care today by [facility]. Kehaulani has a language barrier and speaks Nepali and limited English therefore her spouse is assisting with interpretation. Patient was seen at urgent care in March for dizziness and uri symptoms. On exam, the provider notes a severe systolic murmur and recommended follow-up. Ronia reports that URI symptoms have completely resolved, however dizziness never improved. After further discussion, patient revealed that she was seen 7 years ago by a cardiologist once for evaluation of heart disease. After a deeper investigation, it appears that patient had a different name and has another EMR listed under MRN [id number] which has details of her extensive cardiac history. In a brief review of chart, patient has history of rheumatic fever.  She came to [place] 2012. OBGYN referred patient to [facility] clinic. Patient missed several appointments and at the time she finally followed up she was 6 months pregnant and had an echocardiogram which showed moderate aortic stenosis.  Treatment was deferred as patient was 6 months pregnant and she was to follow-up with [facility] cardiology or seek cardiology postpartum.  Patient has not had any further cardiology follow-up since 2013.  Cameren mentioned at some point surgery was discussed, she declined.  Sheyna was recently seen at urgent care with a complaint of persistent dizziness.  At the time she was treated for an upper respiratory infection which has since resolved.  The provider was not privy to patient's cardiac history as this information was located under a different MRN number.  Patient complains of dizziness regardless of activity she endorses some fatigue.  She denies chest pain or history of syncope.  She is willing to follow-up with cardiology here in [place].  She has not had a primary care provider and therefore has not had any recent labs or complete physical.  CPE w/PAP-Today 07/15/18 See prior medical history above. Last PAP 2011. Thinks results were normal. Denies any vaginal pain or worrisome symptoms. Normal menstrual cycle. Denies heavy of excessive bleeding. She is married and has one child.   She is overdue for TDAP and PAP which will receive today.  Denies any other concerns today.  Social History   Socioeconomic History  . Marital status: Married    Spouse name: Not on file  . Number of children: Not on file  . Years of education: Not on file  . Highest education level: Not on file  Occupational History  . Not on file  Social Needs  . Financial resource strain: Not on file  . Food insecurity:    Worry: Not on file    Inability: Not on file  . Transportation needs:    Medical: Not on file    Non-medical: Not on file  Tobacco Use  . Smoking status: Never Smoker  . Smokeless tobacco: Never Used  Substance  and Sexual Activity  . Alcohol use: Never    Frequency: Never  . Drug use: Never  . Sexual activity: Yes  Lifestyle  . Physical activity:    Days per week: Not on file    Minutes per session: Not on file  . Stress: Not on file  Relationships  . Social connections:    Talks on phone: Not on file    Gets together: Not on file    Attends religious service: Not on file    Active  member of club or organization: Not on file    Attends meetings of clubs or organizations: Not on file    Relationship status: Not on file  . Intimate partner violence:    Fear of current or ex partner: Not on file    Emotionally abused: Not on file    Physically abused: Not on file    Forced sexual activity: Not on file  Other Topics Concern  . Not on file  Social History Narrative   ** Merged History Encounter **        Family History  Problem Relation Age of Onset  . Stroke Neg Hx   . Cancer Neg Hx   . Asthma Other   . Anesthesia problems Neg Hx      Review of Systems Pertinent negatives listed in HPI No Known Allergies  Prior to Admission medications   Not on File    Past Medical, Surgical Family and Social History reviewed and updated.    Objective:   Today's Vitals   07/15/18 1515 07/15/18 1516  BP: 118/86   Pulse: 81   Temp: 97.9 F (36.6 C)   TempSrc: Oral   SpO2: 98%   Weight: 128 lb (58.1 kg)   Height:  (1.651 m)   PainSc: 0-No pain 0-No pain    BP Readings from Last 3 Encounters:  07/15/18 118/86  04/16/18 105/76  02/22/16 96/77    Filed Weights   07/15/18 1515  Weight: 128 lb (58.1 kg)       Physical Exam General appearance: alert, well developed, well nourished, cooperative and in no distress Head: Normocephalic, without obvious abnormality, atraumatic Respiratory: Respirations even and unlabored, normal respiratory rate Heart: rate and rhythm normal. Positive Harsh murmur 2-3 ICS noted during exam. Extremities: No gross deformities Genitourinary: Normal female external genitalia without lesion. No inguinal lymphadenopathy. Vaginal mucosa is pink and moist without lesions. Cervix is closed without discharge, not friable. Pap smear obtained. No cervical motion tenderness, adnexal fullness or tenderness. Skin: Skin color, texture, turgor normal. No rashes seen  Psych: Appropriate mood and affect. Neurologic: Mental status: Alert,  oriented to person, place, and time, thought content appropriate.  Assessment & Plan:  1. Screening for blood or protein in urine - POCT URINALYSIS DIP (CLINITEK) - POCT urine pregnancy  2. Cervical smear, as part of routine gynecological examination - Cytology - PAP(Williams) - Cervicovaginal ancillary only  3. Screening for diabetes mellitus - Comprehensive metabolic panel - Hemoglobin A1c  4. Screening, lipid -Will obtain at subsequent visit. Patient non-fasting.  5. Screening for deficiency anemia - CBC with Differential  6. Screening for thyroid disorder - Thyroid Panel With TSH  7. Annual physical exam Age specific anticipatory guidance provided    Orders Placed This Encounter  Procedures  . Comprehensive metabolic panel  . CBC with Differential  . Thyroid Panel With TSH  . Hemoglobin A1c  . POCT URINALYSIS DIP (CLINITEK)  . POCT urine pregnancy  Joaquin CourtsKimberly Winta Barcelo, FNP [facility] [address], [place] [id number] [phone]: [phone]

## 2018-07-15 NOTE — Progress Notes (Deleted)
Complete Physical/ patient would like to have her TDap today.

## 2018-07-16 LAB — COMPREHENSIVE METABOLIC PANEL
ALT: 23 IU/L (ref 0–32)
AST: 23 IU/L (ref 0–40)
Albumin/Globulin Ratio: 1.5 (ref 1.2–2.2)
Albumin: 4.6 g/dL (ref 3.9–5.0)
Alkaline Phosphatase: 99 IU/L (ref 39–117)
BUN/Creatinine Ratio: 12 (ref 9–23)
BUN: 7 mg/dL (ref 6–20)
Bilirubin Total: 0.3 mg/dL (ref 0.0–1.2)
CO2: 23 mmol/L (ref 20–29)
Calcium: 9.5 mg/dL (ref 8.7–10.2)
Chloride: 103 mmol/L (ref 96–106)
Creatinine, Ser: 0.59 mg/dL (ref 0.57–1.00)
GFR calc Af Amer: 144 mL/min/{1.73_m2} (ref >59)
GFR calc non Af Amer: 125 mL/min/{1.73_m2} (ref >59)
Globulin, Total: 3 g/dL (ref 1.5–4.5)
Glucose: 104 mg/dL — ABNORMAL HIGH (ref 65–99)
Potassium: 4.3 mmol/L (ref 3.5–5.2)
Sodium: 141 mmol/L (ref 134–144)
Total Protein: 7.6 g/dL (ref 6.0–8.5)

## 2018-07-16 LAB — THYROID PANEL WITH TSH
Free Thyroxine Index: 2 (ref 1.2–4.9)
T3 Uptake Ratio: 24 % (ref 24–39)
T4, Total: 8.2 ug/dL (ref 4.5–12.0)
TSH: 2.74 u[IU]/mL (ref 0.450–4.500)

## 2018-07-16 LAB — CBC WITH DIFFERENTIAL/PLATELET
Basophils Absolute: 0.1 10*3/uL (ref 0.0–0.2)
Basos: 1 %
EOS (ABSOLUTE): 0.6 10*3/uL — ABNORMAL HIGH (ref 0.0–0.4)
Eos: 5 %
Hematocrit: 39.4 % (ref 34.0–46.6)
Hemoglobin: 12.9 g/dL (ref 11.1–15.9)
Immature Grans (Abs): 0.1 10*3/uL (ref 0.0–0.1)
Immature Granulocytes: 1 %
Lymphocytes Absolute: 3.6 10*3/uL — ABNORMAL HIGH (ref 0.7–3.1)
Lymphs: 30 %
MCH: 24.1 pg — ABNORMAL LOW (ref 26.6–33.0)
MCHC: 32.7 g/dL (ref 31.5–35.7)
MCV: 74 fL — ABNORMAL LOW (ref 79–97)
Monocytes Absolute: 0.6 10*3/uL (ref 0.1–0.9)
Monocytes: 5 %
Neutrophils Absolute: 7.1 10*3/uL — ABNORMAL HIGH (ref 1.4–7.0)
Neutrophils: 58 %
Platelets: 336 10*3/uL (ref 150–450)
RBC: 5.36 x10E6/uL — ABNORMAL HIGH (ref 3.77–5.28)
RDW: 13.9 % (ref 11.7–15.4)
WBC: 12.2 10*3/uL — ABNORMAL HIGH (ref 3.4–10.8)

## 2018-07-16 LAB — HEMOGLOBIN A1C
Est. average glucose Bld gHb Est-mCnc: 117 mg/dL
Hgb A1c MFr Bld: 5.7 % — ABNORMAL HIGH (ref 4.8–5.6)

## 2018-07-17 LAB — CERVICOVAGINAL ANCILLARY ONLY
Bacterial vaginitis: NEGATIVE
Candida vaginitis: NEGATIVE
Chlamydia: NEGATIVE
Neisseria Gonorrhea: NEGATIVE
Trichomonas: NEGATIVE

## 2018-07-18 LAB — CYTOLOGY - PAP: Diagnosis: NEGATIVE

## 2018-07-25 NOTE — Progress Notes (Signed)
Patient notified of results & recommendations via [employer](Basu [phone]). Expressed understanding.

## 2018-09-27 ENCOUNTER — Encounter: Payer: Self-pay | Admitting: *Deleted

## 2018-12-27 ENCOUNTER — Other Ambulatory Visit: Payer: Self-pay

## 2018-12-27 DIAGNOSIS — Z20822 Contact with and (suspected) exposure to covid-19: Secondary | ICD-10-CM

## 2018-12-30 LAB — NOVEL CORONAVIRUS, NAA: SARS-CoV-2, NAA: NOT DETECTED

## 2019-06-29 ENCOUNTER — Emergency Department (HOSPITAL_COMMUNITY)
Admission: EM | Admit: 2019-06-29 | Discharge: 2019-06-29 | Disposition: A | Discharge status: Home / Self Care | Payer: BC Managed Care – PPO | Attending: Emergency Medicine | Admitting: Emergency Medicine

## 2019-06-29 ENCOUNTER — Encounter (HOSPITAL_COMMUNITY): Payer: Self-pay | Admitting: Emergency Medicine

## 2019-06-29 ENCOUNTER — Emergency Department (HOSPITAL_COMMUNITY): Payer: BC Managed Care – PPO

## 2019-06-29 ENCOUNTER — Other Ambulatory Visit: Payer: Self-pay

## 2019-06-29 DIAGNOSIS — R0789 Other chest pain: Secondary | ICD-10-CM | POA: Diagnosis not present

## 2019-06-29 DIAGNOSIS — R079 Chest pain, unspecified: Secondary | ICD-10-CM | POA: Diagnosis not present

## 2019-06-29 LAB — BASIC METABOLIC PANEL
Anion gap: 9 (ref 5–15)
BUN: 6 mg/dL (ref 6–20)
CO2: 24 mmol/L (ref 22–32)
Calcium: 9.4 mg/dL (ref 8.9–10.3)
Chloride: 106 mmol/L (ref 98–111)
Creatinine, Ser: 0.53 mg/dL (ref 0.44–1.00)
GFR calc Af Amer: >60 mL/min (ref >60)
GFR calc non Af Amer: >60 mL/min (ref >60)
Glucose, Bld: 119 mg/dL — ABNORMAL HIGH (ref 70–99)
Potassium: 3.7 mmol/L (ref 3.5–5.1)
Sodium: 139 mmol/L (ref 135–145)

## 2019-06-29 LAB — CBC
HCT: 39.8 % (ref 36.0–46.0)
Hemoglobin: 13 g/dL (ref 12.0–15.0)
MCH: 24.6 pg — ABNORMAL LOW (ref 26.0–34.0)
MCHC: 32.7 g/dL (ref 30.0–36.0)
MCV: 75.4 fL — ABNORMAL LOW (ref 80.0–100.0)
Platelets: 326 10*3/uL (ref 150–400)
RBC: 5.28 MIL/uL — ABNORMAL HIGH (ref 3.87–5.11)
RDW: 13.2 % (ref 11.5–15.5)
WBC: 11.6 10*3/uL — ABNORMAL HIGH (ref 4.0–10.5)
nRBC: 0 % (ref 0.0–0.2)

## 2019-06-29 LAB — TROPONIN I (HIGH SENSITIVITY)
Troponin I (High Sensitivity): 3 ng/L (ref <18)
Troponin I (High Sensitivity): 4 ng/L (ref <18)

## 2019-06-29 LAB — I-STAT BETA HCG BLOOD, ED (MC, WL, AP ONLY): I-stat hCG, quantitative: <5 m[IU]/mL (ref <5)

## 2019-06-29 MED ORDER — ASPIRIN EC 325 MG PO TBEC
325.0000 mg | DELAYED_RELEASE_TABLET | Freq: Four times a day (QID) | ORAL | 0 refills | Status: DC | PRN
Start: 2019-06-29 — End: 2019-08-19

## 2019-06-29 MED ORDER — SODIUM CHLORIDE 0.9% FLUSH: 3.0000 mL | Freq: Once | INTRAVENOUS | Status: DC

## 2019-06-29 NOTE — Discharge Instructions (Signed)
You have been evaluated for your chest pain.  Fortunately labs and EKG along with chest x-ray today are normal appearing.  However due to history of rheumatic heart disease, it is important to follow-up closely with cardiologist for further evaluation of your condition.  Take aspirin as needed for pain.  Return if you have any concern.

## 2019-06-29 NOTE — ED Triage Notes (Signed)
Pt c/o chest pain and dizziness that started yesterday.

## 2019-06-29 NOTE — ED Provider Notes (Signed)
[facility] EMERGENCY DEPARTMENT Provider Note   CSN: [id number] Arrival date & time: 06/29/19  1536     History Chief Complaint  Patient presents with  . Chest Pain    Patricia Frederick is a 29 y.o. female.  The history is provided by the patient. No language interpreter was used.  Chest Pain    29 year old female, Nepali speaking, with history of of severe aortic stenosis, rheumatic heart disease, hemoglobin C trait, anemia, presenting for evaluation of chest pain.  Patient report yesterday at work she developed pain to her chest.  Pain is primarily in her mid chest, radiates towards her neck and up towards her head with sensation of dizziness pain which she describes as very lightheaded.  Pain was waxing and waning, but presents throughout the day today.  States pain is now at this time.  She does not report of any associated fever chills productive cough shortness of breath nauseous diaphoresis vomiting diarrhea back pain.  She admits to eating spicy food but states she ate spicy food on a regular basis.  She report having similar pain like this in the past but this is more severe.  She does not have any primary care provider at this time.  No specific treatment tried.  Past Medical History:  Diagnosis Date  . Anemia   . Aortic stenosis, severe    echo 9/12: EF 55-65%, severe AS, mean gradient 49 mmHg, unicuspid Aortic Valve  . 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 Rheumatic heart disease 2011  . Tachycardia     Patient Active Problem List   Diagnosis Date Noted  . Other cardiovascular diseases of mother, antepartum 02/15/2011  . History of rheumatic heart disease 11/21/2010  . 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 Language barrier, speaks Nepali 11/14/2010  . Supervision of high-risk pregnancy 11/07/2010  . Aortic stenosis, severe   . Abnormal ECG 10/19/2010  . Tachycardia 10/19/2010    Past Surgical History:  Procedure Laterality Date   . CESAREAN SECTION    . NO PAST SURGERIES       OB History    Gravida  2   Para  1   Term  1   Preterm  0   AB  0   Living  1     SAB  0   TAB  0   Ectopic  0   Multiple      Live Births  1           Family History  Problem Relation Age of Onset  . Stroke Neg Hx   . Cancer Neg Hx   . Asthma Other   . Anesthesia problems Neg Hx     Social History   Tobacco Use  . Smoking status: Never Smoker  . Smokeless tobacco: Never Used  Substance Use Topics  . Alcohol use: Never  . Drug use: Never    Home Medications Prior to Admission medications   Not on File    Allergies    Patient has no known allergies.  Review of Systems   Review of Systems  Cardiovascular: Positive for chest pain.  All other systems reviewed and are negative.   Physical Exam Updated Vital Signs BP 111/88 (BP Location: Right Arm)   Pulse 68   Temp 98.4 F (36.9 C) (Oral)   Resp 18   SpO2 100%   Physical Exam Vitals and nursing note reviewed.  Constitutional:      General: She is not in acute distress.    Appearance: She is well-developed.     Comments: Sitting in bed on her phone appears to be in no acute discomfort.  HENT:     Head: Atraumatic.  Eyes:     Conjunctiva/sclera: Conjunctivae normal.  Cardiovascular:     Rate and Rhythm: Normal rate and regular rhythm.     Heart sounds: Murmur present.  Pulmonary:     Effort: Pulmonary effort is normal.     Breath sounds: Normal breath sounds.  Chest:     Chest wall: Tenderness (Mild tenderness to epigastric and left upper chest anterior chest wall no overlying skin changes.) present.  Abdominal:     Palpations: Abdomen is soft.     Tenderness: There is no abdominal tenderness.  Musculoskeletal:     Cervical back: Neck supple.     Right lower leg: No edema.     Left lower leg: No edema.  Skin:    Findings: No rash.  Neurological:     Mental Status: She is alert and oriented to person, place, and time.   Psychiatric:        Mood and Affect: Mood normal.     ED Results / Procedures / Treatments   Labs (all labs ordered are listed, but only abnormal results are displayed) Labs Reviewed  BASIC METABOLIC PANEL - Abnormal; Notable for the following components:      Result Value   Glucose, Bld 119 (*)    All other components within normal limits  CBC - Abnormal; Notable for the following components:   WBC 11.6 (*)    RBC 5.28 (*)    MCV 75.4 (*)    MCH 24.6 (*)    All other components within normal limits  I-STAT BETA HCG BLOOD, ED (MC, WL, AP ONLY)  TROPONIN I (HIGH SENSITIVITY)  TROPONIN I (HIGH SENSITIVITY)    EKG EKG Interpretation  Date/Time:  Sunday Jun 29 2019 16:04:57 EDT Ventricular Rate:  72 PR Interval:  138 QRS Duration: 76 QT Interval:  358 QTC Calculation: 392 R Axis:   34 Text Interpretation: Normal sinus rhythm with sinus arrhythmia Normal ECG Confirmed by Quintella Reichert [phone]) on 06/29/2019 6:21:24 PM    Date: 06/29/2019  Rate: 72  Rhythm: normal sinus rhythm with sinus arrhythmia  QRS Axis: normal  Intervals: normal  ST/T Wave abnormalities: normal  Conduction Disutrbances: none  Narrative Interpretation:   Old EKG Reviewed: No significant changes noted     Radiology DG Chest 2 View  Result Date: 06/29/2019 CLINICAL DATA:  Chest pain EXAM: CHEST - 2 VIEW COMPARISON:  02/12/2015 FINDINGS: The heart size and mediastinal contours are within normal limits. No focal airspace consolidation, pleural effusion, or pneumothorax. The visualized skeletal structures are unremarkable. IMPRESSION: No active cardiopulmonary disease. Electronically Signed   By: Davina Poke D.O.   On: 06/29/2019 16:27    Procedures Procedures (including critical care time)  Medications Ordered in ED Medications  sodium chloride flush (NS) 0.9 % injection 3 mL (has no administration in time range)    ED Course  I have reviewed the triage vital signs and the nursing  notes.  Pertinent labs & imaging results that were available during my care of the patient were reviewed by me and considered in my medical decision making (see chart for details).    MDM Rules/Calculators/A&P  BP 111/88 (BP Location: Right Arm)   Pulse 68   Temp 98.4 F (36.9 C) (Oral)   Resp 18   SpO2 100%   Final Clinical Impression(s) / ED Diagnoses Final diagnoses:  Atypical chest pain    Rx / DC Orders ED Discharge Orders         Ordered    aspirin EC 325 MG tablet  Every 6 hours PRN     06/29/19 1957         6:07 PM Patient with intermittent sternal chest pain and left upper chest pain since yesterday.  Pain associated with some dizziness.  She is well-appearing.  Does have history of aortic stenosis.  Appreciable murmur heard on auscultation of the heart.  EKG shows normal sinus rhythm with sinus arrhythmia.  Labs are reassuring, normal troponin, will obtain delta troponin.  Will check orthostatic vital sign.  7:54 PM Normal orthostatic vital sign.  Normal delta troponin.  Patient is currently symptom-free and resting comfortably.  Given history of rheumatic heart disease, will recommend NSAIDs as needed for pain.  Encourage patient to follow-up closely with cardiologist for outpatient evaluation.  Return precaution discussed.  Care discussed with Dr. Madilyn Hook.   Fayrene Helper, PA-C 06/29/19 1958    Tilden Fossa, MD 06/30/19 [phone]

## 2019-08-18 NOTE — Progress Notes (Signed)
CARDIOLOGY CONSULT NOTE       Patient ID: Patricia Frederick MRN: [id number] DOB/AGE: 04-01-1990 29 y.o.  Referring Physician: Fayrene Helper PA-C  Primary Physician: Bing Neighbors, FNP Primary Cardiologist: New Reason for Consultation: Aortic Stenosis / Chest Pain   Active Problems:   * No active hospital problems. *  Interpretor  Used through out exam as patient does not speak english   HPI:  29 y.o. referred by [facility] ER Fayrene Helper PA- c for chest pain and aortic stenosis . Seen in ED 06/30/19 with chest pain / dizziness r/o normal rhythm not orthostatic and d/c home Last echo 10/27/10 ? Unicuspid valve with mean gradient 49 mmHg peak 93 mmHg done when she was 5 months pregnant and patient never f/u with cardiology   She is from [place] Has a 30 yo and 82 yo child Care and lack of AVR to date related to non compliance and language barrier   She has had some chest pain with exertion and some lightheadedness sitting that extends up her neck With left eye pain. She indicates not seeing dentist for over a year and having some dental issues   Long discussion with her about likely need for mechanical AVR. No bleeding issues.   ROS All other systems reviewed and negative except as noted above  Past Medical History:  Diagnosis Date  . Anemia   . Aortic stenosis, severe    echo 9/12: EF 55-65%, severe AS, mean gradient 49 mmHg, unicuspid Aortic Valve  . 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 Rheumatic heart disease 2011  . Tachycardia     Family History  Problem Relation Age of Onset  . Stroke Neg Hx   . Cancer Neg Hx   . Asthma Other   . Anesthesia problems Neg Hx     Social History   Socioeconomic History  . Marital status: Married    Spouse name: Not on file  . Number of children: Not on file  . Years of education: Not on file  . Highest education level: Not on file  Occupational History  . Not on file  Tobacco Use  . Smoking status: Never Smoker  .  Smokeless tobacco: Never Used  Vaping Use  . Vaping Use: Never used  Substance and Sexual Activity  . Alcohol use: Never  . Drug use: Never  . Sexual activity: Yes  Other Topics Concern  . Not on file  Social History Narrative   ** Merged History Encounter **       Social Determinants of Health   Financial Resource Strain:   . Difficulty of Paying Living Expenses:   Food Insecurity:   . Worried About Programme researcher, broadcasting/film/video in the Last Year:   . Barista in the Last Year:   Transportation Needs:   . Freight forwarder (Medical):   Marland Kitchen Lack of Transportation (Non-Medical):   Physical Activity:   . Days of Exercise per Week:   . Minutes of Exercise per Session:   Stress:   . Feeling of Stress :   Social Connections:   . Frequency of Communication with Friends and Family:   . Frequency of Social Gatherings with Friends and Family:   . Attends Religious Services:   . Active Member of Clubs or Organizations:   . Attends Banker Meetings:   Marland Kitchen Marital Status:   Intimate Partner Violence:   . Fear of  Current or Ex-Partner:   . Emotionally Abused:   Marland Kitchen Physically Abused:   . Sexually Abused:     Past Surgical History:  Procedure Laterality Date  . CESAREAN SECTION    . NO PAST SURGERIES        Current Outpatient Medications:  .  aspirin EC 325 MG tablet, Take 1 tablet (325 mg total) by mouth every 6 (six) hours as needed for mild pain or moderate pain., Disp: 30 tablet, Rfl: 0    Physical Exam: There were no vitals taken for this visit.    Affect appropriate Healthy:  appears stated age HEENT: normal Neck supple with no adenopathy JVP normal no bruits no thyromegaly Lungs clear with no wheezing and good diaphragmatic motion Heart:  S1/S2 preserved severe AS  murmur, no rub, gallop or click PMI normal Abdomen: benighn, BS positve, no tenderness, no AAA no bruit.  No HSM or HJR Distal pulses intact with no bruits No edema Neuro non-focal Skin  warm and dry No muscular weakness   Labs:   Lab Results  Component Value Date   WBC 11.6 (H) 06/29/2019   HGB 13.0 06/29/2019   HCT 39.8 06/29/2019   MCV 75.4 (L) 06/29/2019   PLT 326 06/29/2019   No results for input(s): NA, K, CL, CO2, BUN, CREATININE, CALCIUM, PROT, BILITOT, ALKPHOS, ALT, AST, GLUCOSE in the last 168 hours.  Invalid input(s): LABALBU No results found for: CKTOTAL, CKMB, CKMBINDEX, TROPONINI No results found for: CHOL No results found for: HDL No results found for: LDLCALC No results found for: TRIG No results found for: CHOLHDL No results found for: LDLDIRECT    Radiology: No results found.  EKG: SR rate 72 normal 06/30/19    ASSESSMENT AND PLAN:   1. Aortic Valve Disease: Unsubtly critical AS update echo refer to CVTS. Chest pain and dizziness likely from critical valve disease. Would be ideal to assess valve morphology, coronary arteries and entire aorta with gated cardiac CTA Use retrospective TAVR protocol with only one nitro and beta block Scan from clavicles down Will try to avoid invasive heart catheterization Will need BMET today for contrast CR normal on 06/29/19 Hopefully this will be adequate although not clear that patient can cooperate enough to get ideal results. Needs chest CT anyway to assess aorta so will start there and if scan not ideal will need right and left heart cath She will contact her dentist to be evaluated. Will f/u with Dr Eugenia Pancoast after she has her echo and cardiac CTA She will need interpretor for her cardiac CT She will take 50 mg of lopressor 2 hours before her study Interestingly her S2 is preserved and not clear if this is reflective of a valve with less than 3 leaflets    Signed: Charlton Haws 08/18/2019, 8:31 AM

## 2019-08-19 ENCOUNTER — Other Ambulatory Visit: Payer: Self-pay

## 2019-08-19 ENCOUNTER — Ambulatory Visit (INDEPENDENT_AMBULATORY_CARE_PROVIDER_SITE_OTHER): Payer: BC Managed Care – PPO | Admitting: Cardiovascular Disease

## 2019-08-19 ENCOUNTER — Encounter: Payer: Self-pay | Admitting: Cardiovascular Disease

## 2019-08-19 VITALS — BP 112/64 | HR 70 | Ht 62.0 in | Wt 127.0 lb

## 2019-08-19 DIAGNOSIS — R079 Chest pain, unspecified: Secondary | ICD-10-CM

## 2019-08-19 DIAGNOSIS — I350 Nonrheumatic aortic (valve) stenosis: Secondary | ICD-10-CM | POA: Diagnosis not present

## 2019-08-19 DIAGNOSIS — R0602 Shortness of breath: Secondary | ICD-10-CM

## 2019-08-19 LAB — BASIC METABOLIC PANEL
BUN/Creatinine Ratio: 14 (ref 9–23)
BUN: 8 mg/dL (ref 6–20)
CO2: 20 mmol/L (ref 20–29)
Calcium: 9 mg/dL (ref 8.7–10.2)
Chloride: 104 mmol/L (ref 96–106)
Creatinine, Ser: 0.59 mg/dL (ref 0.57–1.00)
GFR calc Af Amer: 142 mL/min/{1.73_m2} (ref >59)
GFR calc non Af Amer: 123 mL/min/{1.73_m2} (ref >59)
Glucose: 99 mg/dL (ref 65–99)
Potassium: 4.5 mmol/L (ref 3.5–5.2)
Sodium: 141 mmol/L (ref 134–144)

## 2019-08-19 LAB — HCG, SERUM, QUALITATIVE: hCG,Beta Subunit,Qual,Serum: NEGATIVE m[IU]/mL (ref <6)

## 2019-08-19 MED ORDER — METOPROLOL TARTRATE 50 MG PO TABS: ORAL_TABLET | ORAL | 0 refills | Status: DC

## 2019-08-19 NOTE — Patient Instructions (Addendum)
Medication Instructions:  Your physician has recommended you make the following change in your medication:  1-Take metoprolol 50 mg by mouth 2 hours prior to cardiac CT.   *If you need a refill on your cardiac medications before your next appointment, please call your pharmacy*  Lab Work: Your physician recommends that you have lab work today- BMET  If you have labs (blood work) drawn today and your tests are completely normal, you will receive your results only by: Marland Kitchen MyChart Message (if you have MyChart) OR . A paper copy in the mail If you have any lab test that is abnormal or we need to change your treatment, we will call you to review the results.   Testing/Procedures: Your physician has requested that you have an echocardiogram. Echocardiography is a painless test that uses sound waves to create images of your heart. It provides your doctor with information about the size and shape of your heart and how well your heart's chambers and valves are working. This procedure takes approximately one hour. There are no restrictions for this procedure.  Your physician has requested that you have cardiac CT. Cardiac computed tomography (CT) is a painless test that uses an x-ray machine to take clear, detailed pictures of your heart. For further information please visit [email or web address]. Please follow instruction sheet as given.  Follow-Up: At [facility], you and your health needs are our priority.  As part of our continuing mission to provide you with exceptional heart care, we have created designated Provider Care Teams.  These Care Teams include your primary Cardiologist (physician) and Advanced Practice Providers (APPs -  Physician Assistants and Nurse Practitioners) who all work together to provide you with the care you need, when you need it.  We recommend signing up for the patient portal called "MyChart".  Sign up information is provided on this After Visit Summary.  MyChart is used to  connect with patients for Virtual Visits (Telemedicine).  Patients are able to view lab/test results, encounter notes, upcoming appointments, etc.  Non-urgent messages can be sent to your provider as well.   To learn more about what you can do with MyChart, go to [email or web address].    Your next appointment:   2 month(s)  The format for your next appointment:   In Person  Provider:   You may see Dr. Johnsie Cancel or one of the following Advanced Practice Providers on your designated Care Team:    Truitt Merle, NP  Cecilie Kicks, NP  Kathyrn Drown, NP   You have been referred to Dr. Cyndia Bent.  Other Instructions  Your cardiac CT will be scheduled at one of the below locations:   [facility] [address] [place], [place] [address] [phone]  If scheduled at [facility], please arrive at the [facility] main entrance of [facility] 30 minutes prior to test start time. Proceed to the [facility] Radiology Department (first floor) to check-in and test prep.  Please follow these instructions carefully (unless otherwise directed):  On the Night Before the Test: . Be sure to Drink plenty of water. . Do not consume any caffeinated/decaffeinated beverages or chocolate 12 hours prior to your test. . Do not take any antihistamines 12 hours prior to your test.  On the Day of the Test: . Drink plenty of water. Do not drink any water within one hour of the test. . Do not eat any food 4 hours prior to the test. . You may take your  regular medications prior to the test.  . Take metoprolol (Lopressor) two hours prior to test. . FEMALES- please wear underwire-free bra if available  After the Test: . Drink plenty of water. . After receiving IV contrast, you may experience a mild flushed feeling. This is normal. . On occasion, you may experience a mild rash up to 24 hours after the test. This is not dangerous. If this occurs, you can take Benadryl 25 mg and  increase your fluid intake. . If you experience trouble breathing, this can be serious. If it is severe call 911 IMMEDIATELY. If it is mild, please call our office.  Once we have confirmed authorization from your insurance company, we will call you to set up a date and time for your test. Based on how quickly your insurance processes prior authorizations requests, please allow up to 4 weeks to be contacted for scheduling your Cardiac CT appointment. Be advised that routine Cardiac CT appointments could be scheduled as many as 8 weeks after your provider has ordered it.  For non-scheduling related questions, please contact the cardiac imaging nurse navigator should you have any questions/concerns: Marchia Bond, Cardiac Imaging Nurse Navigator Burley Saver, Interim Cardiac Imaging Nurse [place] [facility] Direct Office Dial: [phone]   For scheduling needs, including cancellations and rescheduling, please call Vivien Rota at [phone], option 3.

## 2019-09-02 ENCOUNTER — Telehealth (HOSPITAL_COMMUNITY): Payer: Self-pay | Admitting: *Deleted

## 2019-09-02 NOTE — Telephone Encounter (Signed)
Reaching out to patient to offer assistance regarding upcoming cardiac imaging study; pt verbalizes understanding of appt date/time, parking situation and where to check in, pre-test NPO status and medications ordered, and verified current allergies; name and call back number provided for further questions should they arise  [place] [facility] [phone] office [phone] cell

## 2019-09-03 ENCOUNTER — Ambulatory Visit (HOSPITAL_COMMUNITY)
Admission: RE | Admit: 2019-09-03 | Discharge: 2019-09-03 | Disposition: A | Discharge status: Home / Self Care | Payer: BC Managed Care – PPO | Source: Ambulatory Visit | Attending: Cardiovascular Disease | Admitting: Cardiovascular Disease

## 2019-09-03 ENCOUNTER — Other Ambulatory Visit: Payer: Self-pay

## 2019-09-03 DIAGNOSIS — R0602 Shortness of breath: Secondary | ICD-10-CM | POA: Insufficient documentation

## 2019-09-03 DIAGNOSIS — R079 Chest pain, unspecified: Secondary | ICD-10-CM | POA: Insufficient documentation

## 2019-09-03 DIAGNOSIS — I350 Nonrheumatic aortic (valve) stenosis: Secondary | ICD-10-CM

## 2019-09-03 MED ORDER — NITROGLYCERIN 0.4 MG SL SUBL
SUBLINGUAL_TABLET | SUBLINGUAL | Status: AC
  Filled 2019-09-03: qty 1

## 2019-09-03 MED ORDER — IOHEXOL 350 MG/ML SOLN
80.0000 mL | Freq: Once | INTRAVENOUS | Status: AC | PRN
  Administered 2019-09-03: 80 mL via INTRAVENOUS

## 2019-09-03 MED ORDER — NITROGLYCERIN 0.4 MG SL SUBL
0.4000 mg | SUBLINGUAL_TABLET | Freq: Once | SUBLINGUAL | Status: AC
  Administered 2019-09-03: 0.4 mg via SUBLINGUAL

## 2019-09-03 NOTE — Progress Notes (Signed)
Can completed. Tolerated well. D/C home ambulatory with husband. Awake and alert. In no distress

## 2019-09-09 ENCOUNTER — Ambulatory Visit (HOSPITAL_COMMUNITY): Payer: BC Managed Care – PPO | Attending: Cardiology

## 2019-09-09 ENCOUNTER — Other Ambulatory Visit: Payer: Self-pay

## 2019-09-09 DIAGNOSIS — R079 Chest pain, unspecified: Secondary | ICD-10-CM | POA: Insufficient documentation

## 2019-09-09 DIAGNOSIS — R0602 Shortness of breath: Secondary | ICD-10-CM | POA: Diagnosis not present

## 2019-09-09 DIAGNOSIS — I350 Nonrheumatic aortic (valve) stenosis: Secondary | ICD-10-CM | POA: Insufficient documentation

## 2019-09-09 LAB — ECHOCARDIOGRAM COMPLETE
AR max vel: 0.6 cm2
AV Area VTI: 0.7 cm2
AV Area mean vel: 0.66 cm2
AV Mean grad: 40 mmHg
AV Peak grad: 77.1 mmHg
Ao pk vel: 4.39 m/s
Area-P 1/2: 5.27 cm2
S' Lateral: 2.1 cm

## 2019-09-15 ENCOUNTER — Institutional Professional Consult (permissible substitution) (INDEPENDENT_AMBULATORY_CARE_PROVIDER_SITE_OTHER): Payer: BC Managed Care – PPO | Admitting: Thoracic Surgery (Cardiothoracic Vascular Surgery)

## 2019-09-15 ENCOUNTER — Ambulatory Visit (INDEPENDENT_AMBULATORY_CARE_PROVIDER_SITE_OTHER): Payer: BC Managed Care – PPO | Admitting: Pharmacist

## 2019-09-15 ENCOUNTER — Other Ambulatory Visit: Payer: Self-pay

## 2019-09-15 ENCOUNTER — Encounter: Payer: Self-pay | Admitting: Thoracic Surgery (Cardiothoracic Vascular Surgery)

## 2019-09-15 VITALS — BP 106/76 | HR 85 | Temp 97.7°F | Resp 20 | Ht 62.0 in | Wt 126.0 lb

## 2019-09-15 DIAGNOSIS — I350 Nonrheumatic aortic (valve) stenosis: Secondary | ICD-10-CM

## 2019-09-15 NOTE — Progress Notes (Addendum)
[address]       [place] [id number]             [phone]     CARDIOTHORACIC SURGERY CONSULTATION REPORT  Referring Provider is Wendall StadeNishan, Peter C, MD PCP is Patient, No Pcp Per  Chief Complaint  Patient presents with  . Aortic Stenosis    new patietn consultation ECHO 09/09/19, Cardiac CTA 09/03/19    HPI:  Patient is a 29 year old female originally from [place] who has been referred for surgical consultation to discuss treatment options for management of severe symptomatic aortic stenosis.  Patient emig[place] the [place].  She was noted to have a heart murmur on physical exam when she was pregnant and an echocardiogram at that time reportedly revealed severe aortic stenosis.  She completed her pregnancy without complication and underwent C-section with bilateral tubal ligation.  She was [facility] to [facility] for follow-up postpartum but subsequently lost to follow-up until recently.  She was evaluated in the emergency department for dizziness in March of this year and referred [facility].  She was seen in follow-up by Dr. Eden EmmsNishan and underwent transthoracic echocardiogram September 09, 2019.  Echocardiogram revealed normal left ventricular systolic function with severe aortic stenosis.  Peak velocity across aortic valve measured 4.4 m/s corresponding to mean transvalvular gradient estimated 40 mmHg and aortic valve area calculated 0.70 cm.  The DVI was reported 0.22.  Coronary CT angiography with gated CT angiogram of the heart was performed and notable for the absence of significant coronary artery disease.  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  Cardiothoracic surgical consultation was requested.  Patient is married and [place] locally in [place].  She speaks limited English and  is accompanied by her husband for office consultation visit today who assists with serving as an interpreter for some parts of our discussion.  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  By report this does not require strenuous physical exertion.  The patient has reportedly otherwise been healthy all of her adult life with the exception of her known heart murmur.  She denies any known history of rheumatic fever during childhood.  She admits to chronic symptoms of exertional shortness of breath and atypical left-sided chest discomfort which occur with moderate and more strenuous physical exertion.  She denies any symptoms of resting shortness of breath or chest pain.  She denies any history of PND, orthopnea, or lower extremity edema.  She has occasional dizzy spells but has not had any syncope.  Past Medical History:  Diagnosis Date  . Anemia   . Aortic stenosis, severe    echo 9/12: EF 55-65%, severe AS, mean gradient 49 mmHg, unicuspid Aortic Valve  . 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 Rheumatic heart disease 2011  . Tachycardia     Past Surgical History:  Procedure Laterality Date  . CESAREAN SECTION    . NO PAST SURGERIES      Family History  Problem Relation Age of Onset  . Stroke Neg Hx   . Cancer Neg Hx   . Asthma Other   . Anesthesia problems Neg Hx     Social History   Socioeconomic History  . Marital status: Married    Spouse name: Not on file  .  Number of children: Not on file  . Years of education: Not on file  . Highest education level: Not on file  Occupational History  . Not on file  Tobacco Use  . Smoking status: Never Smoker  . Smokeless tobacco: Never Used  Vaping Use  . Vaping Use: Never used  Substance and Sexual Activity  . Alcohol use: Never  . Drug use: Never  . Sexual activity: Yes  Other Topics Concern  . Not on file  Social History Narrative   ** Merged History Encounter **       Social Determinants of Health   Financial  Resource Strain:   . Difficulty of Paying Living Expenses:   Food Insecurity:   . Worried About Programme researcher, broadcasting/film/video in the Last Year:   . Barista in the Last Year:   Transportation Needs:   . Freight forwarder (Medical):   Marland Kitchen Lack of Transportation (Non-Medical):   Physical Activity:   . Days of Exercise per Week:   . Minutes of Exercise per Session:   Stress:   . Feeling of Stress :   Social Connections:   . Frequency of Communication with Friends and Family:   . Frequency of Social Gatherings with Friends and Family:   . Attends Religious Services:   . Active Member of Clubs or Organizations:   . Attends Banker Meetings:   Marland Kitchen Marital Status:   Intimate Partner Violence:   . Fear of Current or Ex-Partner:   . Emotionally Abused:   Marland Kitchen Physically Abused:   . Sexually Abused:     Current Outpatient Medications  Medication Sig Dispense Refill  . acetaminophen (TYLENOL) 500 MG tablet Take 500 mg by mouth every 6 (six) hours as needed for mild pain.     No current facility-administered medications for this visit.    No Known Allergies    Review of Systems:   General:  normal appetite, normal energy, no weight gain, no weight loss, no fever  Cardiac:  + chest pain with exertion, no chest pain at rest, +SOB with exertion, no resting SOB, no PND, no orthopnea, no palpitations, no arrhythmia, no atrial fibrillation, no LE edema, + dizzy spells, no syncope  Respiratory:  no shortness of breath, no home oxygen, no productive cough, no dry cough, no bronchitis, no wheezing, no hemoptysis, no asthma, no pain with inspiration or cough, no sleep apnea, no CPAP at night  GI:   no difficulty swallowing, no reflux, no frequent heartburn, no hiatal hernia, no abdominal pain, no constipation, no diarrhea, no hematochezia, no hematemesis, no melena  GU:   no dysuria,  no frequency, no urinary tract infection, no hematuria, no kidney stones, no kidney  disease  Vascular:  no pain suggestive of claudication, no pain in feet, no leg cramps, no varicose veins, no DVT, no non-healing foot ulcer  Neuro:   no stroke, no TIA's, no seizures, no headaches, no temporary blindness one eye,  no slurred speech, no peripheral neuropathy, no chronic pain, no instability of gait, no memory/cognitive dysfunction  Musculoskeletal: no arthritis, no joint swelling, no myalgias, no difficulty walking, normal mobility   Skin:   no rash, no itching, no skin infections, no pressure sores or ulcerations  Psych:   no anxiety, no depression, no nervousness, no unusual recent stress  Eyes:   no blurry vision, no floaters, no recent vision changes, does not wear glasses or contacts  ENT:   no hearing  loss, no loose or painful teeth, no dentures, last saw dentist approximately 1 year ago  Hematologic:  no easy bruising, no abnormal bleeding, no clotting disorder, no frequent epistaxis  Endocrine:  no diabetes, does not check CBG's at home     Physical Exam:   BP 106/76 (BP Location: Left Arm, Patient Position: Sitting, Cuff Size: Normal)   Pulse 85   Temp 97.7 F (36.5 C)   Resp 20   Ht 5\' 2"  (1.575 m)   Wt 126 lb (57.2 kg)   SpO2 99% Comment: RA  BMI 23.05 kg/m   General:    well-appearing  HEENT:  Unremarkable   Neck:   no JVD, no bruits, no adenopathy   Chest:   clear to auscultation, symmetrical breath sounds, no wheezes, no rhonchi   CV:   RRR, grade III/VI systolic murmur   Abdomen:  soft, non-tender, no masses   Extremities:  warm, well-perfused, pulses diminished but palpable, no LE edema  Rectal/GU  Deferred  Neuro:   Grossly non-focal and symmetrical throughout  Skin:   Clean and dry, no rashes, no breakdown   Diagnostic Tests:   ECHOCARDIOGRAM REPORT       Patient Name:  JANIFER GIESELMAN [facility] Date of Exam: 09/09/2019  Medical Rec #: 09/11/2019      Height:    62.0 in  Accession #:  [id number]      Weight:    127.0 lb   Date of Birth: 05-24-1990      BSA:     1.576 m  Patient Age:  29 years       BP:      112/64 mmHg  Patient Gender: F          HR:      71 bpm.  Exam Location: [address]   Procedure: 2D Echo, 3D Echo, Cardiac Doppler, Color Doppler and Strain  Analysis   Indications:  I35 Aortic stenosis.    History:    Patient has prior history of Echocardiogram examinations,  most         recent 10/27/2010. Aortic Valve Disease and Unicuspid AV;         Arrythmias:Tachycardia. H/o rheumatic heart disease.    Sonographer:  10/29/2010, RDCS  Referring Phys: [id number] Garald Braver   IMPRESSIONS    1. Normal LV function; bicuspid aortic valve with severe AS (peak  velocity 4.4 m/s, mean gradient 40 mmHg).  2. Left ventricular ejection fraction, by estimation, is 60 to 65%. The  left ventricle has normal function. The left ventricle has no regional  wall motion abnormalities. Left ventricular diastolic parameters were  normal.  3. Right ventricular systolic function is normal. The right ventricular  size is normal. There is normal pulmonary artery systolic pressure.  4. The mitral valve is normal in structure. Trivial mitral valve  regurgitation. No evidence of mitral stenosis.  5. The aortic valve is bicuspid. Aortic valve regurgitation is not  visualized. Severe aortic valve stenosis.  6. Aortic dilatation noted. There is mild dilatation of the ascending  aorta measuring 38 mm.  7. The inferior vena cava is normal in size with greater than 50%  respiratory variability, suggesting right atrial pressure of 3 mmHg.   Comparison(s): 10/27/10 EF 55-65%. Severe AS.   FINDINGS  Left Ventricle: Left ventricular ejection fraction, by estimation, is 60  to 65%. The left ventricle has normal function. The left ventricle has no  regional wall motion abnormalities. The left  ventricular internal cavity  size was normal in  size. There is  no left ventricular hypertrophy. Left ventricular diastolic parameters  were normal.   Right Ventricle: The right ventricular size is normal.Right ventricular  systolic function is normal. There is normal pulmonary artery systolic  pressure. The tricuspid regurgitant velocity is 2.37 m/s, and with an  assumed right atrial pressure of 3 mmHg,  the estimated right ventricular systolic pressure is 25.5 mmHg.   Left Atrium: Left atrial size was normal in size.   Right Atrium: Right atrial size was normal in size.   Pericardium: There is no evidence of pericardial effusion.   Mitral Valve: The mitral valve is normal in structure. Normal mobility of  the mitral valve leaflets. Trivial mitral valve regurgitation. No evidence  of mitral valve stenosis.   Tricuspid Valve: The tricuspid valve is normal in structure. Tricuspid  valve regurgitation is mild . No evidence of tricuspid stenosis.   Aortic Valve: The aortic valve is bicuspid. Aortic valve regurgitation is  not visualized. Severe aortic stenosis is present. Aortic valve mean  gradient measures 40.0 mmHg. Aortic valve peak gradient measures 77.1  mmHg. Aortic valve area, by VTI measures  0.70 cm.   Pulmonic Valve: The pulmonic valve was normal in structure. Pulmonic valve  regurgitation is trivial. No evidence of pulmonic stenosis.   Aorta: Aortic dilatation noted. There is mild dilatation of the ascending  aorta measuring 38 mm.   Venous: The inferior vena cava is normal in size with greater than 50%  respiratory variability, suggesting right atrial pressure of 3 mmHg.   IAS/Shunts: No atrial level shunt detected by color flow Doppler.   Additional Comments: Normal LV function; bicuspid aortic valve with severe  AS (peak velocity 4.4 m/s, mean gradient 40 mmHg).     LEFT VENTRICLE  PLAX 2D  LVIDd:     3.50 cm Diastology  LVIDs:     2.10 cm LV e' lateral:  12.80 cm/s  LV PW:     0.90 cm  LV E/e' lateral: 8.4  LV IVS:    0.80 cm LV e' medial:  8.05 cm/s  LVOT diam:   2.00 cm LV E/e' medial: 13.3  LV SV:     66  LV SV Index:  42    2D Longitudinal Strain  LVOT Area:   3.14 cm 2D Strain GLS (A2C):  -25.9 %             2D Strain GLS (A3C):  -20.3 %             2D Strain GLS (A4C):  -25.4 %             2D Strain GLS Avg:   -23.9 %               3D Volume EF:             3D EF:    69 %             LV EDV:    75 ml             LV ESV:    23 ml             LV SV:    52 ml   RIGHT VENTRICLE  RV Basal diam: 3.00 cm  RV S prime:   11.60 cm/s  TAPSE (M-mode): 2.8 cm  RVSP:      25.5 mmHg   LEFT ATRIUM  Index    RIGHT ATRIUM      Index  LA diam:    3.20 cm 2.03 cm/m RA Pressure: 3.00 mmHg  LA Vol (A2C):  19.0 ml 12.05 ml/m RA Area:   8.39 cm  LA Vol (A4C):  28.8 ml 18.27 ml/m RA Volume:  13.80 ml 8.76 ml/m  LA Biplane Vol: 23.6 ml 14.97 ml/m  AORTIC VALVE  AV Area (Vmax):  0.60 cm  AV Area (Vmean):  0.66 cm  AV Area (VTI):   0.70 cm  AV Vmax:      439.00 cm/s  AV Vmean:     294.000 cm/s  AV VTI:      0.937 m  AV Peak Grad:   77.1 mmHg  AV Mean Grad:   40.0 mmHg  LVOT Vmax:     84.20 cm/s  LVOT Vmean:    61.500 cm/s  LVOT VTI:     0.209 m  LVOT/AV VTI ratio: 0.22    AORTA  Ao Root diam: 2.50 cm  Ao Asc diam: 3.80 cm   MITRAL VALVE        TRICUSPID VALVE               TR Peak grad:  22.5 mmHg               TR Vmax:    237.00 cm/s  MV E velocity: 107.00 cm/s Estimated RAP: 3.00 mmHg  MV A velocity: 61.30 cm/s  RVSP:      25.5 mmHg  MV E/A ratio: 1.75               SHUNTS               Systemic VTI: 0.21 m               Systemic Diam:  2.00 cm   Olga Millers MD  Electronically signed by Olga Millers MD  Signature Date/Time: 09/09/2019/12:25:14 PM       Cardiac/Coronary CTA  TECHNIQUE: The patient was scanned on a [employer]. A 100 kV prospective scan was triggered in the descending thoracic aorta at 111 HU's. Axial non-contrast 3 mm slices were carried out through the heart. The data set was analyzed on a dedicated work station and scored using the Agatson method. Gantry rotation speed was 250 msecs and collimation was .6 mm. No beta blockade and 0.4 mg of sl NTG was given. The 3D data set was reconstructed in 5% intervals of the 35-75 % of the R-R cycle. Diastolic phases were analyzed on a dedicated work station using MPR, MIP and VRT modes. The patient received 80 cc of contrast.  FINDINGS: Image quality: excellent.  Noise artifact is: Limited.  Coronary Arteries:  Normal coronary origin.  Right dominance.  Left main: The left main is a large caliber vessel with a normal take off from the left coronary cusp that trifurcates into a LAD, LCX, and ramus intermedius. There is no plaque or stenosis.  Left anterior descending artery: The LAD is patent without evidence of plaque or stenosis. The LAD gives off 2 patent diagonal branches.  Ramus intermedius: Patent with no evidence of plaque or stenosis.  Left circumflex artery: The LCX is non-dominant and patent with no evidence of plaque or stenosis. The LCX gives off 2 patent obtuse marginal branches.  Right coronary artery: The RCA is dominant with normal take off from the right coronary cusp. There is no evidence of plaque or stenosis. The RCA  terminates as a PDA and right posterolateral branch without evidence of plaque or stenosis.  Right Atrium: Right atrial size is within normal limits.  Right Ventricle: The right ventricular cavity is within normal limits.  Left Atrium: Left atrial size is normal in size with no  left atrial appendage filling defect.  Left Ventricle: The ventricular cavity size is within normal limits. There are no stigmata of prior infarction. There is no abnormal filling defect.  Pulmonary arteries: Normal in size without proximal filling defect.  Pulmonary veins: Normal pulmonary venous drainage.  Pericardium: Normal thickness with no significant effusion or calcium present.  Cardiac valves: The aortic valve is severely thickened and appears to possibly be a bicuspid valve without raphe (Sievers type 0). Unable to exclude unicuspid valve. No significant aortic valve calcification. The mitral valve is normal structure without significant calcification.  Aorta: The aortic root measures up to 33 mm in longest dimension. The ascending aorta is ectatic up to 38 mm.  Extra-cardiac findings: See attached radiology report for non-cardiac structures.  IMPRESSION: 1. Coronary calcium score of 0.  2. Normal coronary origin with right dominance.  3. Normal coronary arteries.  4. The aortic valve appears to be bicuspid without raphe (Sievers type 0), but is possibly unicuspid.  5. 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  She describes stable symptoms of exertional shortness of breath and chest discomfort consistent with chronic diastolic congestive heart failure, New York Heart Association functional class II.  She also has occasional dizzy spells without any history of syncope.    I have personally reviewed the patient's recent transthoracic echocardiogram and cardiac gated CT angiogram of the heart.  Patient has what appears to be unusual valve morphology, possibly unicuspid aortic valve.  From a functional standpoint  transthoracic echocardiogram demonstrates moderate thickening with moderately restricted leaflet mobility causing severe aortic stenosis.  Peak velocity across aortic valve measured 4.4 m/s corresponding to mean transvalvular gradient estimated 40 mmHg.  Left ventricular systolic function remains normal.  There does not appear to be significant aortic insufficiency.  The overall size of the aortic root appears relatively small and it is very asymmetrical.  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  No other significant abnormalities are noted and coronary anatomy appears normal.  Aortic valve replacement may require root enlargement or root replacement in order to avoid patient-prosthesis mismatch.  There is no question the patient would best be treated with aortic valve replacement.  Given the patient's relatively young age and presumably normal life expectancy, options include either valve replacement using a mechanical prosthetic valve or Ross pulmonary autograft procedure.   Plan:  The patient and her husband were counseled at length regarding treatment alternatives for management of severe aortic stenosis including continued medical therapy versus proceeding with aortic valve replacement in the near future.  The natural history of aortic stenosis was reviewed, as was long term prognosis with medical therapy alone.  Surgical options were discussed at length including conventional surgical aortic valve replacement through either a full median sternotomy or using minimally invasive techniques.  Other alternatives including the Ross autograft procedure were discussed.  Discussion was held comparing the relative  risks of mechanical valve replacement with need for lifelong anticoagulation versus use of a bioprosthetic tissue valve or pulmonary autograft and the associated potential for late structural valve deterioration and failure.      The patient and her husband desire to discuss options further before making a final decision.  As the next step the patient will meet with one of the pharmacists at the [facility] anticoagulation clinic to learn more about what it would mean to remain on lifelong anticoagulation using warfarin.  They will return to our office next week to discuss options further and possibly schedule elective surgical intervention.  All questions have been answered.  I spent in excess of 90 minutes during the conduct of this office consultation and >50% of this time involved direct face-to-face encounter with the patient for counseling and/or coordination of their care.    Salvatore Decent. Cornelius Moras, MD 09/15/2019 9:51 AM

## 2019-09-15 NOTE — Patient Instructions (Addendum)
Continue all previous medications without any changes at this time  Meet with pharmacist at the Coumadin anticoagulation clinic at [facility] to discuss what it would be like to take Coumadin (warfarin) for the rest of your life if we choose to replace your failing heart valve using a mechanical valve.  Alternatively we could replace your valve using some type of a tissue valve, perhaps even using your own pulmonic valve using the Ross procedure.  This would avoid the need for Coumadin or any other blood thinner medications but be associated with the possibility that you may need more heart surgery in the distant future if your valve fails

## 2019-09-15 NOTE — Progress Notes (Signed)
Patient referred by Dr. Cornelius Moras to coumadin clinic for anticoagulation consult.  Nepali phone interpreter # [phone] used.  Patient has appointment with Dr Cornelius Moras on 8/9 to decide on next surgical steps.  If patient proceeds with mechanical valve, will need lifetime anticoagulation.  A full discussion of the nature of a warfarin  has been carried out.  A benefit risk analysis has been presented to the patient, so that they understand the justification for choosing anticoagulation at this time. The need for frequent and regular monitoring, precise dosage adjustment and compliance is stressed.  Side effects of potential bleeding are discussed.    Introduced patient to Coumadin nurse in clinic who.  Patient voiced understanding and had no further questions.

## 2019-09-22 ENCOUNTER — Other Ambulatory Visit: Payer: Self-pay

## 2019-09-22 ENCOUNTER — Ambulatory Visit (INDEPENDENT_AMBULATORY_CARE_PROVIDER_SITE_OTHER): Payer: BC Managed Care – PPO | Admitting: Thoracic Surgery (Cardiothoracic Vascular Surgery)

## 2019-09-22 ENCOUNTER — Encounter: Payer: Self-pay | Admitting: Thoracic Surgery (Cardiothoracic Vascular Surgery)

## 2019-09-22 VITALS — BP 106/71 | HR 77 | Temp 98.1°F | Resp 16 | Ht 62.0 in | Wt 126.0 lb

## 2019-09-22 DIAGNOSIS — I350 Nonrheumatic aortic (valve) stenosis: Secondary | ICD-10-CM

## 2019-09-22 NOTE — Progress Notes (Signed)
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  She was originally seen in consultation on September 15, 2019 at which time she described stable symptoms of exertional shortness of breath and chest discomfort consistent with chronic diastolic congestive heart failure, New York Heart Association functional class II.  She has also had some mild dizzy spells without any history of syncope.  We discussed treatment options at length at that time including aortic valve replacement using a mechanical prosthetic valve versus the Ross pulmonary autograft procedure.  We discussed issues related to both including the need for lifelong anticoagulation using warfarin following mechanical aortic valve replacement.  Following her visit the patient and her husband met with Rollen Sox in the anticoagulation clinic at [facility] to discuss further what it would be like for the patient to remain on long-term anticoagulation.  The patient and her husband return to the office today for further consultation with an official interpreter present to assist with discussions.  The patient reports no new problems or complaints over the past week.   Current Outpatient Medications  Medication Sig Dispense Refill  . acetaminophen (TYLENOL) 500 MG tablet Take 500 mg by mouth every 6 (six) hours as needed for mild pain.     No current facility-administered medications for this visit.      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  She describes stable symptoms of exertional shortness of breath and chest discomfort consistent with chronic diastolic congestive heart failure, New York Heart Association functional class II.  She also has occasional dizzy spells without any history of syncope.    I have personally reviewed the patient's recent transthoracic echocardiogram and cardiac gated CT angiogram of the heart.  Patient has what appears to be unusual valve morphology, possibly unicuspid aortic valve.  From a functional standpoint transthoracic echocardiogram demonstrates moderate thickening with moderately restricted leaflet mobility causing severe aortic stenosis.  Peak velocity across aortic valve measured 4.4 m/s corresponding to mean transvalvular gradient estimated 40 mmHg.  Left ventricular systolic function remains normal.  There does not appear to be significant aortic insufficiency.  The overall size of the aortic root appears relatively small and it is very asymmetrical.  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  No other significant abnormalities are noted and coronary anatomy appears normal.  Aortic valve  replacement would likely require root enlargement or root replacement in order to avoid patient-prosthesis mismatch.  There is no question the patient would best be treated with aortic valve replacement.  Given the patient's relatively young age and presumably normal life expectancy, options include either valve  replacement using a mechanical prosthetic valve or Ross pulmonary autograft procedure.      Plan:  The patient and her husband were again counseled at length with use of an interpreter regarding treatment alternatives for management of severe aortic stenosis including continued medical therapy versus proceeding with aortic valve replacement in the near future.  The natural history of aortic stenosis was reviewed, as was long term prognosis with medical therapy alone.  Surgical options were discussed at length including conventional surgical aortic valve using a mechanical prosthetic valve with aortic root enlargement or root replacement versus the Ross autograft procedure.  Discussion was held comparing the relative risks of mechanical valve replacement with need for lifelong anticoagulation versus use of a bioprosthetic tissue valve or pulmonary autograft and the associated potential for late structural valve deterioration and failure.    The patient specifically request that we proceed with aortic valve replacement using a mechanical prosthetic valve in the near future.  We tentatively plan to proceed with surgery on October 15, 2019.    The patient understands and accepts all potential associated risks of surgery including but not limited to risk of death, stroke, myocardial infarction, congestive heart failure, respiratory failure, renal failure, pneumonia, bleeding requiring blood transfusion and or reexploration, arrhythmia, heart block or bradycardia requiring permanent pacemaker, aortic dissection or other major vascular complication, pleural effusions or other delayed complications related to continued congestive heart failure, and other late complications related to valve replacement including structural valve deterioration and failure, thrombosis, endocarditis, or paravalvular leak.  Expectations for her postoperative convalescence and return to work have been discussed.    I spent in excess  of 30 minutes during the conduct of this office consultation and >50% of this time involved direct face-to-face encounter with the patient for counseling and/or coordination of their care.    Valentina Gu. Roxy Manns, MD 09/22/2019 1:00 PM

## 2019-09-22 NOTE — Patient Instructions (Addendum)
You are scheduled to undergo open heart surgery for aortic valve replacement with possible aortic root enlargement or root replacement on October 15, 2019  You can anticipate that during your recovery you will likely need to remain out of work for up to 3 months from the time of surgery  Continue taking all current medications without change through the day before surgery.  Make sure to bring all of your medications with you when you come for your Pre-Admission Testing appointment at [facility] Short-Stay Department.  Have nothing to eat or drink after midnight the night before surgery.  On the morning of surgery do not take any medications.  At your appointment for Pre-Admission Testing at the [facility] Short-Stay Department you will be asked to sign permission forms for your upcoming surgery.  By definition your signature on these forms implies that you and/or your designee provide full informed consent for your planned surgical procedure(s), that alternative treatment options have been discussed, that you understand and accept any and all potential risks, and that you have some understanding of what to expect for your post-operative convalescence.  For any major cardiac surgical procedure potential operative risks include but are not limited to at least some risk of death, stroke or other neurologic complication, myocardial infarction, congestive heart failure, respiratory failure, renal failure, bleeding requiring blood transfusion and/or reexploration, irregular heart rhythm, heart block or bradycardia requiring permanent pacemaker, pneumonia, pericardial effusion, pleural effusion, wound infection, pulmonary embolus or other thromboembolic complication, chronic pain, or other complications related to the specific procedure(s) performed.  Please call to schedule a follow-up appointment in our office prior to surgery if you have any unresolved questions about  your planned surgical procedure, the associated risks, alternative treatment options, and/or expectations for your post-operative recovery.

## 2019-09-23 NOTE — Progress Notes (Signed)
This encounter was created in error - please disregard.

## 2019-09-24 ENCOUNTER — Other Ambulatory Visit: Payer: Self-pay

## 2019-09-24 DIAGNOSIS — I350 Nonrheumatic aortic (valve) stenosis: Secondary | ICD-10-CM

## 2019-10-11 ENCOUNTER — Emergency Department (HOSPITAL_COMMUNITY)
Admission: EM | Admit: 2019-10-11 | Discharge: 2019-10-11 | Discharge status: Absent without leave | Payer: BC Managed Care – PPO

## 2019-10-11 ENCOUNTER — Inpatient Hospital Stay (HOSPITAL_COMMUNITY): Admission: RE | Admit: 2019-10-11 | Payer: BC Managed Care – PPO | Source: Ambulatory Visit

## 2019-10-13 ENCOUNTER — Other Ambulatory Visit: Payer: Self-pay

## 2019-10-13 ENCOUNTER — Encounter (HOSPITAL_COMMUNITY)
Admission: RE | Admit: 2019-10-13 | Discharge: 2019-10-13 | Disposition: A | Discharge status: Home / Self Care | Payer: BC Managed Care – PPO | Source: Ambulatory Visit | Attending: Thoracic Surgery (Cardiothoracic Vascular Surgery) | Admitting: Thoracic Surgery (Cardiothoracic Vascular Surgery)

## 2019-10-13 ENCOUNTER — Ambulatory Visit (HOSPITAL_COMMUNITY)
Admission: RE | Admit: 2019-10-13 | Discharge: 2019-10-13 | Disposition: A | Discharge status: Home / Self Care | Payer: BC Managed Care – PPO | Source: Ambulatory Visit | Attending: Thoracic Surgery (Cardiothoracic Vascular Surgery) | Admitting: Thoracic Surgery (Cardiothoracic Vascular Surgery)

## 2019-10-13 ENCOUNTER — Ambulatory Visit (HOSPITAL_BASED_OUTPATIENT_CLINIC_OR_DEPARTMENT_OTHER)
Admission: RE | Admit: 2019-10-13 | Discharge: 2019-10-13 | Disposition: A | Discharge status: Home / Self Care | Payer: BC Managed Care – PPO | Source: Ambulatory Visit | Attending: Thoracic Surgery (Cardiothoracic Vascular Surgery) | Admitting: Thoracic Surgery (Cardiothoracic Vascular Surgery)

## 2019-10-13 ENCOUNTER — Other Ambulatory Visit (HOSPITAL_COMMUNITY)
Admission: RE | Admit: 2019-10-13 | Discharge: 2019-10-13 | Disposition: A | Discharge status: Home / Self Care | Payer: BC Managed Care – PPO | Source: Ambulatory Visit | Attending: Thoracic Surgery (Cardiothoracic Vascular Surgery) | Admitting: Thoracic Surgery (Cardiothoracic Vascular Surgery)

## 2019-10-13 ENCOUNTER — Encounter (HOSPITAL_COMMUNITY): Payer: Self-pay

## 2019-10-13 DIAGNOSIS — D62 Acute posthemorrhagic anemia: Secondary | ICD-10-CM | POA: Diagnosis not present

## 2019-10-13 DIAGNOSIS — I509 Heart failure, unspecified: Secondary | ICD-10-CM | POA: Diagnosis not present

## 2019-10-13 DIAGNOSIS — Z954 Presence of other heart-valve replacement: Secondary | ICD-10-CM | POA: Diagnosis not present

## 2019-10-13 DIAGNOSIS — E8770 Fluid overload, unspecified: Secondary | ICD-10-CM | POA: Diagnosis not present

## 2019-10-13 DIAGNOSIS — I350 Nonrheumatic aortic (valve) stenosis: Secondary | ICD-10-CM

## 2019-10-13 DIAGNOSIS — I358 Other nonrheumatic aortic valve disorders: Secondary | ICD-10-CM | POA: Diagnosis not present

## 2019-10-13 DIAGNOSIS — Z01818 Encounter for other preprocedural examination: Secondary | ICD-10-CM | POA: Insufficient documentation

## 2019-10-13 DIAGNOSIS — J9811 Atelectasis: Secondary | ICD-10-CM | POA: Diagnosis not present

## 2019-10-13 DIAGNOSIS — R000 Tachycardia, unspecified: Secondary | ICD-10-CM | POA: Diagnosis not present

## 2019-10-13 DIAGNOSIS — D649 Anemia, unspecified: Secondary | ICD-10-CM | POA: Diagnosis not present

## 2019-10-13 DIAGNOSIS — I517 Cardiomegaly: Secondary | ICD-10-CM | POA: Diagnosis not present

## 2019-10-13 DIAGNOSIS — Z20822 Contact with and (suspected) exposure to covid-19: Secondary | ICD-10-CM | POA: Diagnosis not present

## 2019-10-13 DIAGNOSIS — J90 Pleural effusion, not elsewhere classified: Secondary | ICD-10-CM | POA: Diagnosis not present

## 2019-10-13 DIAGNOSIS — I209 Angina pectoris, unspecified: Secondary | ICD-10-CM | POA: Diagnosis not present

## 2019-10-13 LAB — COMPREHENSIVE METABOLIC PANEL
ALT: 17 U/L (ref 0–44)
AST: 21 U/L (ref 15–41)
Albumin: 4.1 g/dL (ref 3.5–5.0)
Alkaline Phosphatase: 83 U/L (ref 38–126)
Anion gap: 13 (ref 5–15)
BUN: 8 mg/dL (ref 6–20)
CO2: 20 mmol/L — ABNORMAL LOW (ref 22–32)
Calcium: 9.1 mg/dL (ref 8.9–10.3)
Chloride: 103 mmol/L (ref 98–111)
Creatinine, Ser: 0.49 mg/dL (ref 0.44–1.00)
GFR calc Af Amer: >60 mL/min (ref >60)
GFR calc non Af Amer: >60 mL/min (ref >60)
Glucose, Bld: 102 mg/dL — ABNORMAL HIGH (ref 70–99)
Potassium: 4.1 mmol/L (ref 3.5–5.1)
Sodium: 136 mmol/L (ref 135–145)
Total Bilirubin: 0.4 mg/dL (ref 0.3–1.2)
Total Protein: 7.5 g/dL (ref 6.5–8.1)

## 2019-10-13 LAB — BLOOD GAS, ARTERIAL
Acid-base deficit: 0.6 mmol/L (ref 0.0–2.0)
Bicarbonate: 23.5 mmol/L (ref 20.0–28.0)
FIO2: 21
O2 Saturation: 96.9 %
Patient temperature: 37
pCO2 arterial: 38.8 mmHg (ref 32.0–48.0)
pH, Arterial: 7.4 (ref 7.350–7.450)
pO2, Arterial: 87.6 mmHg (ref 83.0–108.0)

## 2019-10-13 LAB — URINALYSIS, ROUTINE W REFLEX MICROSCOPIC
Bilirubin Urine: NEGATIVE
Glucose, UA: NEGATIVE mg/dL
Hgb urine dipstick: NEGATIVE
Ketones, ur: NEGATIVE mg/dL
Nitrite: NEGATIVE
Protein, ur: NEGATIVE mg/dL
Specific Gravity, Urine: 1.015 (ref 1.005–1.030)
pH: 6 (ref 5.0–8.0)

## 2019-10-13 LAB — SURGICAL PCR SCREEN
MRSA, PCR: NEGATIVE
Staphylococcus aureus: NEGATIVE

## 2019-10-13 LAB — APTT: aPTT: 29 seconds (ref 24–36)

## 2019-10-13 LAB — CBC
HCT: 37.8 % (ref 36.0–46.0)
Hemoglobin: 12.5 g/dL (ref 12.0–15.0)
MCH: 24.7 pg — ABNORMAL LOW (ref 26.0–34.0)
MCHC: 33.1 g/dL (ref 30.0–36.0)
MCV: 74.6 fL — ABNORMAL LOW (ref 80.0–100.0)
Platelets: 372 10*3/uL (ref 150–400)
RBC: 5.07 MIL/uL (ref 3.87–5.11)
RDW: 12.8 % (ref 11.5–15.5)
WBC: 14.8 10*3/uL — ABNORMAL HIGH (ref 4.0–10.5)
nRBC: 0 % (ref 0.0–0.2)

## 2019-10-13 LAB — HEMOGLOBIN A1C
Hgb A1c MFr Bld: 5.6 % (ref 4.8–5.6)
Mean Plasma Glucose: 114.02 mg/dL

## 2019-10-13 LAB — SARS CORONAVIRUS 2 (TAT 6-24 HRS): SARS Coronavirus 2: NEGATIVE

## 2019-10-13 LAB — PROTIME-INR
INR: 1 (ref 0.8–1.2)
Prothrombin Time: 12.6 seconds (ref 11.4–15.2)

## 2019-10-13 NOTE — Progress Notes (Signed)
PAT visit completed, Nepali language interpreter Joslyn Hy [phone] used via iPad interpreter services.

## 2019-10-13 NOTE — Progress Notes (Signed)
[facility] [address] [place] [address] Phone: [phone] Fax: [phone]  [id number] [place], [place] - [address] AT [facility] [address] [facility] [address] [place] [address] Phone: [phone] Fax: [phone]      Your procedure is scheduled on Wednesday, October 15, 2019.  Report to [facility] Main Entrance "A" at 6:30 A.M., and check in at the Admitting office.  Call this number if you have problems the morning of surgery:  [phone]  Call [phone] if you have any questions prior to your surgery date Monday-Friday 8am-4pm    Remember:  Do not eat or drink after midnight the night before your surgery     Take these medicines the morning of surgery with A SIP OF WATER:   NONE  As of today, STOP taking any Aspirin (unless otherwise instructed by your surgeon) Aleve, Naproxen, Ibuprofen, Motrin, Advil, Goody's, BC's, all herbal medications, fish oil, and all vitamins.                      Do not wear jewelry, make up, or nail polish            Do not wear lotions, powders, perfumes, or deodorant.            Do not shave 48 hours prior to surgery.            Do not bring valuables to the hospital.            [facility] is not responsible for any belongings or valuables.  Do NOT Smoke (Tobacco/Vaping) or drink Alcohol 24 hours prior to your procedure If you use a CPAP at night, you may bring all equipment for your overnight stay.   Contacts, glasses, dentures or bridgework may not be worn into surgery.      For patients admitted to the hospital, discharge time will be determined by your treatment team.   Patients discharged the day of surgery will not be allowed to drive home, and someone needs to stay with them for 24 hours.    Special instructions:   [place]- Preparing For Surgery  Before surgery, you can play an important role. Because skin is not sterile, your  skin needs to be as free of germs as possible. You can reduce the number of germs on your skin by washing with CHG (chlorahexidine gluconate) Soap before surgery.  CHG is an antiseptic cleaner which kills germs and bonds with the skin to continue killing germs even after washing.    Oral Hygiene is also important to reduce your risk of infection.  Remember - BRUSH YOUR TEETH THE MORNING OF SURGERY WITH YOUR REGULAR TOOTHPASTE  Please do not use if you have an allergy to CHG or antibacterial soaps. If your skin becomes reddened/irritated stop using the CHG.  Do not shave (including legs and underarms) for at least 48 hours prior to first CHG shower. It is OK to shave your face.  Please follow these instructions carefully.   1. Shower the NIGHT BEFORE SURGERY and the MORNING OF SURGERY with CHG Soap.   2. If you chose to wash your hair, wash your hair first as usual with your normal shampoo.  3. After you shampoo, rinse your hair and body thoroughly to remove the shampoo.  4. Use CHG as you would any other liquid soap. You can apply CHG directly to the skin  and wash gently with a scrungie or a clean washcloth.   5. Apply the CHG Soap to your body ONLY FROM THE NECK DOWN.  Do not use on open wounds or open sores. Avoid contact with your eyes, ears, mouth and genitals (private parts). Wash Face and genitals (private parts)  with your normal soap.   6. Wash thoroughly, paying special attention to the area where your surgery will be performed.  7. Thoroughly rinse your body with warm water from the neck down.  8. DO NOT shower/wash with your normal soap after using and rinsing off the CHG Soap.  9. Pat yourself dry with a CLEAN TOWEL.  10. Wear CLEAN PAJAMAS to bed the night before surgery  11. Place CLEAN SHEETS on your bed the night of your first shower and DO NOT SLEEP WITH PETS.   Day of Surgery: Wear Clean/Comfortable clothing the morning of surgery Do not apply any  deodorants/lotions.   Remember to brush your teeth WITH YOUR REGULAR TOOTHPASTE.   Please read over the following fact sheets that you were given.

## 2019-10-13 NOTE — Progress Notes (Signed)
VASCULAR LAB    Pre CABG Dopplers completed.    Preliminary report:  See CV proc for preliminary results.  Cordell Coke, RVT 10/13/2019, 10:43 AM

## 2019-10-13 NOTE — Progress Notes (Signed)
PCP -  denies Cardiologist - Dr. Charlton Haws   PPM/ICD - denies  Chest x-ray - 10/13/2019 EKG - 10/13/2019 Stress Test - denies ECHO - 09/09/2019 Cardiac Cath - denies  Sleep Study - denies CPAP - N/A  DM: denies  Blood Thinner Instructions: N/A Aspirin Instructions: N/A  ERAS Protcol - No  COVID TEST- 10/13/2019, test results pending  Anesthesia review: YES, cardiac hx  Patient denies shortness of breath, fever, cough and chest pain at PAT appointment  All instructions explained to the patient, with a verbal understanding of the material. Patient agrees to go over the instructions while at home for a better understanding. Patient also instructed to self quarantine after being tested for COVID-19. The opportunity to ask questions was provided.

## 2019-10-14 ENCOUNTER — Ambulatory Visit (HOSPITAL_COMMUNITY)
Admission: RE | Admit: 2019-10-14 | Discharge: 2019-10-14 | Disposition: A | Discharge status: Home / Self Care | Payer: BC Managed Care – PPO | Source: Ambulatory Visit | Attending: Thoracic Surgery (Cardiothoracic Vascular Surgery) | Admitting: Thoracic Surgery (Cardiothoracic Vascular Surgery)

## 2019-10-14 DIAGNOSIS — I350 Nonrheumatic aortic (valve) stenosis: Secondary | ICD-10-CM

## 2019-10-14 LAB — PULMONARY FUNCTION TEST
DL/VA % pred: 110 %
DL/VA: 5.02 ml/min/mmHg/L
DLCO cor % pred: 66 %
DLCO cor: 14.31 ml/min/mmHg
DLCO unc % pred: 64 %
DLCO unc: 13.9 ml/min/mmHg
FEF 25-75 Pre: 2.42 L/sec
FEF2575-%Pred-Pre: 74 %
FEV1-%Pred-Pre: 72 %
FEV1-Pre: 2.02 L
FEV1FVC-%Pred-Pre: 100 %
FEV6-Pre: 2.33 L
FVC-%Pred-Pre: 71 %
FVC-Pre: 2.33 L
Pre FEV1/FVC ratio: 87 %
Pre FEV6/FVC Ratio: 100 %
RV % pred: 133 %
RV: 1.72 L
TLC % pred: 83 %
TLC: 3.98 L

## 2019-10-14 MED ORDER — NITROGLYCERIN IN D5W 200-5 MCG/ML-% IV SOLN
2.0000 ug/min | INTRAVENOUS | Status: DC
  Filled 2019-10-14: qty 250

## 2019-10-14 MED ORDER — EPINEPHRINE HCL 5 MG/250ML IV SOLN IN NS
0.0000 ug/min | INTRAVENOUS | Status: DC
  Filled 2019-10-14: qty 250

## 2019-10-14 MED ORDER — TRANEXAMIC ACID (OHS) PUMP PRIME SOLUTION
2.0000 mg/kg | INTRAVENOUS | Status: DC
  Filled 2019-10-14: qty 1.15

## 2019-10-14 MED ORDER — DEXMEDETOMIDINE HCL IN NACL 400 MCG/100ML IV SOLN
0.1000 ug/kg/h | INTRAVENOUS | Status: AC
  Administered 2019-10-15: .3 ug/kg/h via INTRAVENOUS
  Filled 2019-10-14: qty 100

## 2019-10-14 MED ORDER — NOREPINEPHRINE 4 MG/250ML-% IV SOLN
0.0000 ug/min | INTRAVENOUS | Status: DC
  Filled 2019-10-14: qty 250

## 2019-10-14 MED ORDER — INSULIN REGULAR(HUMAN) IN NACL 100-0.9 UT/100ML-% IV SOLN
INTRAVENOUS | Status: AC
  Administered 2019-10-15: .9 [IU]/h via INTRAVENOUS
  Filled 2019-10-14: qty 100

## 2019-10-14 MED ORDER — SODIUM CHLORIDE 0.9 % IV SOLN
INTRAVENOUS | Status: DC
  Filled 2019-10-14: qty 30

## 2019-10-14 MED ORDER — VANCOMYCIN HCL 1250 MG/250ML IV SOLN
1250.0000 mg | INTRAVENOUS | Status: AC
  Administered 2019-10-15: 1250 mg via INTRAVENOUS
  Filled 2019-10-14: qty 250

## 2019-10-14 MED ORDER — SODIUM CHLORIDE 0.9 % IV SOLN
1.5000 g | INTRAVENOUS | Status: AC
  Administered 2019-10-15: 1.5 g via INTRAVENOUS
  Filled 2019-10-14 (×2): qty 1.5

## 2019-10-14 MED ORDER — PLASMA-LYTE 148 IV SOLN
INTRAVENOUS | Status: DC
  Filled 2019-10-14: qty 2.5

## 2019-10-14 MED ORDER — TRANEXAMIC ACID 1000 MG/10ML IV SOLN
1.5000 mg/kg/h | INTRAVENOUS | Status: AC
  Administered 2019-10-15: 1.5 mg/kg/h via INTRAVENOUS
  Filled 2019-10-14: qty 25

## 2019-10-14 MED ORDER — MILRINONE LACTATE IN DEXTROSE 20-5 MG/100ML-% IV SOLN
0.3000 ug/kg/min | INTRAVENOUS | Status: DC
  Filled 2019-10-14: qty 100

## 2019-10-14 MED ORDER — TRANEXAMIC ACID (OHS) BOLUS VIA INFUSION
15.0000 mg/kg | INTRAVENOUS | Status: AC
  Administered 2019-10-15: 865.5 mg via INTRAVENOUS
  Filled 2019-10-14: qty 866

## 2019-10-14 MED ORDER — POTASSIUM CHLORIDE 2 MEQ/ML IV SOLN
80.0000 meq | INTRAVENOUS | Status: DC
  Filled 2019-10-14: qty 40

## 2019-10-14 MED ORDER — SODIUM CHLORIDE 0.9 % IV SOLN
750.0000 mg | INTRAVENOUS | Status: AC
  Administered 2019-10-15: 750 mg via INTRAVENOUS
  Filled 2019-10-14: qty 750

## 2019-10-14 MED ORDER — MANNITOL 20 % IV SOLN
INTRAVENOUS | Status: DC
  Filled 2019-10-14: qty 13

## 2019-10-14 MED ORDER — VANCOMYCIN HCL 1000 MG IV SOLR
INTRAVENOUS | Status: DC
  Filled 2019-10-14: qty 1000

## 2019-10-14 MED ORDER — PHENYLEPHRINE HCL-NACL 20-0.9 MG/250ML-% IV SOLN
30.0000 ug/min | INTRAVENOUS | Status: DC
  Filled 2019-10-14: qty 250

## 2019-10-14 NOTE — Anesthesia Preprocedure Evaluation (Addendum)
Anesthesia Evaluation  Patient identified by MRN, date of birth, ID band Patient awake    Reviewed: Allergy & Precautions, H&P , NPO status , Patient's Chart, lab work & pertinent test results  History of Anesthesia Complications Negative for: history of anesthetic complications  Airway Mallampati: II  TM Distance: >3 FB Neck ROM: Full    Dental no notable dental hx. (+) Dental Advisory Given, Teeth Intact   Pulmonary neg pulmonary ROS,    Pulmonary exam normal breath sounds clear to auscultation       Cardiovascular + angina (patient having chest pain now) + Valvular Problems/Murmurs (h/o rheumatic heart disease, severe aortic stenosis, plan had been for delivery at [facility] in CCU, but patient arrived to MAU in labor at 5 cm) AS  Rhythm:Regular Rate:Normal + Systolic murmurs ECHO 727/2021 1. Normal LV function; bicuspid aortic valve with severe AS (peak velocity 4.4 m/s, mean gradient 40 mmHg).  2. Left ventricular ejection fraction, by estimation, is 60 to 65%. The left ventricle has normal function. The left ventricle has no regional wall motion abnormalities. Left ventricular diastolic parameters were normal.  3. Right ventricular systolic function is normal. The right ventricular size is normal. There is normal pulmonary artery systolic pressure.  4. The mitral valve is normal in structure. Trivial mitral valve regurgitation. No evidence of mitral stenosis.  5. The aortic valve is bicuspid. Aortic valve regurgitation is not visualized. Severe aortic valve stenosis.  6. Aortic dilatation noted. There is mild dilatation of the ascending aorta measuring 38 mm.  7. The inferior vena cava is normal in size with greater than 50% respiratory variability, suggesting right atrial pressure of 3 mmHg.    Neuro/Psych negative neurological ROS  negative psych ROS   GI/Hepatic negative GI ROS, Neg liver ROS,   Endo/Other  negative  endocrine ROS  Renal/GU negative Renal ROS  negative genitourinary   Musculoskeletal   Abdominal   Peds  Hematology  (+) Blood dyscrasia, anemia ,   Anesthesia Other Findings Patient is Nepali - speaks no [place].  Patient informed of planned procedures via interpreter.  Reproductive/Obstetrics                           Anesthesia Physical  Anesthesia Plan  ASA: IV  Anesthesia Plan: General   Post-op Pain Management:    Induction: Intravenous  PONV Risk Score and Plan: Ondansetron, Treatment may vary due to age or medical condition and Midazolam  Airway Management Planned: Oral ETT  Additional Equipment: Arterial line, CVP, PA Cath, 3D TEE, TEE and Ultrasound Guidance Line Placement  Intra-op Plan: Utilization Of Total Body Hypothermia per surgeon request  Post-operative Plan: Post-operative intubation/ventilation  Informed Consent: I have reviewed the patients History and Physical, chart, labs and discussed the procedure including the risks, benefits and alternatives for the proposed anesthesia with the patient or authorized representative who has indicated his/her understanding and acceptance.     Dental advisory given  Plan Discussed with: CRNA  Anesthesia Plan Comments:       Anesthesia Quick Evaluation

## 2019-10-14 NOTE — H&P (Signed)
[address]       [place] [address]             [phone]          CARDIOTHORACIC SURGERY HISTORY AND PHYSICAL EXAM  Referring Provider is Josue Hector, MD PCP is Patient, No Pcp Per  Chief Complaint  Patient presents with  . Aortic Stenosis    new patietn consultation ECHO 09/09/19, Cardiac CTA 09/03/19   HPI:  Patient is a 29 year old female originally from [place] who has been referred for surgical consultation to discuss treatment options for management of severe symptomatic aortic stenosis.  Patient emigrated to the [place] in 2012.  She was noted to have a heart murmur on physical exam when she was pregnant and an echocardiogram at that time reportedly revealed severe aortic stenosis.  She completed her pregnancy without complication and underwent C-section with bilateral tubal ligation.  She was referred to [facility] medical cardiology clinic for follow-up postpartum but subsequently lost to follow-up until recently.  She was evaluated in the emergency department for dizziness in March of this year and referred to the [facility].  She was seen in follow-up by Dr. Johnsie Cancel and underwent transthoracic echocardiogram September 09, 2019.  Echocardiogram revealed normal left ventricular systolic function with severe aortic stenosis.  Peak velocity across aortic valve measured 4.4 m/s corresponding to mean transvalvular gradient estimated 40 mmHg and aortic valve area calculated 0.70 cm.  The DVI was reported 0.22.  Coronary CT angiography with gated CT angiogram of the heart was performed and notable for the absence of significant coronary artery disease.  Functional anatomy of the aortic valve was felt to be consistent with [place] type 0 bicuspid aortic valve versus possible unicuspid valve.  Cardiothoracic surgical consultation was requested.  Patient is married and lives locally in [place] with her husband.  She speaks limited  English and is accompanied by her husband for office consultation visit today who assists with serving as an interpreter for some parts of our discussion.  Patient works in a [employer].  By report this does not require strenuous physical exertion.  The patient has reportedly otherwise been healthy all of her adult life with the exception of her known heart murmur.  She denies any known history of rheumatic fever during childhood.  She admits to chronic symptoms of exertional shortness of breath and atypical left-sided chest discomfort which occur with moderate and more strenuous physical exertion.  She denies any symptoms of resting shortness of breath or chest pain.  She denies any history of PND, orthopnea, or lower extremity edema.  She has occasional dizzy spells but has not had any syncope.  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  She was originally seen in consultation on September 15, 2019 at which time she described stable symptoms of exertional shortness of breath and chest discomfort consistent with chronic diastolic congestive heart failure, New York Heart Association functional class II.  She has also had some mild dizzy spells without any history of syncope.  We discussed treatment options at length at that time including aortic valve replacement using a mechanical prosthetic valve versus the Ross pulmonary autograft procedure.  We discussed issues related to both including the need for lifelong anticoagulation using warfarin following mechanical aortic valve replacement.  Following her visit the patient and her husband met with Rollen Sox in the anticoagulation clinic at [facility] to discuss further what it would be like for the patient to remain on long-term anticoagulation.  The patient and her husband return to  the office today for further consultation with an official interpreter present to assist with discussions.  The patient reports no new problems or complaints over the past week.   Past Medical History:  Diagnosis Date  . Anemia   . Aortic stenosis, severe    echo 9/12: EF 55-65%, severe AS, mean gradient 49 mmHg, unicuspid Aortic Valve  . 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 Rheumatic heart disease 2011  . Tachycardia     Past Surgical History:  Procedure Laterality Date  . CESAREAN SECTION    . EYE SURGERY     per patient, had a mass above her eye and it was removed maybe in  2017/2018  . NO PAST SURGERIES      Family History  Problem Relation Age of Onset  . Stroke Neg Hx   . Cancer Neg Hx   . Asthma Other   . Anesthesia problems Neg Hx     Social History Social History   Tobacco Use  . Smoking status: Never Smoker  . Smokeless tobacco: Never Used  Vaping Use  . Vaping Use: Never used  Substance Use Topics  . Alcohol use: Never  . Drug use: Never    Prior to Admission medications   Medication Sig Start Date End Date Taking? Authorizing Provider  acetaminophen (TYLENOL) 500 MG tablet Take 500 mg by mouth every 6 (six) hours as needed for mild pain. Patient not taking: Reported on 10/06/2019    [provider]    No Known Allergies     Review of Systems:              General:                      normal appetite, normal energy, no weight gain, no weight loss, no fever             Cardiac:                       + chest pain with exertion, no chest pain at rest, +SOB with exertion, no resting SOB, no PND, no orthopnea, no palpitations, no arrhythmia, no atrial fibrillation, no LE edema, + dizzy spells, no syncope             Respiratory:                 no shortness of breath, no home oxygen, no productive cough, no dry cough, no bronchitis, no wheezing, no hemoptysis, no asthma, no pain with inspiration or cough, no sleep apnea, no CPAP  at night             GI:                               no difficulty swallowing, no reflux, no frequent heartburn, no hiatal hernia, no abdominal pain, no constipation, no diarrhea, no hematochezia, no hematemesis, no melena             GU:  no dysuria,  no frequency, no urinary tract infection, no hematuria, no kidney stones, no kidney disease             Vascular:                     no pain suggestive of claudication, no pain in feet, no leg cramps, no varicose veins, no DVT, no non-healing foot ulcer             Neuro:                         no stroke, no TIA's, no seizures, no headaches, no temporary blindness one eye,  no slurred speech, no peripheral neuropathy, no chronic pain, no instability of gait, no memory/cognitive dysfunction             Musculoskeletal:         no arthritis, no joint swelling, no myalgias, no difficulty walking, normal mobility              Skin:                            no rash, no itching, no skin infections, no pressure sores or ulcerations             Psych:                         no anxiety, no depression, no nervousness, no unusual recent stress             Eyes:                           no blurry vision, no floaters, no recent vision changes, does not wear glasses or contacts             ENT:                            no hearing loss, no loose or painful teeth, no dentures, last saw dentist approximately 1 year ago             Hematologic:               no easy bruising, no abnormal bleeding, no clotting disorder, no frequent epistaxis             Endocrine:                   no diabetes, does not check CBG's at home                           Physical Exam:              BP 106/76 (BP Location: Left Arm, Patient Position: Sitting, Cuff Size: Normal)   Pulse 85   Temp 97.7 F (36.5 C)   Resp 20   Ht _0  (1.575 m)   Wt 126 lb (57.2 kg)   SpO2 99% Comment: RA  BMI 23.05 kg/m              General:                         well-appearing  HEENT:                       Unremarkable              Neck:                           no JVD, no bruits, no adenopathy              Chest:                          clear to auscultation, symmetrical breath sounds, no wheezes, no rhonchi              CV:                              RRR, grade III/VI systolic murmur              Abdomen:                    soft, non-tender, no masses              Extremities:                 warm, well-perfused, pulses diminished but palpable, no LE edema             Rectal/GU                   Deferred             Neuro:                         Grossly non-focal and symmetrical throughout             Skin:                            Clean and dry, no rashes, no breakdown   Diagnostic Tests:   ECHOCARDIOGRAM REPORT       Patient Name:  CHERRIE FRANCA [facility] Date of Exam: 09/09/2019  Medical Rec #: [id number]      Height:    62.0 in  Accession #:  [id number]      Weight:    127.0 lb  Date of Birth: [birth year]      BSA:     1.576 m  Patient Age:  29 years       BP:      112/64 mmHg  Patient Gender: F          HR:      71 bpm.  Exam Location: [address]   Procedure: 2D Echo, 3D Echo, Cardiac Doppler, Color Doppler and Strain  Analysis   Indications:  I35 Aortic stenosis.    History:    Patient has prior history of Echocardiogram examinations,  most         recent 10/27/2010. Aortic Valve Disease and Unicuspid AV;         Arrythmias:Tachycardia. H/o rheumatic heart disease.    Sonographer:  Jessee Avers, RDCS  Referring Phys: [place]    1. Normal LV function; bicuspid aortic valve with severe AS (peak  velocity 4.4 m/s, mean gradient 40 mmHg).  2. Left ventricular ejection fraction, by estimation,  is 60 to 65%. The  left ventricle has normal function. The left ventricle has no regional  wall  motion abnormalities. Left ventricular diastolic parameters were  normal.  3. Right ventricular systolic function is normal. The right ventricular  size is normal. There is normal pulmonary artery systolic pressure.  4. The mitral valve is normal in structure. Trivial mitral valve  regurgitation. No evidence of mitral stenosis.  5. The aortic valve is bicuspid. Aortic valve regurgitation is not  visualized. Severe aortic valve stenosis.  6. Aortic dilatation noted. There is mild dilatation of the ascending  aorta measuring 38 mm.  7. The inferior vena cava is normal in size with greater than 50%  respiratory variability, suggesting right atrial pressure of 3 mmHg.   Comparison(s): 10/27/10 EF 55-65%. Severe AS.   FINDINGS  Left Ventricle: Left ventricular ejection fraction, by estimation, is 60  to 65%. The left ventricle has normal function. The left ventricle has no  regional wall motion abnormalities. The left ventricular internal cavity  size was normal in size. There is  no left ventricular hypertrophy. Left ventricular diastolic parameters  were normal.   Right Ventricle: The right ventricular size is normal.Right ventricular  systolic function is normal. There is normal pulmonary artery systolic  pressure. The tricuspid regurgitant velocity is 2.37 m/s, and with an  assumed right atrial pressure of 3 mmHg,  the estimated right ventricular systolic pressure is 90.3 mmHg.   Left Atrium: Left atrial size was normal in size.   Right Atrium: Right atrial size was normal in size.   Pericardium: There is no evidence of pericardial effusion.   Mitral Valve: The mitral valve is normal in structure. Normal mobility of  the mitral valve leaflets. Trivial mitral valve regurgitation. No evidence  of mitral valve stenosis.   Tricuspid Valve: The tricuspid valve is normal in structure. Tricuspid  valve regurgitation is mild . No evidence of tricuspid stenosis.   Aortic Valve:  The aortic valve is bicuspid. Aortic valve regurgitation is  not visualized. Severe aortic stenosis is present. Aortic valve mean  gradient measures 40.0 mmHg. Aortic valve peak gradient measures 77.1  mmHg. Aortic valve area, by VTI measures  0.70 cm.   Pulmonic Valve: The pulmonic valve was normal in structure. Pulmonic valve  regurgitation is trivial. No evidence of pulmonic stenosis.   Aorta: Aortic dilatation noted. There is mild dilatation of the ascending  aorta measuring 38 mm.   Venous: The inferior vena cava is normal in size with greater than 50%  respiratory variability, suggesting right atrial pressure of 3 mmHg.   IAS/Shunts: No atrial level shunt detected by color flow Doppler.   Additional Comments: Normal LV function; bicuspid aortic valve with severe  AS (peak velocity 4.4 m/s, mean gradient 40 mmHg).     LEFT VENTRICLE  PLAX 2D  LVIDd:     3.50 cm Diastology  LVIDs:     2.10 cm LV e' lateral:  12.80 cm/s  LV PW:     0.90 cm LV E/e' lateral: 8.4  LV IVS:    0.80 cm LV e' medial:  8.05 cm/s  LVOT diam:   2.00 cm LV E/e' medial: 13.3  LV SV:     66  LV SV Index:  42    2D Longitudinal Strain  LVOT Area:   3.14 cm 2D Strain GLS (A2C):  -25.9 %             2D Strain GLS (A3C):  -20.3 %  2D Strain GLS (A4C):  -25.4 %             2D Strain GLS Avg:   -23.9 %               3D Volume EF:             3D EF:    69 %             LV EDV:    75 ml             LV ESV:    23 ml             LV SV:    52 ml   RIGHT VENTRICLE  RV Basal diam: 3.00 cm  RV S prime:   11.60 cm/s  TAPSE (M-mode): 2.8 cm  RVSP:      25.5 mmHg   LEFT ATRIUM       Index    RIGHT ATRIUM      Index  LA diam:    3.20 cm 2.03 cm/m RA Pressure: 3.00 mmHg  LA Vol (A2C):  19.0 ml 12.05 ml/m RA  Area:   8.39 cm  LA Vol (A4C):  28.8 ml 18.27 ml/m RA Volume:  13.80 ml 8.76 ml/m  LA Biplane Vol: 23.6 ml 14.97 ml/m  AORTIC VALVE  AV Area (Vmax):  0.60 cm  AV Area (Vmean):  0.66 cm  AV Area (VTI):   0.70 cm  AV Vmax:      439.00 cm/s  AV Vmean:     294.000 cm/s  AV VTI:      0.937 m  AV Peak Grad:   77.1 mmHg  AV Mean Grad:   40.0 mmHg  LVOT Vmax:     84.20 cm/s  LVOT Vmean:    61.500 cm/s  LVOT VTI:     0.209 m  LVOT/AV VTI ratio: 0.22    AORTA  Ao Root diam: 2.50 cm  Ao Asc diam: 3.80 cm   MITRAL VALVE        TRICUSPID VALVE               TR Peak grad:  22.5 mmHg               TR Vmax:    237.00 cm/s  MV E velocity: 107.00 cm/s Estimated RAP: 3.00 mmHg  MV A velocity: 61.30 cm/s  RVSP:      25.5 mmHg  MV E/A ratio: 1.75               SHUNTS               Systemic VTI: 0.21 m               Systemic Diam: 2.00 cm   Kirk Ruths MD  Electronically signed by Kirk Ruths MD  Signature Date/Time: 09/09/2019/12:25:14 PM       Cardiac/Coronary CTA  TECHNIQUE: The patient was scanned on a [employer]. A 100 kV prospective scan was triggered in the descending thoracic aorta at 111 HU's. Axial non-contrast 3 mm slices were carried out through the heart. The data set was analyzed on a dedicated work station and scored using the [place]. Gantry rotation speed was 250 msecs and collimation was .6 mm. No beta blockade and 0.4 mg of sl NTG was given. The 3D data set was reconstructed in 5% intervals of the 35-75 % of the R-R cycle. Diastolic phases were  analyzed on a dedicated work station using MPR, MIP and VRT modes. The patient received 80 cc of contrast.  FINDINGS: Image quality: excellent.  Noise artifact is: Limited.  Coronary Arteries: Normal coronary origin. Right  dominance.  Left main: The left main is a large caliber vessel with a normal take off from the left coronary cusp that trifurcates into a LAD, LCX, and ramus intermedius. There is no plaque or stenosis.  Left anterior descending artery: The LAD is patent without evidence of plaque or stenosis. The LAD gives off 2 patent diagonal branches.  Ramus intermedius: Patent with no evidence of plaque or stenosis.  Left circumflex artery: The LCX is non-dominant and patent with no evidence of plaque or stenosis. The LCX gives off 2 patent obtuse marginal branches.  Right coronary artery: The RCA is dominant with normal take off from the right coronary cusp. There is no evidence of plaque or stenosis. The RCA terminates as a PDA and right posterolateral branch without evidence of plaque or stenosis.  Right Atrium: Right atrial size is within normal limits.  Right Ventricle: The right ventricular cavity is within normal limits.  Left Atrium: Left atrial size is normal in size with no left atrial appendage filling defect.  Left Ventricle: The ventricular cavity size is within normal limits. There are no stigmata of prior infarction. There is no abnormal filling defect.  Pulmonary arteries: Normal in size without proximal filling defect.  Pulmonary veins: Normal pulmonary venous drainage.  Pericardium: Normal thickness with no significant effusion or calcium present.  Cardiac valves: The aortic valve is severely thickened and appears to possibly be a bicuspid valve without raphe ([place] type 0). Unable to exclude unicuspid valve. No significant aortic valve calcification. The mitral valve is normal structure without significant calcification.  Aorta: The aortic root measures up to 33 mm in longest dimension. The ascending aorta is ectatic up to 38 mm.  Extra-cardiac findings: See attached radiology report for non-cardiac structures.  IMPRESSION: 1. Coronary  calcium score of 0.  2. Normal coronary origin with right dominance.  3. Normal coronary arteries.  4. The aortic valve appears to be bicuspid without raphe ([place] type 0), but is possibly unicuspid.  5. 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 She describes stable symptoms of exertional shortness of breath and chest discomfort consistent with chronic diastolic congestive heart failure, New York Heart Association functional class II. She also has occasional dizzy spells without any history of syncope.   I have personally reviewed the patient's recent transthoracic echocardiogram and cardiac gated CT angiogram of the heart. Patient has what appears to be unusual valve morphology, possibly unicuspid aortic valve. From a functional standpoint transthoracic echocardiogram demonstratesmoderate thickening with moderately restricted leaflet mobility causing severe aortic stenosis. Peak velocity across aortic valve measured 4.4 m/s corresponding to mean transvalvular gradient estimated 40 mmHg. Left ventricular systolic function remains normal. There does not appear to be significant aortic insufficiency. The overall size of the aortic root appearsrelatively small and it is very asymmetrical. 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 No other significant abnormalities are noted and coronary anatomy appears normal.Aortic valve replacement would likely require root enlargement or root replacement in order to avoid patient-prosthesis  mismatch.  There is no question the patient would best be treated with aortic valve replacement. Given the patient's relatively young age  and presumably normal life expectancy, options include either valve replacement using a mechanical prosthetic valve or Ross pulmonary autograft procedure.      Plan:  The patientand her husband were againcounseled at length with use of an interpreter regarding treatment alternatives for management of severe aortic stenosis including continued medical therapy versus proceeding with aortic valve replacement in the near future. The natural history of aortic stenosis was reviewed, as was long term prognosis with medical therapy alone. Surgical options were discussed at length including conventional surgical aortic valve using a mechanical prosthetic valve with aortic root enlargement or root replacement versus the Ross autograft procedure. Discussion was held comparing the relative risks of mechanical valve replacement with need for lifelong anticoagulation versus use of a bioprosthetic tissue valve or pulmonary autograftand the associated potential for late structural valve deterioration and failure.   The patient specifically request that we proceed with aortic valve replacement using a mechanical prosthetic valve in the near future.  We tentatively plan to proceed with surgery on October 15, 2019.    The patient understands and accepts all potential associated risks of surgery including but not limited to risk of death, stroke, myocardial infarction, congestive heart failure, respiratory failure, renal failure, pneumonia, bleeding requiring blood transfusion and or reexploration, arrhythmia, heart block or bradycardia requiring permanent pacemaker, aortic dissection or other major vascular complication, pleural effusions or other delayed complications related to continued congestive heart failure, and other late complications related to valve replacement including structural valve deterioration and failure, thrombosis, endocarditis, or paravalvular leak.  Expectations for her postoperative  convalescence and return to work have been discussed.      Valentina Gu. Roxy Manns, MD 09/22/2019 1:00 PM

## 2019-10-15 ENCOUNTER — Encounter (HOSPITAL_COMMUNITY): Payer: Self-pay | Admitting: Thoracic Surgery (Cardiothoracic Vascular Surgery)

## 2019-10-15 ENCOUNTER — Inpatient Hospital Stay (HOSPITAL_COMMUNITY): Payer: BC Managed Care – PPO | Admitting: Physician Assistant

## 2019-10-15 ENCOUNTER — Inpatient Hospital Stay (HOSPITAL_COMMUNITY)
Admission: RE | Admit: 2019-10-15 | Discharge: 2019-10-19 | DRG: 220 | Disposition: A | Discharge status: Home / Self Care | Payer: BC Managed Care – PPO | Attending: Thoracic Surgery (Cardiothoracic Vascular Surgery) | Admitting: Thoracic Surgery (Cardiothoracic Vascular Surgery)

## 2019-10-15 ENCOUNTER — Encounter (HOSPITAL_COMMUNITY)
Admission: RE | Disposition: A | Discharge status: Home / Self Care | Payer: Self-pay | Source: Home / Self Care | Attending: Thoracic Surgery (Cardiothoracic Vascular Surgery)

## 2019-10-15 ENCOUNTER — Inpatient Hospital Stay (HOSPITAL_COMMUNITY): Payer: BC Managed Care – PPO

## 2019-10-15 ENCOUNTER — Other Ambulatory Visit: Payer: Self-pay

## 2019-10-15 ENCOUNTER — Inpatient Hospital Stay (HOSPITAL_COMMUNITY): Payer: BC Managed Care – PPO | Admitting: Certified Registered Nurse Anesthetist

## 2019-10-15 DIAGNOSIS — E8770 Fluid overload, unspecified: Secondary | ICD-10-CM | POA: Diagnosis not present

## 2019-10-15 DIAGNOSIS — J9811 Atelectasis: Secondary | ICD-10-CM

## 2019-10-15 DIAGNOSIS — Z8679 Personal history of other diseases of the circulatory system: Secondary | ICD-10-CM

## 2019-10-15 DIAGNOSIS — Z09 Encounter for follow-up examination after completed treatment for conditions other than malignant neoplasm: Secondary | ICD-10-CM

## 2019-10-15 DIAGNOSIS — I350 Nonrheumatic aortic (valve) stenosis: Principal | ICD-10-CM

## 2019-10-15 DIAGNOSIS — Z952 Presence of prosthetic heart valve: Secondary | ICD-10-CM

## 2019-10-15 DIAGNOSIS — Z954 Presence of other heart-valve replacement: Secondary | ICD-10-CM

## 2019-10-15 DIAGNOSIS — Z603 Acculturation difficulty: Secondary | ICD-10-CM

## 2019-10-15 DIAGNOSIS — Z20822 Contact with and (suspected) exposure to covid-19: Secondary | ICD-10-CM | POA: Diagnosis present

## 2019-10-15 DIAGNOSIS — D62 Acute posthemorrhagic anemia: Secondary | ICD-10-CM | POA: Diagnosis not present

## 2019-10-15 HISTORY — PX: AORTIC VALVE REPLACEMENT: SHX41

## 2019-10-15 HISTORY — DX: Presence of other heart-valve replacement: Z95.4

## 2019-10-15 HISTORY — PX: ASCENDING AORTIC ROOT REPLACEMENT: SHX5729

## 2019-10-15 HISTORY — PX: TEE WITHOUT CARDIOVERSION: SHX5443

## 2019-10-15 LAB — POCT I-STAT 7, (LYTES, BLD GAS, ICA,H+H)
Acid-base deficit: 2 mmol/L (ref 0.0–2.0)
Acid-base deficit: 2 mmol/L (ref 0.0–2.0)
Acid-base deficit: 3 mmol/L — ABNORMAL HIGH (ref 0.0–2.0)
Acid-base deficit: 6 mmol/L — ABNORMAL HIGH (ref 0.0–2.0)
Acid-base deficit: 5 mmol/L — ABNORMAL HIGH (ref 0.0–2.0)
Bicarbonate: 23.3 mmol/L (ref 20.0–28.0)
Bicarbonate: 23.2 mmol/L (ref 20.0–28.0)
Bicarbonate: 21.9 mmol/L (ref 20.0–28.0)
Bicarbonate: 20.8 mmol/L (ref 20.0–28.0)
Bicarbonate: 20.5 mmol/L (ref 20.0–28.0)
Calcium, Ion: 0.87 mmol/L — CL (ref 1.15–1.40)
Calcium, Ion: 0.89 mmol/L — CL (ref 1.15–1.40)
Calcium, Ion: 0.83 mmol/L — CL (ref 1.15–1.40)
Calcium, Ion: 1.15 mmol/L (ref 1.15–1.40)
Calcium, Ion: 1.05 mmol/L — ABNORMAL LOW (ref 1.15–1.40)
HCT: 25 % — ABNORMAL LOW (ref 36.0–46.0)
HCT: 26 % — ABNORMAL LOW (ref 36.0–46.0)
HCT: 25 % — ABNORMAL LOW (ref 36.0–46.0)
HCT: 33 % — ABNORMAL LOW (ref 36.0–46.0)
HCT: 28 % — ABNORMAL LOW (ref 36.0–46.0)
Hemoglobin: 8.5 g/dL — ABNORMAL LOW (ref 12.0–15.0)
Hemoglobin: 8.8 g/dL — ABNORMAL LOW (ref 12.0–15.0)
Hemoglobin: 8.5 g/dL — ABNORMAL LOW (ref 12.0–15.0)
Hemoglobin: 11.2 g/dL — ABNORMAL LOW (ref 12.0–15.0)
Hemoglobin: 9.5 g/dL — ABNORMAL LOW (ref 12.0–15.0)
O2 Saturation: 100 %
O2 Saturation: 100 %
O2 Saturation: 100 %
O2 Saturation: 99 %
O2 Saturation: 99 %
Patient temperature: 36.6
Patient temperature: 37.9
Potassium: 3.6 mmol/L (ref 3.5–5.1)
Potassium: 5.6 mmol/L — ABNORMAL HIGH (ref 3.5–5.1)
Potassium: 5.8 mmol/L — ABNORMAL HIGH (ref 3.5–5.1)
Potassium: 4.1 mmol/L (ref 3.5–5.1)
Potassium: 3.8 mmol/L (ref 3.5–5.1)
Sodium: 139 mmol/L (ref 135–145)
Sodium: 137 mmol/L (ref 135–145)
Sodium: 138 mmol/L (ref 135–145)
Sodium: 143 mmol/L (ref 135–145)
Sodium: 142 mmol/L (ref 135–145)
TCO2: 24 mmol/L (ref 22–32)
TCO2: 24 mmol/L (ref 22–32)
TCO2: 23 mmol/L (ref 22–32)
TCO2: 22 mmol/L (ref 22–32)
TCO2: 22 mmol/L (ref 22–32)
pCO2 arterial: 38.9 mmHg (ref 32.0–48.0)
pCO2 arterial: 37.9 mmHg (ref 32.0–48.0)
pCO2 arterial: 35.6 mmHg (ref 32.0–48.0)
pCO2 arterial: 46 mmHg (ref 32.0–48.0)
pCO2 arterial: 42.3 mmHg (ref 32.0–48.0)
pH, Arterial: 7.385 (ref 7.350–7.450)
pH, Arterial: 7.394 (ref 7.350–7.450)
pH, Arterial: 7.397 (ref 7.350–7.450)
pH, Arterial: 7.262 — ABNORMAL LOW (ref 7.350–7.450)
pH, Arterial: 7.298 — ABNORMAL LOW (ref 7.350–7.450)
pO2, Arterial: 359 mmHg — ABNORMAL HIGH (ref 83.0–108.0)
pO2, Arterial: 315 mmHg — ABNORMAL HIGH (ref 83.0–108.0)
pO2, Arterial: 273 mmHg — ABNORMAL HIGH (ref 83.0–108.0)
pO2, Arterial: 153 mmHg — ABNORMAL HIGH (ref 83.0–108.0)
pO2, Arterial: 166 mmHg — ABNORMAL HIGH (ref 83.0–108.0)

## 2019-10-15 LAB — POCT I-STAT, CHEM 8
BUN: 5 mg/dL — ABNORMAL LOW (ref 6–20)
BUN: 5 mg/dL — ABNORMAL LOW (ref 6–20)
BUN: 5 mg/dL — ABNORMAL LOW (ref 6–20)
BUN: 4 mg/dL — ABNORMAL LOW (ref 6–20)
BUN: 4 mg/dL — ABNORMAL LOW (ref 6–20)
Calcium, Ion: 1.24 mmol/L (ref 1.15–1.40)
Calcium, Ion: 1.22 mmol/L (ref 1.15–1.40)
Calcium, Ion: 0.96 mmol/L — ABNORMAL LOW (ref 1.15–1.40)
Calcium, Ion: 0.84 mmol/L — CL (ref 1.15–1.40)
Calcium, Ion: 1.23 mmol/L (ref 1.15–1.40)
Chloride: 103 mmol/L (ref 98–111)
Chloride: 105 mmol/L (ref 98–111)
Chloride: 103 mmol/L (ref 98–111)
Chloride: 102 mmol/L (ref 98–111)
Chloride: 105 mmol/L (ref 98–111)
Creatinine, Ser: 0.3 mg/dL — ABNORMAL LOW (ref 0.44–1.00)
Creatinine, Ser: 0.3 mg/dL — ABNORMAL LOW (ref 0.44–1.00)
Creatinine, Ser: <0.2 mg/dL — ABNORMAL LOW (ref 0.44–1.00)
Creatinine, Ser: 0.2 mg/dL — ABNORMAL LOW (ref 0.44–1.00)
Creatinine, Ser: <0.2 mg/dL — ABNORMAL LOW (ref 0.44–1.00)
Glucose, Bld: 98 mg/dL (ref 70–99)
Glucose, Bld: 125 mg/dL — ABNORMAL HIGH (ref 70–99)
Glucose, Bld: 132 mg/dL — ABNORMAL HIGH (ref 70–99)
Glucose, Bld: 178 mg/dL — ABNORMAL HIGH (ref 70–99)
Glucose, Bld: 145 mg/dL — ABNORMAL HIGH (ref 70–99)
HCT: 33 % — ABNORMAL LOW (ref 36.0–46.0)
HCT: 38 % (ref 36.0–46.0)
HCT: 24 % — ABNORMAL LOW (ref 36.0–46.0)
HCT: 24 % — ABNORMAL LOW (ref 36.0–46.0)
HCT: 22 % — ABNORMAL LOW (ref 36.0–46.0)
Hemoglobin: 11.2 g/dL — ABNORMAL LOW (ref 12.0–15.0)
Hemoglobin: 12.9 g/dL (ref 12.0–15.0)
Hemoglobin: 8.2 g/dL — ABNORMAL LOW (ref 12.0–15.0)
Hemoglobin: 8.2 g/dL — ABNORMAL LOW (ref 12.0–15.0)
Hemoglobin: 7.5 g/dL — ABNORMAL LOW (ref 12.0–15.0)
Potassium: 4.1 mmol/L (ref 3.5–5.1)
Potassium: 4.1 mmol/L (ref 3.5–5.1)
Potassium: 5.3 mmol/L — ABNORMAL HIGH (ref 3.5–5.1)
Potassium: 5.7 mmol/L — ABNORMAL HIGH (ref 3.5–5.1)
Potassium: 4.1 mmol/L (ref 3.5–5.1)
Sodium: 138 mmol/L (ref 135–145)
Sodium: 138 mmol/L (ref 135–145)
Sodium: 137 mmol/L (ref 135–145)
Sodium: 137 mmol/L (ref 135–145)
Sodium: 139 mmol/L (ref 135–145)
TCO2: 22 mmol/L (ref 22–32)
TCO2: 21 mmol/L — ABNORMAL LOW (ref 22–32)
TCO2: 22 mmol/L (ref 22–32)
TCO2: 22 mmol/L (ref 22–32)
TCO2: 22 mmol/L (ref 22–32)

## 2019-10-15 LAB — CBC
HCT: 33.7 % — ABNORMAL LOW (ref 36.0–46.0)
HCT: 26.7 % — ABNORMAL LOW (ref 36.0–46.0)
Hemoglobin: 11 g/dL — ABNORMAL LOW (ref 12.0–15.0)
Hemoglobin: 8.7 g/dL — ABNORMAL LOW (ref 12.0–15.0)
MCH: 24.3 pg — ABNORMAL LOW (ref 26.0–34.0)
MCH: 24.3 pg — ABNORMAL LOW (ref 26.0–34.0)
MCHC: 32.6 g/dL (ref 30.0–36.0)
MCHC: 32.6 g/dL (ref 30.0–36.0)
MCV: 74.4 fL — ABNORMAL LOW (ref 80.0–100.0)
MCV: 74.6 fL — ABNORMAL LOW (ref 80.0–100.0)
Platelets: 252 10*3/uL (ref 150–400)
Platelets: 219 10*3/uL (ref 150–400)
RBC: 4.53 MIL/uL (ref 3.87–5.11)
RBC: 3.58 MIL/uL — ABNORMAL LOW (ref 3.87–5.11)
RDW: 12.7 % (ref 11.5–15.5)
RDW: 12.8 % (ref 11.5–15.5)
WBC: 38.4 10*3/uL — ABNORMAL HIGH (ref 4.0–10.5)
WBC: 19.7 10*3/uL — ABNORMAL HIGH (ref 4.0–10.5)
nRBC: 0 % (ref 0.0–0.2)
nRBC: 0 % (ref 0.0–0.2)

## 2019-10-15 LAB — HEMOGLOBIN AND HEMATOCRIT, BLOOD
HCT: 23.3 % — ABNORMAL LOW (ref 36.0–46.0)
Hemoglobin: 7.7 g/dL — ABNORMAL LOW (ref 12.0–15.0)

## 2019-10-15 LAB — APTT: aPTT: 31 seconds (ref 24–36)

## 2019-10-15 LAB — ECHO INTRAOPERATIVE TEE
AV Mean grad: 23.7 mmHg
Height: 62 in
Weight: 2010.6 oz

## 2019-10-15 LAB — PREPARE RBC (CROSSMATCH)

## 2019-10-15 LAB — PROTIME-INR
INR: 1.5 — ABNORMAL HIGH (ref 0.8–1.2)
Prothrombin Time: 17.4 seconds — ABNORMAL HIGH (ref 11.4–15.2)

## 2019-10-15 LAB — BASIC METABOLIC PANEL
Anion gap: 10 (ref 5–15)
BUN: <5 mg/dL — ABNORMAL LOW (ref 6–20)
CO2: 18 mmol/L — ABNORMAL LOW (ref 22–32)
Calcium: 7.1 mg/dL — ABNORMAL LOW (ref 8.9–10.3)
Chloride: 107 mmol/L (ref 98–111)
Creatinine, Ser: 0.56 mg/dL (ref 0.44–1.00)
GFR calc Af Amer: >60 mL/min (ref >60)
GFR calc non Af Amer: >60 mL/min (ref >60)
Glucose, Bld: 144 mg/dL — ABNORMAL HIGH (ref 70–99)
Potassium: 5.2 mmol/L — ABNORMAL HIGH (ref 3.5–5.1)
Sodium: 135 mmol/L (ref 135–145)

## 2019-10-15 LAB — PLATELET COUNT: Platelets: 264 10*3/uL (ref 150–400)

## 2019-10-15 LAB — GLUCOSE, CAPILLARY
Glucose-Capillary: 107 mg/dL — ABNORMAL HIGH (ref 70–99)
Glucose-Capillary: 112 mg/dL — ABNORMAL HIGH (ref 70–99)
Glucose-Capillary: 112 mg/dL — ABNORMAL HIGH (ref 70–99)
Glucose-Capillary: 128 mg/dL — ABNORMAL HIGH (ref 70–99)
Glucose-Capillary: 146 mg/dL — ABNORMAL HIGH (ref 70–99)

## 2019-10-15 LAB — ABO/RH: ABO/RH(D): B POS

## 2019-10-15 LAB — POCT PREGNANCY, URINE: Preg Test, Ur: NEGATIVE

## 2019-10-15 LAB — MAGNESIUM: Magnesium: 3.3 mg/dL — ABNORMAL HIGH (ref 1.7–2.4)

## 2019-10-15 SURGERY — REPLACEMENT, AORTIC VALVE, OPEN
Anesthesia: General | Site: Esophagus

## 2019-10-15 MED ORDER — NITROGLYCERIN IN D5W 200-5 MCG/ML-% IV SOLN: 0.0000 ug/min | INTRAVENOUS | Status: DC

## 2019-10-15 MED ORDER — SODIUM CHLORIDE (PF) 0.9 % IJ SOLN
OROMUCOSAL | Status: DC | PRN
  Administered 2019-10-15 (×3): 4 mL via TOPICAL

## 2019-10-15 MED ORDER — HEPARIN SODIUM (PORCINE) 1000 UNIT/ML IJ SOLN
INTRAMUSCULAR | Status: DC | PRN
  Administered 2019-10-15: 17000 [IU] via INTRAVENOUS

## 2019-10-15 MED ORDER — LACTATED RINGERS IV SOLN: 500.0000 mL | Freq: Once | INTRAVENOUS | Status: DC | PRN

## 2019-10-15 MED ORDER — LACTATED RINGERS IV SOLN: INTRAVENOUS | Status: DC | PRN

## 2019-10-15 MED ORDER — ESMOLOL HCL 100 MG/10ML IV SOLN
INTRAVENOUS | Status: AC
  Filled 2019-10-15: qty 10

## 2019-10-15 MED ORDER — SODIUM CHLORIDE 0.9 % IV SOLN
INTRAVENOUS | Status: DC | PRN
  Administered 2019-10-15: 25 ug/min via INTRAVENOUS

## 2019-10-15 MED ORDER — ROCURONIUM BROMIDE 10 MG/ML (PF) SYRINGE
PREFILLED_SYRINGE | INTRAVENOUS | Status: AC
  Filled 2019-10-15: qty 10

## 2019-10-15 MED ORDER — ONDANSETRON HCL 4 MG/2ML IJ SOLN
INTRAMUSCULAR | Status: DC | PRN
  Administered 2019-10-15: 4 mg via INTRAVENOUS

## 2019-10-15 MED ORDER — ASPIRIN 81 MG PO CHEW: 324.0000 mg | CHEWABLE_TABLET | Freq: Every day | ORAL | Status: DC

## 2019-10-15 MED ORDER — VANCOMYCIN HCL IN DEXTROSE 1-5 GM/200ML-% IV SOLN
1000.0000 mg | Freq: Once | INTRAVENOUS | Status: AC
  Administered 2019-10-15: 1000 mg via INTRAVENOUS
  Filled 2019-10-15: qty 200

## 2019-10-15 MED ORDER — SODIUM CHLORIDE 0.45 % IV SOLN: INTRAVENOUS | Status: DC | PRN

## 2019-10-15 MED ORDER — CHLORHEXIDINE GLUCONATE 0.12 % MT SOLN
15.0000 mL | OROMUCOSAL | Status: AC
  Administered 2019-10-15: 15 mL via OROMUCOSAL

## 2019-10-15 MED ORDER — ALBUMIN HUMAN 5 % IV SOLN
250.0000 mL | INTRAVENOUS | Status: AC | PRN
  Administered 2019-10-15 (×3): 12.5 g via INTRAVENOUS
  Filled 2019-10-15: qty 250

## 2019-10-15 MED ORDER — ARTIFICIAL TEARS OPHTHALMIC OINT
TOPICAL_OINTMENT | OPHTHALMIC | Status: AC
  Filled 2019-10-15: qty 3.5

## 2019-10-15 MED ORDER — SODIUM CHLORIDE 0.9 % IR SOLN
Status: DC | PRN
  Administered 2019-10-15: 5000 mL

## 2019-10-15 MED ORDER — ONDANSETRON HCL 4 MG/2ML IJ SOLN
INTRAMUSCULAR | Status: AC
  Filled 2019-10-15: qty 2

## 2019-10-15 MED ORDER — BISACODYL 5 MG PO TBEC
10.0000 mg | DELAYED_RELEASE_TABLET | Freq: Every day | ORAL | Status: DC
  Administered 2019-10-16 – 2019-10-19 (×4): 10 mg via ORAL
  Filled 2019-10-15 (×4): qty 2

## 2019-10-15 MED ORDER — ASPIRIN EC 325 MG PO TBEC: 325.0000 mg | DELAYED_RELEASE_TABLET | Freq: Every day | ORAL | Status: DC

## 2019-10-15 MED ORDER — DEXMEDETOMIDINE HCL IN NACL 400 MCG/100ML IV SOLN: 0.0000 ug/kg/h | INTRAVENOUS | Status: DC

## 2019-10-15 MED ORDER — FENTANYL CITRATE (PF) 250 MCG/5ML IJ SOLN
INTRAMUSCULAR | Status: AC
  Filled 2019-10-15: qty 20

## 2019-10-15 MED ORDER — MIDAZOLAM HCL 2 MG/2ML IJ SOLN: 2.0000 mg | INTRAMUSCULAR | Status: DC | PRN

## 2019-10-15 MED ORDER — SODIUM CHLORIDE 0.9 % IV SOLN: INTRAVENOUS | Status: DC

## 2019-10-15 MED ORDER — SODIUM CHLORIDE 0.9 % IV SOLN: INTRAVENOUS | Status: DC | PRN

## 2019-10-15 MED ORDER — PHENYLEPHRINE HCL (PRESSORS) 10 MG/ML IV SOLN: INTRAVENOUS | Status: DC | PRN

## 2019-10-15 MED ORDER — INSULIN REGULAR(HUMAN) IN NACL 100-0.9 UT/100ML-% IV SOLN: INTRAVENOUS | Status: DC

## 2019-10-15 MED ORDER — PHENYLEPHRINE 40 MCG/ML (10ML) SYRINGE FOR IV PUSH (FOR BLOOD PRESSURE SUPPORT)
PREFILLED_SYRINGE | INTRAVENOUS | Status: AC
  Filled 2019-10-15: qty 10

## 2019-10-15 MED ORDER — PHENYLEPHRINE 40 MCG/ML (10ML) SYRINGE FOR IV PUSH (FOR BLOOD PRESSURE SUPPORT)
PREFILLED_SYRINGE | INTRAVENOUS | Status: DC | PRN
  Administered 2019-10-15: 40 ug via INTRAVENOUS
  Administered 2019-10-15 (×3): 80 ug via INTRAVENOUS
  Administered 2019-10-15: 120 ug via INTRAVENOUS

## 2019-10-15 MED ORDER — MIDAZOLAM HCL 5 MG/5ML IJ SOLN
INTRAMUSCULAR | Status: DC | PRN
  Administered 2019-10-15: 2 mg via INTRAVENOUS
  Administered 2019-10-15: 1 mg via INTRAVENOUS
  Administered 2019-10-15: 2 mg via INTRAVENOUS
  Administered 2019-10-15: 1 mg via INTRAVENOUS
  Administered 2019-10-15: 2 mg via INTRAVENOUS
  Administered 2019-10-15: 1 mg via INTRAVENOUS
  Administered 2019-10-15: 3 mg via INTRAVENOUS

## 2019-10-15 MED ORDER — ALBUMIN HUMAN 5 % IV SOLN: INTRAVENOUS | Status: DC | PRN

## 2019-10-15 MED ORDER — PHENYLEPHRINE HCL-NACL 20-0.9 MG/250ML-% IV SOLN
0.0000 ug/min | INTRAVENOUS | Status: DC
  Administered 2019-10-15 – 2019-10-16 (×2): 45 ug/min via INTRAVENOUS
  Filled 2019-10-15 (×2): qty 250

## 2019-10-15 MED ORDER — PANTOPRAZOLE SODIUM 40 MG PO TBEC
40.0000 mg | DELAYED_RELEASE_TABLET | Freq: Every day | ORAL | Status: DC
  Administered 2019-10-17 – 2019-10-19 (×3): 40 mg via ORAL
  Filled 2019-10-15 (×3): qty 1

## 2019-10-15 MED ORDER — ESMOLOL HCL 100 MG/10ML IV SOLN
INTRAVENOUS | Status: DC | PRN
  Administered 2019-10-15: 20 mg via INTRAVENOUS

## 2019-10-15 MED ORDER — SODIUM CHLORIDE 0.9 % IV SOLN
250.0000 mL | INTRAVENOUS | Status: DC
  Administered 2019-10-16: 250 mL via INTRAVENOUS

## 2019-10-15 MED ORDER — DOCUSATE SODIUM 100 MG PO CAPS
200.0000 mg | ORAL_CAPSULE | Freq: Every day | ORAL | Status: DC
  Administered 2019-10-16 – 2019-10-19 (×4): 200 mg via ORAL
  Filled 2019-10-15 (×4): qty 2

## 2019-10-15 MED ORDER — ACETAMINOPHEN 650 MG RE SUPP: 650.0000 mg | Freq: Once | RECTAL | Status: DC

## 2019-10-15 MED ORDER — ACETAMINOPHEN 500 MG PO TABS
1000.0000 mg | ORAL_TABLET | Freq: Four times a day (QID) | ORAL | Status: DC
  Administered 2019-10-16 – 2019-10-19 (×12): 1000 mg via ORAL
  Filled 2019-10-15 (×12): qty 2

## 2019-10-15 MED ORDER — MORPHINE SULFATE (PF) 2 MG/ML IV SOLN
1.0000 mg | INTRAVENOUS | Status: DC | PRN
  Administered 2019-10-15 – 2019-10-17 (×3): 2 mg via INTRAVENOUS
  Filled 2019-10-15 (×3): qty 1

## 2019-10-15 MED ORDER — ACETAMINOPHEN 160 MG/5ML PO SOLN: 1000.0000 mg | Freq: Four times a day (QID) | ORAL | Status: DC

## 2019-10-15 MED ORDER — MIDAZOLAM HCL 2 MG/2ML IJ SOLN
INTRAMUSCULAR | Status: AC
  Filled 2019-10-15: qty 2

## 2019-10-15 MED ORDER — SUGAMMADEX SODIUM 200 MG/2ML IV SOLN
INTRAVENOUS | Status: DC | PRN
  Administered 2019-10-15: 120 mg via INTRAVENOUS

## 2019-10-15 MED ORDER — MAGNESIUM SULFATE 4 GM/100ML IV SOLN
4.0000 g | Freq: Once | INTRAVENOUS | Status: AC
  Administered 2019-10-15: 4 g via INTRAVENOUS
  Filled 2019-10-15: qty 100

## 2019-10-15 MED ORDER — POTASSIUM CHLORIDE 10 MEQ/50ML IV SOLN: 10.0000 meq | INTRAVENOUS | Status: AC

## 2019-10-15 MED ORDER — FAMOTIDINE IN NACL 20-0.9 MG/50ML-% IV SOLN
20.0000 mg | Freq: Two times a day (BID) | INTRAVENOUS | Status: AC
  Administered 2019-10-15 (×2): 20 mg via INTRAVENOUS
  Filled 2019-10-15 (×2): qty 50

## 2019-10-15 MED ORDER — PROPOFOL 10 MG/ML IV BOLUS
INTRAVENOUS | Status: AC
  Filled 2019-10-15: qty 20

## 2019-10-15 MED ORDER — DEXTROSE 50 % IV SOLN: 0.0000 mL | INTRAVENOUS | Status: DC | PRN

## 2019-10-15 MED ORDER — METOPROLOL TARTRATE 12.5 MG HALF TABLET
12.5000 mg | ORAL_TABLET | Freq: Once | ORAL | Status: AC
  Administered 2019-10-15: 12.5 mg via ORAL
  Filled 2019-10-15: qty 1

## 2019-10-15 MED ORDER — VANCOMYCIN HCL 1000 MG IV SOLR
INTRAVENOUS | Status: DC | PRN
  Administered 2019-10-15: 1000 mL

## 2019-10-15 MED ORDER — PLASMA-LYTE 148 IV SOLN
INTRAVENOUS | Status: DC | PRN
  Administered 2019-10-15: 500 mL

## 2019-10-15 MED ORDER — SODIUM CHLORIDE 0.9% FLUSH
3.0000 mL | Freq: Two times a day (BID) | INTRAVENOUS | Status: DC
  Administered 2019-10-16 – 2019-10-19 (×6): 3 mL via INTRAVENOUS

## 2019-10-15 MED ORDER — SODIUM CHLORIDE (PF) 0.9 % IJ SOLN
INTRAMUSCULAR | Status: AC
  Filled 2019-10-15: qty 10

## 2019-10-15 MED ORDER — PROTAMINE SULFATE 10 MG/ML IV SOLN
INTRAVENOUS | Status: AC
  Filled 2019-10-15: qty 25

## 2019-10-15 MED ORDER — PROTAMINE SULFATE 10 MG/ML IV SOLN
INTRAVENOUS | Status: DC | PRN
  Administered 2019-10-15 (×3): 20 mg via INTRAVENOUS
  Administered 2019-10-15: 30 mg via INTRAVENOUS
  Administered 2019-10-15: 20 mg via INTRAVENOUS
  Administered 2019-10-15: 30 mg via INTRAVENOUS
  Administered 2019-10-15: 20 mg via INTRAVENOUS

## 2019-10-15 MED ORDER — INSULIN ASPART 100 UNIT/ML ~~LOC~~ SOLN
0.0000 [IU] | SUBCUTANEOUS | Status: DC
  Administered 2019-10-15 – 2019-10-16 (×3): 2 [IU] via SUBCUTANEOUS

## 2019-10-15 MED ORDER — SODIUM CHLORIDE 0.9% FLUSH: 3.0000 mL | INTRAVENOUS | Status: DC | PRN

## 2019-10-15 MED ORDER — ROCURONIUM BROMIDE 10 MG/ML (PF) SYRINGE
PREFILLED_SYRINGE | INTRAVENOUS | Status: DC | PRN
  Administered 2019-10-15: 30 mg via INTRAVENOUS
  Administered 2019-10-15: 70 mg via INTRAVENOUS
  Administered 2019-10-15: 50 mg via INTRAVENOUS

## 2019-10-15 MED ORDER — CHLORHEXIDINE GLUCONATE 4 % EX LIQD: 30.0000 mL | CUTANEOUS | Status: DC

## 2019-10-15 MED ORDER — TRAMADOL HCL 50 MG PO TABS
50.0000 mg | ORAL_TABLET | ORAL | Status: DC | PRN
  Administered 2019-10-16 – 2019-10-17 (×3): 100 mg via ORAL
  Administered 2019-10-17: 50 mg via ORAL
  Administered 2019-10-18: 100 mg via ORAL
  Administered 2019-10-18 (×2): 50 mg via ORAL
  Filled 2019-10-15: qty 1
  Filled 2019-10-15: qty 2
  Filled 2019-10-15 (×2): qty 1
  Filled 2019-10-15 (×3): qty 2

## 2019-10-15 MED ORDER — CHLORHEXIDINE GLUCONATE CLOTH 2 % EX PADS
6.0000 | MEDICATED_PAD | Freq: Every day | CUTANEOUS | Status: DC
  Administered 2019-10-15 – 2019-10-18 (×4): 6 via TOPICAL

## 2019-10-15 MED ORDER — HEPARIN SODIUM (PORCINE) 1000 UNIT/ML IJ SOLN
INTRAMUSCULAR | Status: AC
  Filled 2019-10-15: qty 1

## 2019-10-15 MED ORDER — SODIUM CHLORIDE 0.9% IV SOLUTION: Freq: Once | INTRAVENOUS | Status: DC

## 2019-10-15 MED ORDER — ONDANSETRON HCL 4 MG/2ML IJ SOLN
4.0000 mg | Freq: Four times a day (QID) | INTRAMUSCULAR | Status: DC | PRN
  Administered 2019-10-17: 4 mg via INTRAVENOUS
  Filled 2019-10-15: qty 2

## 2019-10-15 MED ORDER — OXYCODONE HCL 5 MG PO TABS
5.0000 mg | ORAL_TABLET | ORAL | Status: DC | PRN
  Administered 2019-10-15 – 2019-10-18 (×6): 10 mg via ORAL
  Filled 2019-10-15 (×6): qty 2

## 2019-10-15 MED ORDER — LACTATED RINGERS IV SOLN: INTRAVENOUS | Status: DC

## 2019-10-15 MED ORDER — PROPOFOL 10 MG/ML IV BOLUS
INTRAVENOUS | Status: DC | PRN
  Administered 2019-10-15: 40 mg via INTRAVENOUS

## 2019-10-15 MED ORDER — ACETAMINOPHEN 160 MG/5ML PO SOLN: 650.0000 mg | Freq: Once | ORAL | Status: DC

## 2019-10-15 MED ORDER — ACETAMINOPHEN 325 MG PO TABS
650.0000 mg | ORAL_TABLET | Freq: Once | ORAL | Status: AC
  Administered 2019-10-15: 650 mg via ORAL
  Filled 2019-10-15: qty 2

## 2019-10-15 MED ORDER — CHLORHEXIDINE GLUCONATE 0.12 % MT SOLN
15.0000 mL | Freq: Once | OROMUCOSAL | Status: AC
  Administered 2019-10-15: 15 mL via OROMUCOSAL
  Filled 2019-10-15: qty 15

## 2019-10-15 MED ORDER — BISACODYL 10 MG RE SUPP: 10.0000 mg | Freq: Every day | RECTAL | Status: DC

## 2019-10-15 MED ORDER — SODIUM CHLORIDE 0.9 % IV SOLN
1.5000 g | Freq: Two times a day (BID) | INTRAVENOUS | Status: AC
  Administered 2019-10-15 – 2019-10-17 (×4): 1.5 g via INTRAVENOUS
  Filled 2019-10-15 (×4): qty 1.5

## 2019-10-15 MED ORDER — MIDAZOLAM HCL (PF) 10 MG/2ML IJ SOLN
INTRAMUSCULAR | Status: AC
  Filled 2019-10-15: qty 2

## 2019-10-15 MED ORDER — FENTANYL CITRATE (PF) 250 MCG/5ML IJ SOLN
INTRAMUSCULAR | Status: DC | PRN
Start: 2019-10-15 — End: 2019-10-15
  Administered 2019-10-15: 50 ug via INTRAVENOUS
  Administered 2019-10-15 (×2): 100 ug via INTRAVENOUS
  Administered 2019-10-15: 200 ug via INTRAVENOUS
  Administered 2019-10-15: 100 ug via INTRAVENOUS
  Administered 2019-10-15: 50 ug via INTRAVENOUS
  Administered 2019-10-15: 100 ug via INTRAVENOUS

## 2019-10-15 SURGICAL SUPPLY — 107 items
ADAPTER CARDIO PERF ANTE/RETRO (ADAPTER) ×3 IMPLANT
APPLICATOR TIP STD SYR BGAT-SY (MISCELLANEOUS) IMPLANT
ATTRACTOMAT 16X20 MAGNETIC DRP (DRAPES) ×3 IMPLANT
BAG DECANTER FOR FLEXI CONT (MISCELLANEOUS) ×3 IMPLANT
BLADE CLIPPER SURG (BLADE) ×3 IMPLANT
BLADE STERNUM SYSTEM 6 (BLADE) ×3 IMPLANT
BLADE SURG 11 STRL SS (BLADE) ×3 IMPLANT
CANISTER SUCT 3000ML PPV (MISCELLANEOUS) ×3 IMPLANT
CANNULA EZ GLIDE AORTIC 21FR (CANNULA) ×6 IMPLANT
CANNULA GUNDRY RCSP 15FR (MISCELLANEOUS) ×3 IMPLANT
CANNULA MC2 2 STG 29/37 NON-V (CANNULA) ×2 IMPLANT
CANNULA MC2 TWO STAGE (CANNULA) ×1
CANNULA OPTISITE PERFUSION 18F (CANNULA) ×3 IMPLANT
CATH CPB KIT OWEN (MISCELLANEOUS) ×3 IMPLANT
CATH HEART VENT LEFT (CATHETERS) ×2 IMPLANT
CATH THORACIC 36FR (CATHETERS) IMPLANT
CATH THORACIC 36FR RT ANG (CATHETERS) ×3 IMPLANT
CNTNR URN SCR LID CUP LEK RST (MISCELLANEOUS) ×2 IMPLANT
CONT SPEC 4OZ STRL OR WHT (MISCELLANEOUS) ×1
COR-KNOT ELITE COMBO KIT (Prosthesis & Implant Heart) ×3 IMPLANT
COVER SURGICAL LIGHT HANDLE (MISCELLANEOUS) ×6 IMPLANT
DEVICE SUT CK QUICK LOAD MINI (Prosthesis & Implant Heart) ×3 IMPLANT
DRAIN CHANNEL 32F RND 10.7 FF (WOUND CARE) ×3 IMPLANT
DRAPE CARDIOVASCULAR INCISE (DRAPES)
DRAPE CV SPLIT W-CLR ANES SCRN (DRAPES) IMPLANT
DRAPE INCISE IOBAN 66X45 STRL (DRAPES) ×6 IMPLANT
DRAPE PERI GROIN 82X75IN TIB (DRAPES) IMPLANT
DRAPE SLUSH/WARMER DISC (DRAPES) ×3 IMPLANT
DRAPE SRG 135X102X78XABS (DRAPES) IMPLANT
DRSG AQUACEL AG ADV 3.5X14 (GAUZE/BANDAGES/DRESSINGS) ×3 IMPLANT
ELECT REM PT RETURN 9FT ADLT (ELECTROSURGICAL) ×6
ELECTRODE REM PT RTRN 9FT ADLT (ELECTROSURGICAL) ×4 IMPLANT
FELT TEFLON 1X6 (MISCELLANEOUS) ×6 IMPLANT
FIBERTAPE STERNAL CLSR 2 36IN (SUTURE) ×12 IMPLANT
FIBERTAPE STERNAL CLSR 2X36 (SUTURE) ×12 IMPLANT
GAUZE SPONGE 4X4 12PLY STRL (GAUZE/BANDAGES/DRESSINGS) ×3 IMPLANT
GLOVE BIO SURGEON STRL SZ 6 (GLOVE) IMPLANT
GLOVE BIO SURGEON STRL SZ 6.5 (GLOVE) IMPLANT
GLOVE BIO SURGEON STRL SZ7 (GLOVE) IMPLANT
GLOVE BIO SURGEON STRL SZ7.5 (GLOVE) IMPLANT
GLOVE BIOGEL PI IND STRL 6.5 (GLOVE) ×2 IMPLANT
GLOVE BIOGEL PI INDICATOR 6.5 (GLOVE) ×1
GLOVE INDICATOR 7.5 STRL GRN (GLOVE) ×3 IMPLANT
GLOVE ORTHO TXT STRL SZ7.5 (GLOVE) ×9 IMPLANT
GOWN STRL REUS W/ TWL LRG LVL3 (GOWN DISPOSABLE) ×8 IMPLANT
GOWN STRL REUS W/TWL LRG LVL3 (GOWN DISPOSABLE) ×4
HEMOSTAT POWDER SURGIFOAM 1G (HEMOSTASIS) ×9 IMPLANT
INSERT FOGARTY XLG (MISCELLANEOUS) ×6 IMPLANT
KIT BASIN OR (CUSTOM PROCEDURE TRAY) ×3 IMPLANT
KIT CORKSCREW KNTLS 3.9 S/T/P (INSTRUMENTS) ×3 IMPLANT
KIT DEVICE SUT COR-KNOT MIS 5 (INSTRUMENTS) ×3 IMPLANT
KIT DILATOR VASC 18G NDL (KITS) ×3 IMPLANT
KIT DRAINAGE VACCUM ASSIST (KITS) ×3 IMPLANT
KIT SUCTION CATH 14FR (SUCTIONS) ×9 IMPLANT
KIT TURNOVER KIT B (KITS) ×3 IMPLANT
LEAD PACING MYOCARDI (MISCELLANEOUS) ×3 IMPLANT
LINE VENT (MISCELLANEOUS) ×3 IMPLANT
NDL SUT PASSING CERCLAGE MED (SUTURE) ×6
NEEDLE SUT PASSING CERCLAG MED (SUTURE) ×4 IMPLANT
NS IRRIG 1000ML POUR BTL (IV SOLUTION) ×15 IMPLANT
PACK E OPEN HEART (SUTURE) ×3 IMPLANT
PACK OPEN HEART (CUSTOM PROCEDURE TRAY) ×3 IMPLANT
PAD ARMBOARD 7.5X6 YLW CONV (MISCELLANEOUS) ×6 IMPLANT
POSITIONER HEAD DONUT 9IN (MISCELLANEOUS) ×3 IMPLANT
SENSOR MYOCARDIAL TEMP (MISCELLANEOUS) ×3 IMPLANT
SET CARDIOPLEGIA MPS 5001102 (MISCELLANEOUS) ×3 IMPLANT
SET IRRIG TUBING LAPAROSCOPIC (IRRIGATION / IRRIGATOR) ×3 IMPLANT
STRIP PERIGUARD 6X8 (Vascular Products) ×3 IMPLANT
SUT BONE WAX W31G (SUTURE) ×3 IMPLANT
SUT ETHIBON 2 0 V 52N 30 (SUTURE) ×6 IMPLANT
SUT ETHIBON EXCEL 2-0 V-5 (SUTURE) ×27 IMPLANT
SUT ETHIBOND 2 0 SH (SUTURE)
SUT ETHIBOND 2 0 SH 36X2 (SUTURE) IMPLANT
SUT ETHIBOND 2 0 V4 (SUTURE) IMPLANT
SUT ETHIBOND 2 0 V5 (SUTURE) ×3 IMPLANT
SUT ETHIBOND 2 0V4 GREEN (SUTURE) IMPLANT
SUT ETHIBOND 4 0 RB 1 (SUTURE) IMPLANT
SUT ETHIBOND V-5 VALVE (SUTURE) ×27 IMPLANT
SUT ETHIBOND X763 2 0 SH 1 (SUTURE) ×12 IMPLANT
SUT MNCRL AB 3-0 PS2 18 (SUTURE) ×6 IMPLANT
SUT PDS AB 1 CTX 36 (SUTURE) ×6 IMPLANT
SUT PROLENE 3 0 SH1 36 (SUTURE) ×12 IMPLANT
SUT PROLENE 4 0 RB 1 (SUTURE) ×20
SUT PROLENE 4 0 SH DA (SUTURE) ×9 IMPLANT
SUT PROLENE 4-0 RB1 .5 CRCL 36 (SUTURE) ×40 IMPLANT
SUT PROLENE 5 0 C 1 36 (SUTURE) IMPLANT
SUT PROLENE 6 0 C 1 30 (SUTURE) ×3 IMPLANT
SUT SILK  1 MH (SUTURE) ×1
SUT SILK 1 MH (SUTURE) ×2 IMPLANT
SUT SILK 2 0 SH CR/8 (SUTURE) IMPLANT
SUT SILK 3 0 SH CR/8 (SUTURE) IMPLANT
SUT STEEL 6MS V (SUTURE) IMPLANT
SUT VIC AB 2-0 CTX 27 (SUTURE) ×3 IMPLANT
SYR 10ML KIT SKIN ADHESIVE (MISCELLANEOUS) IMPLANT
SYSTEM SAHARA CHEST DRAIN ATS (WOUND CARE) ×3 IMPLANT
TAPE CLOTH SURG 4X10 WHT LF (GAUZE/BANDAGES/DRESSINGS) ×3 IMPLANT
TOWEL GREEN STERILE (TOWEL DISPOSABLE) ×3 IMPLANT
TOWEL GREEN STERILE FF (TOWEL DISPOSABLE) ×3 IMPLANT
TRAY FOLEY SLVR 14FR TEMP STAT (SET/KITS/TRAYS/PACK) ×3 IMPLANT
TRAY FOLEY SLVR 16FR TEMP STAT (SET/KITS/TRAYS/PACK) ×3 IMPLANT
TUBE CONNECTING 12X1/4 (SUCTIONS) ×3 IMPLANT
UNDERPAD 30X36 HEAVY ABSORB (UNDERPADS AND DIAPERS) ×3 IMPLANT
VALVE AORTIC TOP HAT (Prosthesis & Implant Heart) ×1 IMPLANT
VALVE AORTIC TOP HAT 21 (Prosthesis & Implant Heart) ×2 IMPLANT
VENT LEFT HEART 12002 (CATHETERS) ×3
WATER STERILE IRR 1000ML POUR (IV SOLUTION) ×6 IMPLANT
YANKAUER SUCT BULB TIP NO VENT (SUCTIONS) ×3 IMPLANT

## 2019-10-15 NOTE — Anesthesia Procedure Notes (Signed)
Procedure Name: Intubation Date/Time: 10/15/2019 8:44 AM Performed by: Barrington Ellison, CRNA Pre-anesthesia Checklist: Patient identified, Emergency Drugs available, Suction available and Patient being monitored Patient Re-evaluated:Patient Re-evaluated prior to induction Oxygen Delivery Method: Circle System Utilized Preoxygenation: Pre-oxygenation with 100% oxygen Induction Type: IV induction Ventilation: Mask ventilation without difficulty Laryngoscope Size: Mac and 3 Grade View: Grade I Tube type: Oral Tube size: 8.0 mm Number of attempts: 1 Airway Equipment and Method: Stylet and Oral airway Placement Confirmation: ETT inserted through vocal cords under direct vision,  positive ETCO2 and breath sounds checked- equal and bilateral Secured at: 20 cm Tube secured with: Tape Dental Injury: Teeth and Oropharynx as per pre-operative assessment

## 2019-10-15 NOTE — Anesthesia Procedure Notes (Signed)
Central Venous Catheter Insertion Performed by: [name], MD, anesthesiologist Start/End9/02/2019 8:18 AM, 10/15/2019 8:22 AM Patient location: Pre-op. Preanesthetic checklist: patient identified, IV checked, site marked, risks and benefits discussed, surgical consent, monitors and equipment checked, pre-op evaluation, timeout performed and anesthesia consent Hand hygiene performed  and maximum sterile barriers used  PA cath was placed.Swan type:thermodilution PA Cath depth:38 Procedure performed without using ultrasound guided technique. Attempts: 1 Patient tolerated the procedure well with no immediate complications.

## 2019-10-15 NOTE — Progress Notes (Signed)
  Echocardiogram TEE has been performed.  Celene Skeen 10/15/2019, 9:36 AM

## 2019-10-15 NOTE — Transfer of Care (Signed)
Immediate Anesthesia Transfer of Care Note  Patient: Patricia Frederick  Procedure(s) Performed: AORTIC VALVE REPLACEMENT (AVR) WITH CARBOMEDICS SUPRA-ANNULAR (TOP HAT) 21 VALVE. (N/A Chest) ASCENDING AORTIC ROOT ENLARGEMENT WITH PERI-GUARD PATCH. (N/A Chest) TRANSESOPHAGEAL ECHOCARDIOGRAM (TEE) (N/A Esophagus)  Patient Location: ICU  Anesthesia Type:General  Level of Consciousness: drowsy and patient cooperative  Airway & Oxygen Therapy: Patient Spontanous Breathing and Patient connected to face mask oxygen  Post-op Assessment: Report given to RN and Patient moving all extremities X 4  Post vital signs: Reviewed and stable  Last Vitals:  Vitals Value Taken Time  BP    Temp 36.1 C 10/15/19 1451  Pulse 103 10/15/19 1451  Resp 16 10/15/19 1451  SpO2 100 % 10/15/19 1451  Vitals shown include unvalidated device data.  Last Pain:  Vitals:   10/15/19 0711  TempSrc:   PainSc: 0-No pain         Complications: No complications documented.

## 2019-10-15 NOTE — Brief Op Note (Signed)
10/15/2019  2:05 PM  PATIENT:  Patricia Frederick  29 y.o. female  PRE-OPERATIVE DIAGNOSIS:  AS  POST-OPERATIVE DIAGNOSIS:  AS  PROCEDURE:  Procedure(s): AORTIC VALVE REPLACEMENT (AVR) WITH CARBOMEDICS SUPRA-ANNULAR (TOP HAT) 21 VALVE. (N/A) ASCENDING AORTIC ROOT ENLARGEMENT WITH PERI-GUARD PATCH. (N/A) TRANSESOPHAGEAL ECHOCARDIOGRAM (TEE) (N/A)  SURGEON:  Surgeon(s) and Role:    * Purcell Nails, MD - Primary  PHYSICIAN ASSISTANT: WAYNE GOLD PA-C  ASSISTANTS: STAFF   ANESTHESIA:   general  EBL:  364 mL  BLOOD ADMINISTERED:none  DRAINS: MEDIASTINAL CHEST DRAINS   LOCAL MEDICATIONS USED:  NONE  SPECIMEN:  Source of Specimen:  AORTIC VALVE LEAFLETS  DISPOSITION OF SPECIMEN:  PATHOLOGY  COUNTS:  YES  TOURNIQUET:  * No tourniquets in log *  DICTATION: .Dragon Dictation  PLAN OF CARE: Admit to inpatient   PATIENT DISPOSITION:  ICU - intubated and hemodynamically stable.   Delay start of Pharmacological VTE agent (>24hrs) due to surgical blood loss or risk of bleeding: yes  COMPLICATIONS: NO KNOWN

## 2019-10-15 NOTE — Progress Notes (Signed)
Patient ID: Patricia Frederick, female   DOB: 07-May-1990, 29 y.o.   MRN: [id number] EVENING ROUNDS NOTE :     [address]       [place] [address]             [phone]                 Day of Surgery Procedure(s) (LRB): AORTIC VALVE REPLACEMENT (AVR) WITH CARBOMEDICS SUPRA-ANNULAR (TOP HAT) 21 VALVE. (N/A) ASCENDING AORTIC ROOT ENLARGEMENT WITH PERI-GUARD PATCH. (N/A) TRANSESOPHAGEAL ECHOCARDIOGRAM (TEE) (N/A)  Total Length of Stay:  LOS: 0 days  BP (!) 102/59   Pulse (!) 116   Temp 99.7 F (37.6 C)   Resp 15   Ht 5\' 2"  (1.575 m)   Wt 57 kg   LMP 09/24/2019 (Exact Date)   SpO2 100%   BMI 22.98 kg/m   .Intake/Output      08/31 0701 - 09/01 0700 09/01 0701 - 09/02 0700   I.V. (mL/kg)  2300 (40.4)   Blood  445   IV Piggyback  450   Total Intake(mL/kg)  3195 (56.1)   Urine (mL/kg/hr)  3400 (6.1)   Blood  809   Total Output  4209   Net  -1014          . sodium chloride 10 mL/hr at 10/15/19 1500  . [START ON 10/16/2019] sodium chloride    . sodium chloride 20 mL/hr at 10/15/19 1500  . sodium chloride 100 mL/hr at 10/15/19 1500  . albumin human 12.5 g (10/15/19 1634)  . cefUROXime (ZINACEF)  IV    . dexmedetomidine (PRECEDEX) IV infusion 0 mcg/kg/hr (10/15/19 1440)  . famotidine (PEPCID) IV 20 mg (10/15/19 1602)  . insulin 0.7 Units/hr (10/15/19 1500)  . lactated ringers    . lactated ringers    . lactated ringers    . magnesium sulfate 4 g (10/15/19 1542)  . nitroGLYCERIN    . phenylephrine (NEO-SYNEPHRINE) Adult infusion 40 mcg/min (10/15/19 1440)  . potassium chloride    . vancomycin       Lab Results  Component Value Date   WBC 38.4 (H) 10/15/2019   HGB 9.5 (L) 10/15/2019   HCT 28.0 (L) 10/15/2019   PLT 252 10/15/2019   GLUCOSE 145 (H) 10/15/2019   ALT 17 10/13/2019   AST 21 10/13/2019   NA 142 10/15/2019   K 3.8 10/15/2019   CL 105 10/15/2019   CREATININE <0.20 (L) 10/15/2019   BUN 4 (L) 10/15/2019   CO2 20 (L) 10/13/2019    TSH 2.740 07/15/2018   INR 1.5 (H) 10/15/2019   HGBA1C 5.6 10/13/2019   Awake neuro intact  Not bleeding  10/15/2019 MD  Beeper [phone] Office [phone] 10/15/2019 4:44 PM

## 2019-10-15 NOTE — Interval H&P Note (Signed)
History and Physical Interval Note:  10/15/2019 7:39 AM  Patricia Frederick  has presented today for surgery, with the diagnosis of AS.  The various methods of treatment have been discussed with the patient and family. After consideration of risks, benefits and other options for treatment, the patient has consented to  Procedure(s): AORTIC VALVE REPLACEMENT (AVR) with Aortic Root Enlargement or root replacement (N/A) ASCENDING AORTIC ROOT REPLACEMENT (N/A) TRANSESOPHAGEAL ECHOCARDIOGRAM (TEE) (N/A) as a surgical intervention.  The patient's history has been reviewed, patient examined, no change in status, stable for surgery.  I have reviewed the patient's chart and labs.  Questions were answered to the patient's satisfaction.     Purcell Nails

## 2019-10-15 NOTE — Anesthesia Procedure Notes (Signed)
Arterial Line Insertion Start/End9/02/2019 7:57 AM, 10/15/2019 8:02 AM Performed by: Rachel Moulds, CRNA, CRNA  Patient location: Pre-op. Preanesthetic checklist: patient identified, risks and benefits discussed and pre-op evaluation Lidocaine 1% used for infiltration Right, radial was placed Catheter size: 20 G Hand hygiene performed  and maximum sterile barriers used  Allen's test indicative of satisfactory collateral circulation Attempts: 1 Procedure performed without using ultrasound guided technique. Following insertion, dressing applied and Biopatch. Post procedure assessment: normal  Patient tolerated the procedure well with no immediate complications.

## 2019-10-15 NOTE — Anesthesia Postprocedure Evaluation (Signed)
Anesthesia Post Note  Patient: Patricia Frederick  Procedure(s) Performed: AORTIC VALVE REPLACEMENT (AVR) WITH CARBOMEDICS SUPRA-ANNULAR (TOP HAT) 21 VALVE. (N/A Chest) ASCENDING AORTIC ROOT ENLARGEMENT WITH PERI-GUARD PATCH. (N/A Chest) TRANSESOPHAGEAL ECHOCARDIOGRAM (TEE) (N/A Esophagus)     Patient location during evaluation: SICU Anesthesia Type: General Level of consciousness: sedated Pain management: pain level controlled Vital Signs Assessment: post-procedure vital signs reviewed and stable Respiratory status: spontaneous breathing and respiratory function stable Cardiovascular status: stable Anesthetic complications: no   No complications documented.  Last Vitals:  Vitals:   10/15/19 0822 10/15/19 1449  BP:  (!) 106/93  Pulse: 78 (!) 102  Resp: (!) 22 15  Temp:  (!) 36 C  SpO2: 100% 100%    Last Pain:  Vitals:   10/15/19 1449  TempSrc: Core  PainSc:                  [name]

## 2019-10-15 NOTE — Anesthesia Procedure Notes (Signed)
Central Venous Catheter Insertion Performed by: [name], MD, anesthesiologist Start/End9/02/2019 8:08 AM, 10/15/2019 8:18 AM Patient location: Pre-op. Preanesthetic checklist: patient identified, IV checked, site marked, risks and benefits discussed, surgical consent, monitors and equipment checked, pre-op evaluation, timeout performed and anesthesia consent Position: Trendelenburg Lidocaine 1% used for infiltration and patient sedated Hand hygiene performed , maximum sterile barriers used  and Seldinger technique used Catheter size: 8.5 Fr Total catheter length 10. Central line was placed.Sheath introducer Swan type:thermodilution PA Cath depth:50 Procedure performed using ultrasound guided technique. Ultrasound Notes:anatomy identified, needle tip was noted to be adjacent to the nerve/plexus identified, no ultrasound evidence of intravascular and/or intraneural injection and image(s) printed for medical record Attempts: 1 Following insertion, line sutured, dressing applied and Biopatch. Post procedure assessment: blood return through all ports, free fluid flow and no air  Patient tolerated the procedure well with no immediate complications.

## 2019-10-15 NOTE — Op Note (Signed)
CARDIOTHORACIC SURGERY OPERATIVE NOTE  Date of Procedure:  10/15/2019  Preoperative Diagnosis: Severe Aortic Stenosis   Postoperative Diagnosis: Same   Procedure:    Aortic Valve Replacement  Sorin [employer]omedics Top Hat bileaflet mechanical valve (size 21 mm, cat # [id number], serial # [id number])  Bovine pericardial patch enlargement of aortic root (Nicks procedure)   Surgeon: Salvatore Decent. 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  Patient emigrated to the [place] in 2012. She was noted to have a heart murmur on physical exam when she was pregnant and an echocardiogram at that time reportedly revealed severe aortic stenosis. She completed her pregnancy without complication and underwent C-section with bilateral tubal ligation. She was referred to [facility] medical cardiology clinic for follow-up postpartum but subsequently lost to follow-up until recently. She was evaluated in the emergency department for dizziness in March of this year and referred to the [facility]. She was seen in follow-up by Dr. Royston Cowper underwent transthoracic echocardiogram September 09, 2019. Echocardiogram revealed normal left ventricular systolic function with severe aortic stenosis. Peak velocity across aortic valve measured 4.4 m/s corresponding to mean transvalvular gradient estimated 40 mmHg and aortic valve area calculated 0.70 cm. The DVI was reported 0.22. Coronary CT angiography with gated CT angiogram of the heart was performed and notable for the absence of  significant coronary artery disease. 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 Cardiothoracic surgical consultation was requested.  The patient has been seen in consultation and counseled at length regarding the indications, risks and potential benefits of surgery.  All questions have been answered, and the patient provides full informed consent for the operation as described.    DETAILS OF THE OPERATIVE PROCEDURE  Preparation:  The patient is brought to the operating room on the above mentioned date and central monitoring was established by the anesthesia team including placement of Swan-Ganz catheter and radial arterial line. The patient is placed in the supine position on the operating table.  Intravenous antibiotics are administered. General endotracheal anesthesia is induced uneventfully. A Foley catheter is placed.  Baseline transesophageal echocardiogram was performed.  Findings were notable for congenitally bicuspid aortic valve with moderately severe aortic stenosis.  There was normal left ventricular size and systolic function.  There is trivial aortic insufficiency.  No other abnormalities were noted.  The patient's chest, abdomen, both groins, and both lower extremities are prepared and draped in a sterile manner. A time out procedure is performed.   Surgical Approach:  A median sternotomy incision was performed and the pericardium is opened. The ascending aorta is small in size but otherwise normal in appearance.    Extracorporeal Cardiopulmonary Bypass and Myocardial Protection:  The ascending aorta and the right atrium are cannulated for cardiopulmonary bypass.  Adequate heparinization is verified.   Attempts to place a retrograde cardioplegia cannula through the right atrium into the coronary sinus are unsuccessful due to small size of the coronary sinus.  The operative field  was continuously flooded with  carbon dioxide gas.  The entire pre-bypass portion of the operation was notable for stable hemodynamics.  Cardiopulmonary bypass was begun and the surface of the heart is inspected.  A left ventricular vent is placed through the right superior pulmonary vein.  A cardioplegia cannula is placed in the ascending aorta.  A temperature probe was placed in the interventricular septum.  The patient is cooled to 32C systemic temperature.  The aortic cross clamp is applied and cardioplegia is delivered initially in an antegrade fashion through the aortic root using modified [place] cold blood cardioplegia ([place] blood cardioplegia protocol).   The initial cardioplegic arrest is rapid with early diastolic arrest.   Myocardial protection was felt to be excellent.   Aortic Root Enlargement and Aortic Valve Replacement:  An oblique transverse aortotomy incision was performed.  The aortic valve was inspected and notable for Sievers type 0 congenitally bicuspid aortic valve.  There are 2 similar size leaflets with no raphae.  The coronary ostia are 180 degrees opposed.  There is severe aortic stenosis.  The aortic valve leaflets were excised sharply.  Decalcification was notably not required.  The aortic annulus was sized and a 19 mm sizer would not comfortably fit.  The aortic root and left ventricle were irrigated with copious cold saline solution.  The aortotomy incision was extended down through the noncoronary sinus of Valsalva across the aortic annulus to facilitate root enlargement using the Nicks technique.  Once this has been completed in 19 mm sizer easily fits and a 21 mm supra annular valve appears to fit.  Root enlargement is performed using an elliptical shape large bovine pericardial patch.  Initially, interrupted horizontal mattress 2-0 Ethibond pledgeted sutures for valve replacement are placed with pledgets in the subannular position.  The valve sutures at the  site of root enlargement are then placed through the nadir of the pericardial patch.  Additional horizontal mattress 4-0 Prolene pledgeted sutures are also placed around the nadir of the pericardial patch.  A Sorin [employer] supraannular bileaflet mechanical prosthetic valve (size 21 mm, cat# [id number], serial # [id number]) was implanted uneventfully. The majority of the valve sutures were secured using a Cor-knot device with exception of those at the base of the root enlargement.  Once the valve was in place the 4-0 Prolene sutures placed at the base of the enlargement are secured.  The valve seated appropriately with adequate space beneath the left main and right coronary artery.  Patch root enlargement and aortic closure is then completed using 2 layer closure of running 4-0 Prolene suture circumferentially around the entire elliptical patch.   Procedure Completion:  One final dose of warm "reanimation dose" cardioplegia was administered through the aortic root.  The aortic cross clamp was removed after a total cross clamp time of 145 minutes.  Epicardial pacing wires are fixed to the right ventricular outflow tract and to the right atrial appendage. The patient is rewarmed to 37C temperature. The aortic and left ventricular vents are removed.  The patient is weaned and disconnected from cardiopulmonary bypass.  The patient's rhythm at separation from bypass was sinus.  The patient was weaned from cardiopulmonary bypass without any inotropic support. Total cardiopulmonary bypass time for the operation was 176 minutes.  Followup transesophageal echocardiogram performed after separation from bypass revealed a well-seated bileaflet mechanical aortic valve prosthesis that was functioning normally and without any sign of paravalvular leak.  Left ventricular function was unchanged from preoperatively.  The aortic and venous cannula  were removed uneventfully. Protamine was administered to reverse  the anticoagulation. The mediastinum and pleural space were inspected for hemostasis and irrigated with saline solution. The mediastinum was drained using 2 chest tubes placed through separate stab incisions inferiorly.  The soft tissues anterior to the aorta were reapproximated loosely. The sternum is closed with double strength sternal wire. The soft tissues anterior to the sternum were closed in multiple layers and the skin is closed with a running subcuticular skin closure.  The post-bypass portion of the operation was notable for stable rhythm and hemodynamics.  No blood products were administered during the operation.   Disposition:  The patient tolerated the procedure well and is transported to the surgical intensive care in stable condition. There are no intraoperative complications. All sponge instrument and needle counts are verified correct at completion of the operation.    Salvatore Decent. Cornelius Moras MD 10/15/2019 2:06 PM

## 2019-10-16 ENCOUNTER — Encounter (HOSPITAL_COMMUNITY): Payer: Self-pay | Admitting: Thoracic Surgery (Cardiothoracic Vascular Surgery)

## 2019-10-16 ENCOUNTER — Inpatient Hospital Stay (HOSPITAL_COMMUNITY): Payer: BC Managed Care – PPO

## 2019-10-16 DIAGNOSIS — Z954 Presence of other heart-valve replacement: Secondary | ICD-10-CM

## 2019-10-16 LAB — ECHOCARDIOGRAM COMPLETE
AR max vel: 0.78 cm2
AV Area VTI: 1.07 cm2
AV Area mean vel: 0.89 cm2
AV Mean grad: 5.5 mmHg
AV Peak grad: 9.2 mmHg
Ao pk vel: 1.52 m/s
Area-P 1/2: 5.88 cm2
Height: 62 in
S' Lateral: 2.2 cm
Weight: 2232.82 oz

## 2019-10-16 LAB — BASIC METABOLIC PANEL
Anion gap: 8 (ref 5–15)
Anion gap: 11 (ref 5–15)
BUN: 5 mg/dL — ABNORMAL LOW (ref 6–20)
BUN: 5 mg/dL — ABNORMAL LOW (ref 6–20)
CO2: 18 mmol/L — ABNORMAL LOW (ref 22–32)
CO2: 19 mmol/L — ABNORMAL LOW (ref 22–32)
Calcium: 7 mg/dL — ABNORMAL LOW (ref 8.9–10.3)
Calcium: 6.9 mg/dL — ABNORMAL LOW (ref 8.9–10.3)
Chloride: 104 mmol/L (ref 98–111)
Chloride: 102 mmol/L (ref 98–111)
Creatinine, Ser: 0.46 mg/dL (ref 0.44–1.00)
Creatinine, Ser: 0.5 mg/dL (ref 0.44–1.00)
GFR calc Af Amer: >60 mL/min (ref >60)
GFR calc Af Amer: >60 mL/min (ref >60)
GFR calc non Af Amer: >60 mL/min (ref >60)
GFR calc non Af Amer: >60 mL/min (ref >60)
Glucose, Bld: 137 mg/dL — ABNORMAL HIGH (ref 70–99)
Glucose, Bld: 133 mg/dL — ABNORMAL HIGH (ref 70–99)
Potassium: 4.5 mmol/L (ref 3.5–5.1)
Potassium: 3.9 mmol/L (ref 3.5–5.1)
Sodium: 130 mmol/L — ABNORMAL LOW (ref 135–145)
Sodium: 132 mmol/L — ABNORMAL LOW (ref 135–145)

## 2019-10-16 LAB — GLUCOSE, CAPILLARY
Glucose-Capillary: 132 mg/dL — ABNORMAL HIGH (ref 70–99)
Glucose-Capillary: 142 mg/dL — ABNORMAL HIGH (ref 70–99)
Glucose-Capillary: 116 mg/dL — ABNORMAL HIGH (ref 70–99)
Glucose-Capillary: 117 mg/dL — ABNORMAL HIGH (ref 70–99)
Glucose-Capillary: 111 mg/dL — ABNORMAL HIGH (ref 70–99)

## 2019-10-16 LAB — CBC
HCT: 27 % — ABNORMAL LOW (ref 36.0–46.0)
HCT: 24.7 % — ABNORMAL LOW (ref 36.0–46.0)
Hemoglobin: 8.7 g/dL — ABNORMAL LOW (ref 12.0–15.0)
Hemoglobin: 8.2 g/dL — ABNORMAL LOW (ref 12.0–15.0)
MCH: 23.7 pg — ABNORMAL LOW (ref 26.0–34.0)
MCH: 25 pg — ABNORMAL LOW (ref 26.0–34.0)
MCHC: 32.2 g/dL (ref 30.0–36.0)
MCHC: 33.2 g/dL (ref 30.0–36.0)
MCV: 73.6 fL — ABNORMAL LOW (ref 80.0–100.0)
MCV: 75.3 fL — ABNORMAL LOW (ref 80.0–100.0)
Platelets: 212 10*3/uL (ref 150–400)
Platelets: 142 10*3/uL — ABNORMAL LOW (ref 150–400)
RBC: 3.67 MIL/uL — ABNORMAL LOW (ref 3.87–5.11)
RBC: 3.28 MIL/uL — ABNORMAL LOW (ref 3.87–5.11)
RDW: 13 % (ref 11.5–15.5)
RDW: 13 % (ref 11.5–15.5)
WBC: 15.8 10*3/uL — ABNORMAL HIGH (ref 4.0–10.5)
WBC: 11.7 10*3/uL — ABNORMAL HIGH (ref 4.0–10.5)
nRBC: 0 % (ref 0.0–0.2)
nRBC: 0 % (ref 0.0–0.2)

## 2019-10-16 LAB — MAGNESIUM
Magnesium: 2.1 mg/dL (ref 1.7–2.4)
Magnesium: 2.1 mg/dL (ref 1.7–2.4)

## 2019-10-16 LAB — SURGICAL PATHOLOGY

## 2019-10-16 MED ORDER — WARFARIN - PHYSICIAN DOSING INPATIENT: Freq: Every day | Status: DC

## 2019-10-16 MED ORDER — WARFARIN SODIUM 2 MG PO TABS
2.0000 mg | ORAL_TABLET | Freq: Every day | ORAL | Status: DC
  Administered 2019-10-16: 2 mg via ORAL
  Filled 2019-10-16: qty 1

## 2019-10-16 MED ORDER — ENOXAPARIN SODIUM 30 MG/0.3ML ~~LOC~~ SOLN: 30.0000 mg | Freq: Every day | SUBCUTANEOUS | Status: DC

## 2019-10-16 MED ORDER — ASPIRIN EC 325 MG PO TBEC
325.0000 mg | DELAYED_RELEASE_TABLET | Freq: Every day | ORAL | Status: AC
  Administered 2019-10-16: 325 mg via ORAL
  Filled 2019-10-16: qty 1

## 2019-10-16 MED ORDER — INSULIN ASPART 100 UNIT/ML ~~LOC~~ SOLN: 0.0000 [IU] | SUBCUTANEOUS | Status: DC

## 2019-10-16 MED ORDER — PERFLUTREN LIPID MICROSPHERE
1.0000 mL | INTRAVENOUS | Status: AC | PRN
  Administered 2019-10-16: 3 mL via INTRAVENOUS
  Filled 2019-10-16: qty 10

## 2019-10-16 MED ORDER — SODIUM CHLORIDE 0.9% FLUSH: 10.0000 mL | INTRAVENOUS | Status: DC | PRN

## 2019-10-16 MED ORDER — SODIUM CHLORIDE 0.9% FLUSH
10.0000 mL | Freq: Two times a day (BID) | INTRAVENOUS | Status: DC
  Administered 2019-10-16 – 2019-10-18 (×3): 10 mL

## 2019-10-16 MED ORDER — ASPIRIN EC 81 MG PO TBEC: 81.0000 mg | DELAYED_RELEASE_TABLET | Freq: Every day | ORAL | Status: DC

## 2019-10-16 MED ORDER — FUROSEMIDE 10 MG/ML IJ SOLN
20.0000 mg | Freq: Two times a day (BID) | INTRAMUSCULAR | Status: AC
  Administered 2019-10-16 – 2019-10-17 (×3): 20 mg via INTRAVENOUS
  Filled 2019-10-16 (×3): qty 2

## 2019-10-16 MED ORDER — COUMADIN BOOK
Freq: Once | Status: AC
  Filled 2019-10-16: qty 1

## 2019-10-16 NOTE — Discharge Summary (Signed)
Physician Discharge Summary  Patient ID: Patricia Frederick MRN: [id number] DOB/AGE: 19-Apr-1990 29 y.o.  Admit date: 10/15/2019 Discharge date: 10/19/2019  Admission Diagnoses: Severe aortic stenosis  Discharge Diagnoses:  Principal Problem:   S/P aortic valve replacement with mechanical valve Active Problems:   Aortic stenosis, severe   Language barrier, speaks Nepali   History of rheumatic heart disease   S/P AVR (aortic valve replacement)  Patient Active Problem List   Diagnosis Date Noted  . S/P aortic valve replacement with mechanical valve 10/15/2019  . S/P AVR (aortic valve replacement) 10/15/2019  . Other cardiovascular diseases of mother, antepartum 02/15/2011  . History of rheumatic heart disease 11/21/2010  . 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 Language barrier, speaks Nepali 11/14/2010  . Supervision of high-risk pregnancy 11/07/2010  . Aortic stenosis, severe   . Abnormal ECG 10/19/2010  . Tachycardia 10/19/2010   Discharged Condition: good  Hospital Course: The patient was admitted electively and on 10/15/2019 taken to the operating room where she underwent the below described procedure.  She tolerated it well was taken to the surgical intensive care unit in stable condition.  Postoperative hospital course:  The patient has done well.  She was extubated using standard post cardiac surgical protocols without difficulty.  She has remained neurologically intact.  She has remained hemodynamically stable in sinus rhythm.  Oxygen has been weaned and she maintains good saturations on room air.  She has a mild postoperative volume overload but is spontaneously diuresing quite well.  Her weight is coming down over time.  She does have an expected acute blood loss anemia which is stable.  She has been started on iron supplementation.  Renal function has remained within normal limits.  She has required some potassium supplementation.  Blood sugars are under good control.  She  is tolerating gradually increasing activities using standard cardiac rehab protocols.  Incisions are noted to be healing well without evidence of infection.  Coumadin has been initiated for her mechanical valve.  INR is to be checked on 10/24/2019 and she will be discharged at 4 mg daily.  At the time of discharge patient is felt to be quite stable.  Consults: None  Significant Diagnostic Studies: routine post op labs and serial CXR's  Treatments: surgery:  CARDIOTHORACIC SURGERY OPERATIVE NOTE  Date of Procedure:                10/15/2019  Preoperative Diagnosis:      Severe Aortic Stenosis   Postoperative Diagnosis:    Same   Procedure:        Aortic Valve Replacement             Sorin Carbomedics Top Hat bileaflet mechanical valve (size 21 mm, cat # [id number], serial # [id number])             Bovine pericardial patch enlargement of aortic root (Nicks procedure)              Surgeon:        Salvatore Decent. Cornelius Moras, MD  Assistant:       Rowe Clack, PA-C  Anesthesia:    [name], MD  Operative Findings: ? Sievers type 0 bicuspid aortic valve ? Severe aortic stenosis ? Normal left ventricular systolic function           Discharge Exam: Blood pressure (!) 88/63, pulse 90, temperature 98.1 F (36.7 C), temperature source Oral, resp. rate 17, height 5\' 2"  (1.575 m), weight 60.6 kg,  last menstrual period 09/24/2019, SpO2 98 %.  General appearance: alert, cooperative and no distress Heart: regular rate and rhythm and crisp click Lungs: mildly dim in bases Abdomen: benign Extremities: trace edema Wound: incis healing well Disposition: Discharge disposition: 01-Home or Self Care       Discharge Instructions    Amb Referral to Cardiac Rehabilitation   Complete by: As directed    Diagnosis: Valve Replacement   Valve: Aortic   After initial evaluation and assessments completed: Virtual Based Care may be provided alone or in conjunction with Phase 2  Cardiac Rehab based on patient barriers.: Yes   Discharge patient   Complete by: As directed    Discharge disposition: 01-Home or Self Care   Discharge patient date: 10/19/2019     Allergies as of 10/19/2019   No Known Allergies     Medication List    TAKE these medications   acetaminophen 500 MG tablet Commonly known as: TYLENOL Take 500 mg by mouth every 6 (six) hours as needed for mild pain.   aspirin 81 MG EC tablet Take 1 tablet (81 mg total) by mouth daily. Swallow whole.   ferrous fumarate-b12-vitamic C-folic acid capsule Commonly known as: TRINSICON / FOLTRIN Take 1 capsule by mouth daily with breakfast.   metoprolol tartrate 25 MG tablet Commonly known as: LOPRESSOR Take 0.5 tablets (12.5 mg total) by mouth 2 (two) times daily.   potassium chloride SA 20 MEQ tablet Commonly known as: KLOR-CON Take 2 tablets (40 mEq total) by mouth once for 1 dose. Start taking on: October 20, 2019   traMADol 50 MG tablet Commonly known as: ULTRAM Take 1-2 tablets (50-100 mg total) by mouth every 6 (six) hours as needed for up to 7 days for moderate pain.   warfarin 2 MG tablet Commonly known as: COUMADIN Take 2 tablets (4 mg total) by mouth daily at 4 PM. As directed by coumadin clinic       Follow-up Information    Purcell Nails, MD Follow up.   Specialty: Cardiothoracic Surgery Why: Please see discharge paperwork for follow-up appointment with surgeon.  Also obtain chest x-ray at [facility] 1/2-hour prior to appointment.  It is located in the same office complex on the first floor. Contact information: [address] [place] [address] [phone]        Wendall Stade, MD Follow up.   Specialty: Cardiology Contact information: [address] [place] [address] [phone]        [facility] Follow up on 12/09/2019.   Specialty: Cardiology Why: at 10:35 AM  Contact information: [address], [address] [place] [address] [phone]              Signed: Noel Christmas 10/19/2019, 8:12 AM

## 2019-10-16 NOTE — Progress Notes (Signed)
  Echocardiogram 2D Echocardiogram has been performed.  Delcie Roch 10/16/2019, 5:30 PM

## 2019-10-16 NOTE — Hospital Course (Signed)
HPI: at time of consultation   Patient is a 29 year old female originally from [place] who has been referred for surgical consultation to discuss treatment options for management of severe symptomatic aortic stenosis.   Patient emigrated to the [place] in 2012.  She was noted to have a heart murmur on physical exam when she was pregnant and an echocardiogram at that time reportedly revealed severe aortic stenosis.  She completed her pregnancy without complication and underwent C-section with bilateral tubal ligation.  She was referred to [facility] medical cardiology clinic for follow-up postpartum but subsequently lost to follow-up until recently.  She was evaluated in the emergency department for dizziness in March of this year and referred to the [facility].  She was seen in follow-up by Dr. Johnsie Cancel and underwent transthoracic echocardiogram September 09, 2019.  Echocardiogram revealed normal left ventricular systolic function with severe aortic stenosis.  Peak velocity across aortic valve measured 4.4 m/s corresponding to mean transvalvular gradient estimated 40 mmHg and aortic valve area calculated 0.70 cm.  The DVI was reported 0.22.  Coronary CT angiography with gated CT angiogram of the heart was performed and notable for the absence of significant coronary artery disease.  Functional anatomy of the aortic valve was felt to be consistent with [place] type 0 bicuspid aortic valve versus possible unicuspid valve.  Cardiothoracic surgical consultation was requested.   Patient is married and lives locally in [place] with her husband.  She speaks limited English and is accompanied by her husband for office consultation visit today who assists with serving as an interpreter for some parts of our discussion.  Patient works in a [employer].  By report this does not require strenuous physical exertion.  The patient has reportedly otherwise been healthy all of her adult life  with the exception of her known heart murmur.  She denies any known history of rheumatic fever during childhood.  She admits to chronic symptoms of exertional shortness of breath and atypical left-sided chest discomfort which occur with moderate and more strenuous physical exertion.  She denies any symptoms of resting shortness of breath or chest pain.  She denies any history of PND, orthopnea, or lower extremity edema.  She has occasional dizzy spells but has not had any syncope.   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  She was originally seen in consultation on September 15, 2019 at which time she described stable symptoms of exertional shortness of breath and chest discomfort consistent with chronic diastolic congestive heart failure, New York Heart Association functional class II.  She has also had some mild dizzy spells without any history of syncope.  We discussed treatment options at length at that time including aortic valve replacement using a mechanical prosthetic valve versus the Ross pulmonary autograft procedure.  We discussed issues related to both including the need for lifelong anticoagulation using warfarin following mechanical aortic valve replacement.  Following her visit the patient and her husband met with Rollen Sox in the anticoagulation clinic at [facility] to discuss further what it would be like for the patient to remain on long-term anticoagulation.  The patient and her husband return to the office today for further consultation with an official interpreter present to assist with discussions.  The patient reports no new problems or complaints over the past week.  Hospital course: Patient  was admitted electively and on 10/15/2019 taken to the operating room where she underwent aortic valve replacement with a  [employer] top hat bileaflet mechanical valve size 21 mm.  Additionally had a bovine pericardial patch enlargement of the aortic root .  (Nicks procedure)  She tolerated the procedure well and was taken to the surgical intensive care unit in stable condition.  Postoperative day #1 the patient is felt to be quite stable.  She was extubated neurologically intact without difficulty using standard protocols.  She did require low-dose neo for blood pressure assistance.  She does have an expected acute blood loss anemia with hematocrit 27%.  This is being monitored clinically.  Swan-Ganz and arterial lines were removed on postoperative day #1.  She has some volume overload and was diuresing spontaneously.  She may require some Lasix.  She has been started on Coumadin for her mechanical valve.

## 2019-10-16 NOTE — Progress Notes (Signed)
      [address]       [place] [address]             [phone]      POD # 1 AVR/ root enlargement  BP 104/83   Pulse (!) 101   Temp 98.4 F (36.9 C) (Oral)   Resp 16   Ht 5\' 2"  (1.575 m)   Wt 63.3 kg Comment: standing  LMP 09/24/2019 (Exact Date)   SpO2 94%   BMI 25.52 kg/m   Off neo  Intake/Output Summary (Last 24 hours) at 10/16/2019 1701 Last data filed at 10/16/2019 1553 Gross per 24 hour  Intake 2539.01 ml  Output 1857 ml  Net 682.01 ml   CBG well controlled PM labs pending  Continue current Rx  12/16/2019 C. Viviann Spare, MD [facility] [phone]

## 2019-10-16 NOTE — Progress Notes (Addendum)
[facility] DAILY ICU PROGRESS NOTE                   [address]            [place] [address]          [phone]   1 Day Post-Op Procedure(s) (LRB): AORTIC VALVE REPLACEMENT (AVR) WITH CARBOMEDICS SUPRA-ANNULAR (TOP HAT) 21 VALVE. (N/A) ASCENDING AORTIC ROOT ENLARGEMENT WITH PERI-GUARD PATCH. (N/A) TRANSESOPHAGEAL ECHOCARDIOGRAM (TEE) (N/A)  Total Length of Stay:  LOS: 1 day   Subjective: Sternotomy pain  Objective: Vital signs in last 24 hours: Temp:  [96.8 F (36 C)-100.4 F (38 C)] 99.7 F (37.6 C) (09/02 0630) Pulse Rate:  [72-120] 101 (09/02 0630) Resp:  [11-28] 25 (09/02 0630) BP: (80-112)/(59-93) 112/87 (09/02 0600) SpO2:  [93 %-100 %] 96 % (09/02 0630) Arterial Line BP: (88-126)/(53-82) 126/76 (09/02 0630) Weight:  [63.3 kg] 63.3 kg (09/02 0500)  Filed Weights   10/15/19 0707 10/16/19 0500  Weight: 57 kg 63.3 kg    Weight change:    Hemodynamic parameters for last 24 hours: PAP: (15-275)/(-31-249) 35/18 CO:  [2.8 L/min-3.7 L/min] 2.8 L/min CI:  [1.8 L/min/m2-2.3 L/min/m2] 1.8 L/min/m2  Intake/Output from previous day: 09/01 0701 - 09/02 0700 In: 6289.3 [P.O.:200; I.V.:3848.9; Blood:445; IV Piggyback:1795.4] Out: 6121 [Urine:5060; Blood:809; Chest Tube:252]  Intake/Output this shift: No intake/output data recorded.  Current Meds: Scheduled Meds: . acetaminophen  1,000 mg Oral Q6H  . aspirin EC  325 mg Oral Daily  . [START ON 10/17/2019] aspirin EC  81 mg Oral Daily  . bisacodyl  10 mg Oral Daily   Or  . bisacodyl  10 mg Rectal Daily  . Chlorhexidine Gluconate Cloth  6 each Topical Daily  . docusate sodium  200 mg Oral Daily  . [START ON 10/17/2019] enoxaparin (LOVENOX) injection  30 mg Subcutaneous QHS  . insulin aspart  0-24 Units Subcutaneous Q4H  . [START ON 10/17/2019] pantoprazole  40 mg Oral Daily  . sodium chloride flush  3 mL Intravenous Q12H  . warfarin  2 mg Oral q1600  . Warfarin - Physician Dosing Inpatient   Does not apply  q1600   Continuous Infusions: . sodium chloride    . albumin human Stopped (10/15/19 1700)  . cefUROXime (ZINACEF)  IV Stopped (10/16/19 0455)  . lactated ringers    . lactated ringers    . phenylephrine (NEO-SYNEPHRINE) Adult infusion 45 mcg/min (10/16/19 0600)   PRN Meds:.albumin human, morphine injection, ondansetron (ZOFRAN) IV, oxyCODONE, sodium chloride flush, traMADol  General appearance: alert, cooperative, fatigued and no distress Neurologic: intact Heart: regular rate and rhythm Lungs: dim in bases Abdomen: benign Extremities: no edema Wound: dressings in place  Lab Results: CBC: Recent Labs    10/15/19 2011 10/16/19 0422  WBC 19.7* 15.8*  HGB 8.7* 8.7*  HCT 26.7* 27.0*  PLT 219 212   BMET:  Recent Labs    10/15/19 2011 10/16/19 0422  NA 135 130*  K 5.2* 4.5  CL 107 104  CO2 18* 18*  GLUCOSE 144* 137*  BUN <5* 5*  CREATININE 0.56 0.46  CALCIUM 7.1* 7.0*    CMET: Lab Results  Component Value Date   WBC 15.8 (H) 10/16/2019   HGB 8.7 (L) 10/16/2019   HCT 27.0 (L) 10/16/2019   PLT 212 10/16/2019   GLUCOSE 137 (H) 10/16/2019   ALT 17 10/13/2019   AST 21 10/13/2019   NA 130 (L) 10/16/2019   K 4.5 10/16/2019  CL 104 10/16/2019   CREATININE 0.46 10/16/2019   BUN 5 (L) 10/16/2019   CO2 18 (L) 10/16/2019   TSH 2.740 07/15/2018   INR 1.5 (H) 10/15/2019   HGBA1C 5.6 10/13/2019      PT/INR:  Recent Labs    10/15/19 1512  LABPROT 17.4*  INR 1.5*   Radiology: [facility] Chest Port 1 View  Result Date: 10/16/2019 CLINICAL DATA:  Open-heart surgery.  Chest tube. EXAM: PORTABLE CHEST 1 VIEW COMPARISON:  10/15/2019. FINDINGS: Patient is rotated to the right. Swan-Ganz catheter,, mediastinal drainage catheter, chest tube in stable position. Cardiac valve replacement. Cardiomegaly. Low lung volumes. Mild bibasilar infiltrates/edema and small bilateral pleural effusions. Mild CHF could present this fashion. Gastric distention noted. IMPRESSION: 1.  Lines and  tubes in stable position.  No pneumothorax. 2. Cardiomegaly. Low lung volumes. Mild bibasilar infiltrates/edema and small bilateral pleural effusions suggesting mild CHF. 3.  Gastric distention. Electronically Signed   By: Maisie Fus  Register   On: 10/16/2019 07:14   DG Chest Port 1 View  Result Date: 10/15/2019 CLINICAL DATA:  Status post aortic valve replacement, postoperative examination EXAM: PORTABLE CHEST 1 VIEW COMPARISON:  None. FINDINGS: Lung volumes are small. There is mild left basilar atelectasis and slight asymmetric relative left-sided volume loss. No pneumothorax or pleural effusion. Right internal jugular Swan-Ganz catheter seen with its tip within the main pulmonary artery. Mediastinal drain is in place. Left basilar chest tube extends into the retrocardiac region. Aortic valve replacement has been performed. Cardiac size within normal limits. Pulmonary vascularity is normal. No acute bone abnormality. IMPRESSION: 1. Support lines and tubes as described. 2. Mild left basilar atelectasis. 3. No pneumothorax or pleural effusion. Electronically Signed   By: Helyn Numbers MD   On: 10/15/2019 15:24   ECHO INTRAOPERATIVE TEE  Result Date: 10/15/2019  *INTRAOPERATIVE TRANSESOPHAGEAL REPORT *  Patient Name:   Patricia Frederick Date of Exam: 10/15/2019 Medical Rec #:  [id number]            Height:       62.0 in Accession #:    [id number]           Weight:       125.7 lb Date of Birth:  [birth year]            BSA:          1.57 m Patient Age:    29 years             BP:           114/84 mmHg Patient Gender: F                    HR:           78 bpm. Exam Location:  Anesthesiology Transesophogeal exam was perform intraoperatively during surgical procedure. Patient was closely monitored under general anesthesia during the entirety of examination. Indications:     aortic stenosis I35.0 Performing Phys: [id number] Leaf[address] Motsinger H Liz Pinho Complications: No known complications during this procedure. POST-OP IMPRESSIONS - Left  Ventricle: The left ventricle is unchanged from pre-bypass. - Aorta: The aorta appears unchanged from pre-bypass. - Aortic Valve: A bileaflet mechanical valve was placed, leaflets are freely mobile Size; 106mm. No regurgitation post repair. Normal washing jets for valve type. No perivalvular leak noted.Mean gradient from 6-14mmHg. - Mitral Valve: The mitral valve appears unchanged from pre-bypass. - Tricuspid Valve: The tricuspid valve appears unchanged from pre-bypass. PRE-OP FINDINGS  Left Ventricle: The left  ventricle has normal systolic function, with an ejection fraction of 60-65%. The cavity size was normal. There is mildly increased left ventricular wall thickness. Right Ventricle: The right ventricle has normal systolic function. The cavity was normal. There is no increase in right ventricular wall thickness. Left Atrium: Left atrial size was normal in size. The left atrial appendage is well visualized and there is no evidence of thrombus present. Right Atrium: Right atrial size was normal in size. Right atrial pressure is estimated at 10 mmHg. Interatrial Septum: No atrial level shunt detected by color flow Doppler. Pericardium: There is no evidence of pericardial effusion. Mitral Valve: The mitral valve is normal in structure. No thickening of the mitral valve leaflet. Mitral valve regurgitation is trivial by color flow Doppler. There is no evidence of mitral valve vegetation. Tricuspid Valve: The tricuspid valve was normal in structure. Tricuspid valve regurgitation is mild by color flow Doppler. There is no evidence of tricuspid valve vegetation. Aortic Valve: The aortic valve is bicuspid There is mild thickening of the aortic valve Aortic valve regurgitation is trivial by color flow Doppler. There is no evidence of a vegetation on the aortic valve. Pulmonic Valve: The pulmonic valve was normal in structure. Pulmonic valve regurgitation is trivial by color flow Doppler. +-------------+---------++ AORTIC  VALVE           +-------------+---------++ AV Mean Grad:23.7 mmHg +-------------+---------++  [name] MD Electronically signed by [name] MD Signature Date/Time: 10/15/2019/3:20:15 PM    Final      Assessment/Plan: S/P Procedure(s) (LRB): AORTIC VALVE REPLACEMENT (AVR) WITH CARBOMEDICS SUPRA-ANNULAR (TOP HAT) 21 VALVE. (N/A) ASCENDING AORTIC ROOT ENLARGEMENT WITH PERI-GUARD PATCH. (N/A) TRANSESOPHAGEAL ECHOCARDIOGRAM (TEE) (N/A)  POD#1  1 some sternotomy pain but overall feels ok 2  CI 1.82 ,PAP 33/16  weaning neo off, hopefully wont need inotrope 3 sats good on RA 4 Tmax 100.4 5 some volume overload but spont diuresis is pretty good, will prob need some lasix in future, observe for now. Mild hyponatremia, K+ and Mg++ ok 6 expected acute blood loss anemia- monitor, not in transfusion threshold 7 CXR shows some mild CHF findings 8 BS controlled- not a diabetic, transition off insulin per routine protocole 9 EKG unremarkable 10 routine pulm toilet/rehab 11 CT- minor drainage, d/c tubes soon 12 start coumadin for mechanical valve Rowe Clack  PA-C Pager [phone] 10/16/2019 8:10 AM    I have seen and examined the patient and agree with the assessment and plan as outlined.  Doing well POD1.   Mobilize.  Wean Neo drip as tolerated.  D/C lines.  D/C tubes later today or tomorrow, depending on output  Purcell Nails, MD 10/16/2019 8:34 AM

## 2019-10-16 NOTE — Discharge Instructions (Signed)
Surgical Aortic Valve Replacement, Care After This sheet gives you information about how to care for yourself after your procedure. Your health care provider may also give you more specific instructions. If you have problems or questions, contact your health care provider. What can I expect after the procedure? After the procedure, it is common to have pain around your incision area. Follow these instructions at home: Medicines  Take over-the-counter and prescription medicines only as told by your health care provider.  If you were prescribed an antibiotic medicine, take it as told by your health care provider. Do not stop taking the antibiotic even if you start to feel better.  If you have a mechanical prosthesis, you may be given a blood thinner called warfarin. Follow instructions carefully on how to take this medicine.  Ask your health care provider if the medicine prescribed to you: ? Requires you to avoid driving or using heavy machinery. ? Can cause constipation. You may need to take actions to prevent or treat constipation, such as:  Take over-the-counter or prescription medicines.  Eat foods that are high in fiber, such as beans, whole grains, and fresh fruits and vegetables.  Limit foods that are high in fat and processed sugars, such as fried or sweet foods. Eating and drinking      Limit how much caffeine you drink. Caffeine can affect your heart's rate and rhythm.  Do not drink alcohol if: ? Your health care provider tells you not to drink. ? You are pregnant, may be pregnant, or are planning to become pregnant.  Drink enough fluid to keep your urine pale yellow.  Eat a heart-healthy diet that includes fruits, vegetables, whole grains, low-fat dairy products, and lean proteins like poultry and eggs. Incision care   Follow instructions from your health care provider about how to take care of your incision. Make sure you: ? Wash your hands with soap and water before  and after you change your bandage (dressing). If soap and water are not available, use hand sanitizer. ? Change your dressing as told by your health care provider. ? Leave stitches (sutures), skin glue, or adhesive strips in place. These skin closures may need to stay in place for 2 weeks or longer. If adhesive strip edges start to loosen and curl up, you may trim the loose edges. Do not remove adhesive strips completely unless your health care provider tells you to do that.  Check your incision area every day for signs of infection. Check for: ? Redness, swelling, or increasing pain. ? Fluid or blood. ? Warmth. ? Pus or a bad smell. Activity  Return to your normal activities as told by your health care provider. Most patients will need to limit any lifting or strenuous activity for 4-6 weeks.  Avoid sitting for a long time without moving. Get up to take short walks every 1-2 hours.  Do exercises as told by your health care provider.  Do not lift anything that is heavier than 10 lb (4.5 kg), or the limit that you are told, until your health care provider says that it is safe.  Avoid pushing or pulling things with your arms until your health care provider approves. This includes pulling on handrails to help you climb stairs. Lifestyle  Do not use any products that contain nicotine or tobacco, such as cigarettes, e-cigarettes, and chewing tobacco. These can delay incision healing after surgery. If you need help quitting, ask your health care provider.  Resume sexual activity as told   by your health care provider. If you have erectile dysfunction, do not use medicines to treat this condition unless your health care provider approves.  Work with your health care provider to: ? Keep your blood pressure and cholesterol under control. ? Manage any other heart conditions that you have. ? Maintain a healthy weight. Driving and travel  Do not drive until your health care provider approves. Ask  your health care provider when it is safe for you to drive.  Avoid airplane travel for as long as told by your health care provider.  When you travel, bring a list of your medicines and a record of your medical history with you. Carry your medicines with you. General instructions  Do not take baths, swim, or use a hot tub until your health care provider approves. You may shower  Do not strain to have a bowel movement.  Avoid crossing your legs while sitting down.  Check your temperature every day for a fever. A fever may be a sign of infection.  If you are a woman and you plan to become pregnant, talk with your health care provider before you become pregnant.  Wear compression stockings as told by your health care provider. These stockings help to prevent blood clots and reduce swelling in your legs.  Tell all health care providers who care for you that you have an artificial (prosthetic) aortic valve. Also, tell them if you have or have had heart disease or endocarditis.  You will be given a card at discharge. The card indicates the type of prosthetic valve that you have. Keep this card in your wallet or purse for quick reference in case of an emergency.  Keep all follow-up visits as told by your health care provider. This is important. Contact a health care provider if:  You develop a skin rash.  You experience sudden, unexplained changes in your weight.  You have redness, swelling, or increasing pain around your incision.  You have fluid or blood coming from your incision.  Your incision feels warm to the touch.  You have pus or a bad smell coming from your incision.  You have a fever. Get help right away if you:  Develop chest pain that is different from the pain coming from your incision.  Develop shortness of breath or difficulty breathing.  Start to feel light-headed. These symptoms may represent a serious problem that is an emergency. Do not wait to see if the  symptoms will go away. Get medical help right away. Call your local emergency services (911 in the [place] Do not drive yourself to the hospital. Summary  After this procedure, it is common to have pain in the incision area.  Eat a heart-healthy diet. Follow instructions about alcohol use.  Get up to walk often. Avoid pushing and pulling with your arms. Ask what activities are safe for you.  Care for your incision as told by your health care provider. Check it daily for signs of infection.  Get help right away if you have chest pain that is different from your incisions, develop shortness of breath or difficulty breathing, or start to feel light-headed. This information is not intended to replace advice given to you by your health care provider. Make sure you discuss any questions you have with your health care provider. Document Revised: 10/25/2017 Document Reviewed: 10/25/2017 Elsevier Patient Education  2020 [employer]  

## 2019-10-16 NOTE — Plan of Care (Signed)
  Problem: Education: Goal: Knowledge of disease or condition will improve Outcome: Progressing   Problem: Education: Goal: Knowledge of the prescribed therapeutic regimen will improve Outcome: Progressing   Problem: Activity: Goal: Risk for activity intolerance will decrease Outcome: Progressing   Problem: Cardiac: Goal: Will achieve and/or maintain hemodynamic stability Outcome: Progressing   Problem: Clinical Measurements: Goal: Postoperative complications will be avoided or minimized Outcome: Progressing   Problem: Skin Integrity: Goal: Wound healing without signs and symptoms of infection Outcome: Progressing   Problem: Skin Integrity: Goal: Risk for impaired skin integrity will decrease Outcome: Progressing   Problem: Urinary Elimination: Goal: Ability to achieve and maintain adequate renal perfusion and functioning will improve Outcome: Progressing   Problem: Education: Goal: Will demonstrate proper wound care and an understanding of methods to prevent future damage Outcome: Not Progressing   Problem: Respiratory: Goal: Respiratory status will improve Outcome: Completed/Met   Problem: Education: Goal: Individualized Educational Video(s) Outcome: Not Applicable

## 2019-10-16 NOTE — Plan of Care (Signed)
  Problem: Education: Goal: Knowledge of disease or condition will improve Outcome: Progressing Goal: Knowledge of the prescribed therapeutic regimen will improve Outcome: Progressing   Problem: Activity: Goal: Risk for activity intolerance will decrease Outcome: Progressing   Problem: Cardiac: Goal: Will achieve and/or maintain hemodynamic stability Outcome: Progressing   Problem: Clinical Measurements: Goal: Postoperative complications will be avoided or minimized Outcome: Progressing   Problem: Skin Integrity: Goal: Wound healing without signs and symptoms of infection Outcome: Progressing Goal: Risk for impaired skin integrity will decrease Outcome: Progressing   Problem: Urinary Elimination: Goal: Ability to achieve and maintain adequate renal perfusion and functioning will improve Outcome: Progressing   Problem: Education: Goal: Knowledge of General Education information will improve Description: Including pain rating scale, medication(s)/side effects and non-pharmacologic comfort measures Outcome: Progressing   Problem: Health Behavior/Discharge Planning: Goal: Ability to manage health-related needs will improve Outcome: Progressing   Problem: Clinical Measurements: Goal: Ability to maintain clinical measurements within normal limits will improve Outcome: Progressing Goal: Will remain free from infection Outcome: Progressing Goal: Diagnostic test results will improve Outcome: Progressing Goal: Respiratory complications will improve Outcome: Progressing Goal: Cardiovascular complication will be avoided Outcome: Progressing   Problem: Activity: Goal: Risk for activity intolerance will decrease Outcome: Progressing   Problem: Nutrition: Goal: Adequate nutrition will be maintained Outcome: Progressing   Problem: Coping: Goal: Level of anxiety will decrease Outcome: Progressing   Problem: Elimination: Goal: Will not experience complications related to  bowel motility Outcome: Progressing Goal: Will not experience complications related to urinary retention Outcome: Progressing   Problem: Pain Managment: Goal: General experience of comfort will improve Outcome: Progressing   Problem: Safety: Goal: Ability to remain free from injury will improve Outcome: Progressing   Problem: Skin Integrity: Goal: Risk for impaired skin integrity will decrease Outcome: Progressing

## 2019-10-17 ENCOUNTER — Inpatient Hospital Stay (HOSPITAL_COMMUNITY): Payer: BC Managed Care – PPO

## 2019-10-17 LAB — BASIC METABOLIC PANEL
Anion gap: 7 (ref 5–15)
BUN: 6 mg/dL (ref 6–20)
CO2: 23 mmol/L (ref 22–32)
Calcium: 7.5 mg/dL — ABNORMAL LOW (ref 8.9–10.3)
Chloride: 105 mmol/L (ref 98–111)
Creatinine, Ser: 0.51 mg/dL (ref 0.44–1.00)
GFR calc Af Amer: >60 mL/min (ref >60)
GFR calc non Af Amer: >60 mL/min (ref >60)
Glucose, Bld: 116 mg/dL — ABNORMAL HIGH (ref 70–99)
Potassium: 4 mmol/L (ref 3.5–5.1)
Sodium: 135 mmol/L (ref 135–145)

## 2019-10-17 LAB — CBC
HCT: 25.2 % — ABNORMAL LOW (ref 36.0–46.0)
Hemoglobin: 8.2 g/dL — ABNORMAL LOW (ref 12.0–15.0)
MCH: 24.6 pg — ABNORMAL LOW (ref 26.0–34.0)
MCHC: 32.5 g/dL (ref 30.0–36.0)
MCV: 75.4 fL — ABNORMAL LOW (ref 80.0–100.0)
Platelets: 149 10*3/uL — ABNORMAL LOW (ref 150–400)
RBC: 3.34 MIL/uL — ABNORMAL LOW (ref 3.87–5.11)
RDW: 13.2 % (ref 11.5–15.5)
WBC: 14.3 10*3/uL — ABNORMAL HIGH (ref 4.0–10.5)
nRBC: 0 % (ref 0.0–0.2)

## 2019-10-17 LAB — PROTIME-INR
INR: 1.5 — ABNORMAL HIGH (ref 0.8–1.2)
Prothrombin Time: 17.4 seconds — ABNORMAL HIGH (ref 11.4–15.2)

## 2019-10-17 LAB — GLUCOSE, CAPILLARY
Glucose-Capillary: 114 mg/dL — ABNORMAL HIGH (ref 70–99)
Glucose-Capillary: 139 mg/dL — ABNORMAL HIGH (ref 70–99)
Glucose-Capillary: 99 mg/dL (ref 70–99)

## 2019-10-17 MED ORDER — ASPIRIN EC 81 MG PO TBEC
81.0000 mg | DELAYED_RELEASE_TABLET | Freq: Every day | ORAL | Status: DC
  Administered 2019-10-17 – 2019-10-19 (×3): 81 mg via ORAL
  Filled 2019-10-17 (×3): qty 1

## 2019-10-17 MED ORDER — ENOXAPARIN SODIUM 30 MG/0.3ML ~~LOC~~ SOLN
30.0000 mg | Freq: Every day | SUBCUTANEOUS | Status: DC
  Administered 2019-10-18: 30 mg via SUBCUTANEOUS
  Filled 2019-10-17: qty 0.3

## 2019-10-17 MED ORDER — ~~LOC~~ CARDIAC SURGERY, PATIENT & FAMILY EDUCATION: Freq: Once | Status: DC

## 2019-10-17 MED ORDER — METOPROLOL TARTRATE 12.5 MG HALF TABLET
12.5000 mg | ORAL_TABLET | Freq: Two times a day (BID) | ORAL | Status: DC
  Administered 2019-10-17 – 2019-10-18 (×2): 12.5 mg via ORAL
  Filled 2019-10-17 (×5): qty 1

## 2019-10-17 MED ORDER — FE FUMARATE-B12-VIT C-FA-IFC PO CAPS
1.0000 | ORAL_CAPSULE | Freq: Every day | ORAL | Status: DC
  Administered 2019-10-17 – 2019-10-19 (×3): 1 via ORAL
  Filled 2019-10-17 (×3): qty 1

## 2019-10-17 MED ORDER — WARFARIN SODIUM 2.5 MG PO TABS
2.5000 mg | ORAL_TABLET | Freq: Every day | ORAL | Status: DC
  Administered 2019-10-17 – 2019-10-18 (×2): 2.5 mg via ORAL
  Filled 2019-10-17 (×2): qty 1

## 2019-10-17 MED ORDER — ACETAMINOPHEN 325 MG PO TABS
650.0000 mg | ORAL_TABLET | Freq: Four times a day (QID) | ORAL | Status: DC | PRN
  Administered 2019-10-17 – 2019-10-19 (×2): 650 mg via ORAL
  Filled 2019-10-17 (×2): qty 2

## 2019-10-17 MED FILL — Potassium Chloride Inj 2 mEq/ML: INTRAVENOUS | Qty: 40 | Status: AC

## 2019-10-17 MED FILL — Magnesium Sulfate Inj 50%: INTRAMUSCULAR | Qty: 10 | Status: CN

## 2019-10-17 MED FILL — Lidocaine HCl Local Preservative Free (PF) Inj 2%: INTRAMUSCULAR | Qty: 15 | Status: AC

## 2019-10-17 MED FILL — Heparin Sodium (Porcine) Inj 1000 Unit/ML: INTRAMUSCULAR | Qty: 30 | Status: AC

## 2019-10-17 NOTE — Progress Notes (Signed)
CARDIAC REHAB PHASE I   PRE:  Rate/Rhythm: 99 SR  BP:  Supine: 99/75  Sitting:   Standing:    SaO2: 91-96%RA  MODE:  Ambulation: 470 ft   POST:  Rate/Rhythm: 104 ST  BP:  Supine:   Sitting: 108/79  Standing:    SaO2: 96%RA 1320-1405 Pt walked 470 ft on RA with rolling walker and minimal asst. Stopped once to rest. Reinforced sternal precautions. To bathroom after walk and then to recliner with call bell. Did not use walker in room. Just held my hand. Gave IS and pt got to 500 ml with cues. Encouraged her to do 10x an hour. Did well on RA.   Luetta Nutting, RN BSN  10/17/2019 2:03 PM

## 2019-10-17 NOTE — Progress Notes (Signed)
Mobility Specialist: Progress Note    10/17/19 1720  Mobility  Activity Ambulated in hall  Level of Assistance Independent  Assistive Device Front wheel walker  Distance Ambulated (ft) 890 ft  Mobility Response Tolerated well  Mobility performed by Mobility specialist  Bed Position Semi-fowlers  $Mobility charge 1 Mobility   Pre-Mobility: 94 HR, 87/69 BP, 100% SpO2 Post-Mobility: 98 HR, 103/75 BP, 93% SpO2  Pt started ambulation on 2L/min [place]. Sats maintained at 99% so I turned O2 down to 1L/min [place] and sats maintained in the mid 90s. Pt ended ambulation w/o using O2  and sats held at 95% during walk and 93% in room post walk. Pt had no c/o.   [facility] Chesley Valls Mobility Specialist

## 2019-10-17 NOTE — Progress Notes (Signed)
[facility] DAILY ICU PROGRESS NOTE                   [address]            [place] [id number]          [phone]   2 Days Post-Op Procedure(s) (LRB): AORTIC VALVE REPLACEMENT (AVR) WITH CARBOMEDICS SUPRA-ANNULAR (TOP HAT) 21 VALVE. (N/A) ASCENDING AORTIC ROOT ENLARGEMENT WITH PERI-GUARD PATCH. (N/A) TRANSESOPHAGEAL ECHOCARDIOGRAM (TEE) (N/A)  Total Length of Stay:  LOS: 2 days   Subjective: No specific c/o  Objective: Vital signs in last 24 hours: Temp:  [97.9 F (36.6 C)-99.1 F (37.3 C)] 97.9 F (36.6 C) (09/03 0300) Pulse Rate:  [95-109] 100 (09/03 0700) Cardiac Rhythm: Normal sinus rhythm (09/03 0400) Resp:  [13-25] 20 (09/03 0700) BP: (90-115)/(66-87) 90/72 (09/03 0700) SpO2:  [90 %-100 %] 91 % (09/03 0700) Arterial Line BP: (104-122)/(57-71) 104/62 (09/02 1400) Weight:  [62.6 kg] 62.6 kg (09/03 0500)  Filed Weights   10/15/19 0707 10/16/19 0500 10/17/19 0500  Weight: 57 kg 63.3 kg 62.6 kg    Weight change: 5.6 kg   Hemodynamic parameters for last 24 hours: PAP: (32-34)/(16) 32/16 CO:  [2.8 L/min] 2.8 L/min CI:  [1.8 L/min/m2] 1.8 L/min/m2  Intake/Output from previous day: 09/02 0701 - 09/03 0700 In: 823.2 [P.O.:340; I.V.:383.2; IV Piggyback:100] Out: 2220 [Urine:1910; Chest Tube:310]  Intake/Output this shift: No intake/output data recorded.  Current Meds: Scheduled Meds: . acetaminophen  1,000 mg Oral Q6H  . aspirin EC  81 mg Oral Daily  . bisacodyl  10 mg Oral Daily   Or  . bisacodyl  10 mg Rectal Daily  . Chlorhexidine Gluconate Cloth  6 each Topical Daily  . [facility], Patient & Family Education   Does not apply Once  . docusate sodium  200 mg Oral Daily  . enoxaparin (LOVENOX) injection  30 mg Subcutaneous QHS  . ferrous fumarate-b12-vitamic C-folic acid  1 capsule Oral Q breakfast  . furosemide  20 mg Intravenous BID  . metoprolol tartrate  12.5 mg Oral BID  . pantoprazole  40 mg Oral Daily  . sodium  chloride flush  10-40 mL Intracatheter Q12H  . sodium chloride flush  3 mL Intravenous Q12H  . warfarin  2.5 mg Oral q1600  . Warfarin - Physician Dosing Inpatient   Does not apply q1600   Continuous Infusions: . sodium chloride 20 mL/hr at 10/17/19 0400  . lactated ringers     PRN Meds:.morphine injection, ondansetron (ZOFRAN) IV, oxyCODONE, sodium chloride flush, sodium chloride flush, traMADol  General appearance: alert, cooperative and no distress Heart: regular rate and rhythm Lungs: mildly dim in bases Abdomen: benign Extremities: trace edema Wound: dressings CDI  Lab Results: CBC: Recent Labs    10/16/19 1718 10/17/19 0314  WBC 11.7* 14.3*  HGB 8.2* 8.2*  HCT 24.7* 25.2*  PLT 142* 149*   BMET:  Recent Labs    10/16/19 1718 10/17/19 0314  NA 132* 135  K 3.9 4.0  CL 102 105  CO2 19* 23  GLUCOSE 133* 116*  BUN 5* 6  CREATININE 0.50 0.51  CALCIUM 6.9* 7.5*    CMET: Lab Results  Component Value Date   WBC 14.3 (H) 10/17/2019   HGB 8.2 (L) 10/17/2019   HCT 25.2 (L) 10/17/2019   PLT 149 (L) 10/17/2019   GLUCOSE 116 (H) 10/17/2019   ALT 17 10/13/2019   AST 21 10/13/2019   NA 135 10/17/2019  K 4.0 10/17/2019   CL 105 10/17/2019   CREATININE 0.51 10/17/2019   BUN 6 10/17/2019   CO2 23 10/17/2019   TSH 2.740 07/15/2018   INR 1.5 (H) 10/17/2019   HGBA1C 5.6 10/13/2019      PT/INR:  Recent Labs    10/17/19 0314  LABPROT 17.4*  INR 1.5*   Radiology: [facility] Chest Port 1 View  Result Date: 10/17/2019 CLINICAL DATA:  Open-heart surgery.  Chest tube. EXAM: PORTABLE CHEST 1 VIEW COMPARISON:  10/16/2019. FINDINGS: Interim removal Swan-Ganz catheter. Mediastinal drainage catheter and chest tube in stable position. Cardiac valve replacement. Heart size stable. Bibasilar atelectasis with improved aeration from prior exam. Improved right pleural effusion. No pneumothorax. IMPRESSION: 1. Interim removal Swan-Ganz catheter. Mediastinal drainage catheter chest tube  in stable position. No pneumothorax. 2.  Prior cardiac valve replacement.  Heart size stable. 3. Bibasilar atelectasis with improved aeration from prior exam. Improved right pleural effusion. No evidence of CHF noted on today's exam. Electronically Signed   By: Maisie Fus  Register   On: 10/17/2019 07:08   ECHOCARDIOGRAM COMPLETE  Result Date: 10/16/2019    ECHOCARDIOGRAM REPORT   Patient Name:   Patricia Frederick Date of Exam: 10/16/2019 Medical Rec #:  [id number]            Height:       62.0 in Accession #:    [id number]           Weight:       139.6 lb Date of Birth:  [birth year]            BSA:          1.641 m Patient Age:    29 years             BP:           104/83 mmHg Patient Gender: F                    HR:           99 bpm. Exam Location:  Inpatient Procedure: 2D Echo Indications:     status post mechanical aortic valve replacement  History:         Patient has prior history of Echocardiogram examinations, most                  recent 09/09/2019.  Sonographer:     Delcie Roch Referring Phys:  [address] [place] Diagnosing Phys: Charlton Haws MD  Sonographer Comments: Suboptimal apical window. IMPRESSIONS  1. Left ventricular ejection fraction, by estimation, is 60 to 65%. The left ventricle has normal function. The left ventricle has no regional wall motion abnormalities. Left ventricular diastolic parameters were normal.  2. Right ventricular systolic function is normal. The right ventricular size is normal. There is normal pulmonary artery systolic pressure.  3. The mitral valve is normal in structure. Trivial mitral valve regurgitation. No evidence of mitral stenosis.  4. Post AVR with 21 mm TopHat bileaflet mechanical valve and Nick's proceedure aortic root enlargement Poor apical windows. No PVL mean and peak gradients 6/10 mmHg normal for valve . The aortic valve has been repaired/replaced. Aortic valve regurgitation is not visualized. No aortic stenosis is present.  5. The inferior vena cava is  normal in size with greater than 50% respiratory variability, suggesting right atrial pressure of 3 mmHg. FINDINGS  Left Ventricle: Left ventricular ejection fraction, by estimation, is 60 to 65%. The left ventricle has normal  function. The left ventricle has no regional wall motion abnormalities. The left ventricular internal cavity size was normal in size. There is  no left ventricular hypertrophy. Left ventricular diastolic parameters were normal. Right Ventricle: The right ventricular size is normal. No increase in right ventricular wall thickness. Right ventricular systolic function is normal. There is normal pulmonary artery systolic pressure. The tricuspid regurgitant velocity is 1.94 m/s, and  with an assumed right atrial pressure of 0 mmHg, the estimated right ventricular systolic pressure is 15.1 mmHg. Left Atrium: Left atrial size was normal in size. Right Atrium: Right atrial size was normal in size. Pericardium: There is no evidence of pericardial effusion. Mitral Valve: The mitral valve is normal in structure. There is mild thickening of the mitral valve leaflet(s). There is mild calcification of the mitral valve leaflet(s). Normal mobility of the mitral valve leaflets. Mild mitral annular calcification. Trivial mitral valve regurgitation. No evidence of mitral valve stenosis. Tricuspid Valve: The tricuspid valve is normal in structure. Tricuspid valve regurgitation is mild . No evidence of tricuspid stenosis. Aortic Valve: Post AVR with 21 mm TopHat bileaflet mechanical valve and Nick's proceedure aortic root enlargement Poor apical windows. No PVL mean and peak gradients 6/10 mmHg normal for valve. The aortic valve has been repaired/replaced. Aortic valve regurgitation is not visualized. No aortic stenosis is present. Aortic valve mean gradient measures 5.5 mmHg. Aortic valve peak gradient measures 9.2 mmHg. Aortic valve area, by VTI measures 1.07 cm. Pulmonic Valve: The pulmonic valve was normal in  structure. Pulmonic valve regurgitation is not visualized. No evidence of pulmonic stenosis. Aorta: The aortic root is normal in size and structure. Venous: The inferior vena cava is normal in size with greater than 50% respiratory variability, suggesting right atrial pressure of 3 mmHg. IAS/Shunts: No atrial level shunt detected by color flow Doppler.  LEFT VENTRICLE PLAX 2D LVIDd:         3.00 cm  Diastology LVIDs:         2.20 cm  LV e' lateral:   8.27 cm/s LV PW:         1.00 cm  LV E/e' lateral: 12.7 LV IVS:        0.90 cm  LV e' medial:    7.40 cm/s LVOT diam:     1.30 cm  LV E/e' medial:  14.2 LV SV:         21 LV SV Index:   13 LVOT Area:     1.33 cm  LEFT ATRIUM           Index       RIGHT ATRIUM           Index LA diam:      2.50 cm 1.52 cm/m  RA Area:     11.00 cm LA Vol (A4C): 20.0 ml 12.19 ml/m RA Volume:   21.70 ml  13.23 ml/m  AORTIC VALVE AV Area (Vmax):    0.78 cm AV Area (Vmean):   0.89 cm AV Area (VTI):     1.07 cm AV Vmax:           152.00 cm/s AV Vmean:          105.600 cm/s AV VTI:            0.198 m AV Peak Grad:      9.2 mmHg AV Mean Grad:      5.5 mmHg LVOT Vmax:         89.45 cm/s LVOT Vmean:  71.050 cm/s LVOT VTI:          0.160 m LVOT/AV VTI ratio: 0.81 MITRAL VALVE                TRICUSPID VALVE MV Area (PHT): 5.88 cm     TR Peak grad:   15.1 mmHg MV Decel Time: 129 msec     TR Vmax:        194.00 cm/s MV E velocity: 105.00 cm/s MV A velocity: 49.70 cm/s   SHUNTS MV E/A ratio:  2.11         Systemic VTI:  0.16 m                             Systemic Diam: 1.30 cm Charlton Haws MD Electronically signed by Charlton Haws MD Signature Date/Time: 10/16/2019/5:38:58 PM    Final      Assessment/Plan: S/P Procedure(s) (LRB): AORTIC VALVE REPLACEMENT (AVR) WITH CARBOMEDICS SUPRA-ANNULAR (TOP HAT) 21 VALVE. (N/A) ASCENDING AORTIC ROOT ENLARGEMENT WITH PERI-GUARD PATCH. (N/A) TRANSESOPHAGEAL ECHOCARDIOGRAM (TEE) (N/A)  POD#2 1 afeb, VSS 2 sats good on 2 liters 3 leukocytosis  trend increased a little, monitor clinically, prob reactive 4 H/H trend a little higher , equalibrating- trinsicon added 5 thrombocytopenia trend improving  6 normal renal fxn, good uop, + volume overload, cont lasix at 20 IV BID for now 7 CT 310 cc /24 h- remove today 8 INR 1.5- coumadin 2.5 ordered for mechanical valve, ASA 81 mg as well 9 d/c epw's tomorrow if no dysrhythmia issue and INR ok 10 BS controlled- not a diabetic- insulin stopped 11 tx to floor, cont routine rehab/pulm toilet  Rowe Clack PA-C Pager [phone] 10/17/2019 8:57 AM

## 2019-10-18 ENCOUNTER — Inpatient Hospital Stay (HOSPITAL_COMMUNITY): Payer: BC Managed Care – PPO

## 2019-10-18 LAB — BASIC METABOLIC PANEL
Anion gap: 8 (ref 5–15)
BUN: 7 mg/dL (ref 6–20)
CO2: 26 mmol/L (ref 22–32)
Calcium: 7.7 mg/dL — ABNORMAL LOW (ref 8.9–10.3)
Chloride: 103 mmol/L (ref 98–111)
Creatinine, Ser: 0.54 mg/dL (ref 0.44–1.00)
GFR calc Af Amer: >60 mL/min (ref >60)
GFR calc non Af Amer: >60 mL/min (ref >60)
Glucose, Bld: 101 mg/dL — ABNORMAL HIGH (ref 70–99)
Potassium: 3.1 mmol/L — ABNORMAL LOW (ref 3.5–5.1)
Sodium: 137 mmol/L (ref 135–145)

## 2019-10-18 LAB — CBC
HCT: 23.6 % — ABNORMAL LOW (ref 36.0–46.0)
Hemoglobin: 7.5 g/dL — ABNORMAL LOW (ref 12.0–15.0)
MCH: 24 pg — ABNORMAL LOW (ref 26.0–34.0)
MCHC: 31.8 g/dL (ref 30.0–36.0)
MCV: 75.6 fL — ABNORMAL LOW (ref 80.0–100.0)
Platelets: 169 10*3/uL (ref 150–400)
RBC: 3.12 MIL/uL — ABNORMAL LOW (ref 3.87–5.11)
RDW: 13.2 % (ref 11.5–15.5)
WBC: 11 10*3/uL — ABNORMAL HIGH (ref 4.0–10.5)
nRBC: 0.3 % — ABNORMAL HIGH (ref 0.0–0.2)

## 2019-10-18 LAB — PROTIME-INR
INR: 1.2 (ref 0.8–1.2)
Prothrombin Time: 14.8 seconds (ref 11.4–15.2)

## 2019-10-18 MED ORDER — SIMETHICONE 80 MG PO CHEW
80.0000 mg | CHEWABLE_TABLET | Freq: Four times a day (QID) | ORAL | Status: DC | PRN
  Filled 2019-10-18: qty 1

## 2019-10-18 MED ORDER — ENOXAPARIN SODIUM 40 MG/0.4ML ~~LOC~~ SOLN
40.0000 mg | Freq: Every day | SUBCUTANEOUS | Status: DC
  Administered 2019-10-18: 40 mg via SUBCUTANEOUS
  Filled 2019-10-18: qty 0.4

## 2019-10-18 NOTE — Progress Notes (Signed)
Mobility Specialist - Progress Note   10/18/19 1249  Mobility  Activity  (Cancel)   Pt currently on bedrest due to having pacing wires removed.   Mamie Levers Mobility Specialist Mobility Specialist Phone: [phone]

## 2019-10-18 NOTE — Progress Notes (Signed)
1:00-1:38pm [place] interpreter's reviewed continued use of inventive spirometer, sternal precautions, moving in the tube. Exercise instructions, temperature precautions, end points of exercise reviewed with the patient and her sister. Mrs Patricia [facility] says that she is interested in phase 2 cardiac rehab. Will place referral. Patient given heart healthy diet to review at home.Gladstone Lighter, RN,BSN 10/18/2019 1:33 PM

## 2019-10-18 NOTE — Progress Notes (Signed)
Pacing wires removed per order.  Patient tolerated procedure well.  Performed bedside with Pamala Duffel, RN.  VS q15 x 4 with bedrest until 1330.

## 2019-10-18 NOTE — Progress Notes (Signed)
[address]       [place] [address]             [phone]      3 Days Post-Op Procedure(s) (LRB): AORTIC VALVE REPLACEMENT (AVR) WITH CARBOMEDICS SUPRA-ANNULAR (TOP HAT) 21 VALVE. (N/A) ASCENDING AORTIC ROOT ENLARGEMENT WITH PERI-GUARD PATCH. (N/A) TRANSESOPHAGEAL ECHOCARDIOGRAM (TEE) (N/A) Subjective:  feels ok, no new issues, C/O   Objective: Vital signs in last 24 hours: Temp:  [98 F (36.7 C)-98.1 F (36.7 C)] 98 F (36.7 C) (09/04 0500) Pulse Rate:  [91-100] 95 (09/04 0500) Cardiac Rhythm: Normal sinus rhythm (09/03 1900) Resp:  [15-18] 18 (09/04 0500) BP: (86-104)/(60-78) 87/60 (09/04 0500) SpO2:  [93 %-99 %] 96 % (09/04 0500) Weight:  [61 kg] 61 kg (09/04 0500)  Hemodynamic parameters for last 24 hours:    Intake/Output from previous day: 09/03 0701 - 09/04 0700 In: 480 [P.O.:480] Out: -  Intake/Output this shift: No intake/output data recorded.  General appearance: alert and cooperative Heart: regular rate and rhythm Lungs: clear to auscultation bilaterally Abdomen: benign Extremities: no edema Wound: dressings CDI  Lab Results: Recent Labs    10/16/19 1718 10/17/19 0314  WBC 11.7* 14.3*  HGB 8.2* 8.2*  HCT 24.7* 25.2*  PLT 142* 149*   BMET:  Recent Labs    10/16/19 1718 10/17/19 0314  NA 132* 135  K 3.9 4.0  CL 102 105  CO2 19* 23  GLUCOSE 133* 116*  BUN 5* 6  CREATININE 0.50 0.51  CALCIUM 6.9* 7.5*    PT/INR:  Recent Labs    10/17/19 0314  LABPROT 17.4*  INR 1.5*   ABG    Component Value Date/Time   PHART 7.298 (L) 10/15/2019 1619   HCO3 20.5 10/15/2019 1619   TCO2 22 10/15/2019 1619   ACIDBASEDEF 5.0 (H) 10/15/2019 1619   O2SAT 99.0 10/15/2019 1619   CBG (last 3)  Recent Labs    10/17/19 0023 10/17/19 0447 10/17/19 0709  GLUCAP 99 114* 139*    Meds Scheduled Meds: . acetaminophen  1,000 mg Oral Q6H  . aspirin EC  81 mg Oral Daily  . bisacodyl  10 mg Oral Daily   Or  . bisacodyl   10 mg Rectal Daily  . Chlorhexidine Gluconate Cloth  6 each Topical Daily  . [facility], Patient & Family Education   Does not apply Once  . docusate sodium  200 mg Oral Daily  . enoxaparin (LOVENOX) injection  30 mg Subcutaneous QHS  . ferrous fumarate-b12-vitamic C-folic acid  1 capsule Oral Q breakfast  . metoprolol tartrate  12.5 mg Oral BID  . pantoprazole  40 mg Oral Daily  . sodium chloride flush  10-40 mL Intracatheter Q12H  . sodium chloride flush  3 mL Intravenous Q12H  . warfarin  2.5 mg Oral q1600  . Warfarin - Physician Dosing Inpatient   Does not apply q1600   Continuous Infusions: . sodium chloride 20 mL/hr at 10/17/19 0400  . lactated ringers     PRN Meds:.acetaminophen, morphine injection, ondansetron (ZOFRAN) IV, oxyCODONE, sodium chloride flush, sodium chloride flush, traMADol  Xrays DG Chest 2 View  Result Date: 10/18/2019 CLINICAL DATA:  Atelectasis EXAM: CHEST - 2 VIEW COMPARISON:  Yesterday FINDINGS: Low volume chest with streaky/indistinct opacity at the bases. An aortic valve is present. No edema or pneumothorax. Small pleural effusions. IMPRESSION: Continued low volume chest with atelectasis and small pleural effusions. No visible pneumothorax. Electronically Signed  By: Marnee Spring M.D.   On: 10/18/2019 07:54   DG Chest Port 1 View  Result Date: 10/17/2019 CLINICAL DATA:  Open-heart surgery.  Chest tube. EXAM: PORTABLE CHEST 1 VIEW COMPARISON:  10/16/2019. FINDINGS: Interim removal Swan-Ganz catheter. Mediastinal drainage catheter and chest tube in stable position. Cardiac valve replacement. Heart size stable. Bibasilar atelectasis with improved aeration from prior exam. Improved right pleural effusion. No pneumothorax. IMPRESSION: 1. Interim removal Swan-Ganz catheter. Mediastinal drainage catheter chest tube in stable position. No pneumothorax. 2.  Prior cardiac valve replacement.  Heart size stable. 3. Bibasilar atelectasis with improved  aeration from prior exam. Improved right pleural effusion. No evidence of CHF noted on today's exam. Electronically Signed   By: Maisie Fus  Register   On: 10/17/2019 07:08   ECHOCARDIOGRAM COMPLETE  Result Date: 10/16/2019    ECHOCARDIOGRAM REPORT   Patient Name:   [facility] Date of Exam: 10/16/2019 Medical Rec #:  [id number]            Height:       62.0 in Accession #:    [id number]           Weight:       139.6 lb Date of Birth:  [birth year]            BSA:          1.641 m Patient Age:    29 years             BP:           104/83 mmHg Patient Gender: F                    HR:           99 bpm. Exam Location:  Inpatient Procedure: 2D Echo Indications:     status post mechanical aortic valve replacement  History:         Patient has prior history of Echocardiogram examinations, most                  recent 09/09/2019.  Sonographer:     Delcie Roch Referring Phys:  [address] [place] Diagnosing Phys: Charlton Haws MD  Sonographer Comments: Suboptimal apical window. IMPRESSIONS  1. Left ventricular ejection fraction, by estimation, is 60 to 65%. The left ventricle has normal function. The left ventricle has no regional wall motion abnormalities. Left ventricular diastolic parameters were normal.  2. Right ventricular systolic function is normal. The right ventricular size is normal. There is normal pulmonary artery systolic pressure.  3. The mitral valve is normal in structure. Trivial mitral valve regurgitation. No evidence of mitral stenosis.  4. Post AVR with 21 mm TopHat bileaflet mechanical valve and Nick's proceedure aortic root enlargement Poor apical windows. No PVL mean and peak gradients 6/10 mmHg normal for valve . The aortic valve has been repaired/replaced. Aortic valve regurgitation is not visualized. No aortic stenosis is present.  5. The inferior vena cava is normal in size with greater than 50% respiratory variability, suggesting right atrial pressure of 3 mmHg. FINDINGS  Left Ventricle:  Left ventricular ejection fraction, by estimation, is 60 to 65%. The left ventricle has normal function. The left ventricle has no regional wall motion abnormalities. The left ventricular internal cavity size was normal in size. There is  no left ventricular hypertrophy. Left ventricular diastolic parameters were normal. Right Ventricle: The right ventricular size is normal. No increase in right ventricular wall thickness. Right ventricular  systolic function is normal. There is normal pulmonary artery systolic pressure. The tricuspid regurgitant velocity is 1.94 m/s, and  with an assumed right atrial pressure of 0 mmHg, the estimated right ventricular systolic pressure is 15.1 mmHg. Left Atrium: Left atrial size was normal in size. Right Atrium: Right atrial size was normal in size. Pericardium: There is no evidence of pericardial effusion. Mitral Valve: The mitral valve is normal in structure. There is mild thickening of the mitral valve leaflet(s). There is mild calcification of the mitral valve leaflet(s). Normal mobility of the mitral valve leaflets. Mild mitral annular calcification. Trivial mitral valve regurgitation. No evidence of mitral valve stenosis. Tricuspid Valve: The tricuspid valve is normal in structure. Tricuspid valve regurgitation is mild . No evidence of tricuspid stenosis. Aortic Valve: Post AVR with 21 mm TopHat bileaflet mechanical valve and Nick's proceedure aortic root enlargement Poor apical windows. No PVL mean and peak gradients 6/10 mmHg normal for valve. The aortic valve has been repaired/replaced. Aortic valve regurgitation is not visualized. No aortic stenosis is present. Aortic valve mean gradient measures 5.5 mmHg. Aortic valve peak gradient measures 9.2 mmHg. Aortic valve area, by VTI measures 1.07 cm. Pulmonic Valve: The pulmonic valve was normal in structure. Pulmonic valve regurgitation is not visualized. No evidence of pulmonic stenosis. Aorta: The aortic root is normal in  size and structure. Venous: The inferior vena cava is normal in size with greater than 50% respiratory variability, suggesting right atrial pressure of 3 mmHg. IAS/Shunts: No atrial level shunt detected by color flow Doppler.  LEFT VENTRICLE PLAX 2D LVIDd:         3.00 cm  Diastology LVIDs:         2.20 cm  LV e' lateral:   8.27 cm/s LV PW:         1.00 cm  LV E/e' lateral: 12.7 LV IVS:        0.90 cm  LV e' medial:    7.40 cm/s LVOT diam:     1.30 cm  LV E/e' medial:  14.2 LV SV:         21 LV SV Index:   13 LVOT Area:     1.33 cm  LEFT ATRIUM           Index       RIGHT ATRIUM           Index LA diam:      2.50 cm 1.52 cm/m  RA Area:     11.00 cm LA Vol (A4C): 20.0 ml 12.19 ml/m RA Volume:   21.70 ml  13.23 ml/m  AORTIC VALVE AV Area (Vmax):    0.78 cm AV Area (Vmean):   0.89 cm AV Area (VTI):     1.07 cm AV Vmax:           152.00 cm/s AV Vmean:          105.600 cm/s AV VTI:            0.198 m AV Peak Grad:      9.2 mmHg AV Mean Grad:      5.5 mmHg LVOT Vmax:         89.45 cm/s LVOT Vmean:        71.050 cm/s LVOT VTI:          0.160 m LVOT/AV VTI ratio: 0.81 MITRAL VALVE                TRICUSPID VALVE MV Area (PHT): 5.88 cm  TR Peak grad:   15.1 mmHg MV Decel Time: 129 msec     TR Vmax:        194.00 cm/s MV E velocity: 105.00 cm/s MV A velocity: 49.70 cm/s   SHUNTS MV E/A ratio:  2.11         Systemic VTI:  0.16 m                             Systemic Diam: 1.30 cm Charlton HawsPeter Nishan MD Electronically signed by Charlton HawsPeter Nishan MD Signature Date/Time: 10/16/2019/5:38:58 PM    Final     Assessment/Plan: S/P Procedure(s) (LRB): AORTIC VALVE REPLACEMENT (AVR) WITH CARBOMEDICS SUPRA-ANNULAR (TOP HAT) 21 VALVE. (N/A) ASCENDING AORTIC ROOT ENLARGEMENT WITH PERI-GUARD PATCH. (N/A) TRANSESOPHAGEAL ECHOCARDIOGRAM (TEE) (N/A)  POD#3 1 conts with steady progress 2 VSS, BP runs a bit low most of the time, not on any meds for HTN.BP- ambulating and tolerating ok 3 sats good on RA 4 CXR is stable with low volumes  and small effus- lungs are clear on exam 5 no new labs - pending 6 sinus rhythm, will d/c wires today 7 cont pulm toilet/rehab- poss home in am  LOS: 3 days    Rowe ClackWayne E Zoeann Mol  PA-C Pager [phone] 10/18/2019

## 2019-10-18 NOTE — Progress Notes (Signed)
CARDIAC REHAB PHASE I   PRE:  Rate/Rhythm: Sinus Rhythm  BP:  Supine: 91/60  Sitting: 94/73  Standing: 103/76   SaO2: 97%  MODE:  Ambulation: 500 ft   POST:  Rate/Rhythem: Sinus Tach 109  BP:    Sitting: 101/72     SaO2: 94% RA 0930-1006 Patient ambulated in the hallway with minimal assistance. Increased distance. Patient assisted back to recliner with call bell within reach. Instructed patient to use incentive spirometer. Patient's sister present.Gladstone Lighter, RN,BSN 10/18/2019 10:13 AM  Thayer Headings RN BSN

## 2019-10-19 LAB — PROTIME-INR
INR: 1.3 — ABNORMAL HIGH (ref 0.8–1.2)
Prothrombin Time: 15.7 seconds — ABNORMAL HIGH (ref 11.4–15.2)

## 2019-10-19 MED ORDER — METOPROLOL TARTRATE 25 MG PO TABS
12.5000 mg | ORAL_TABLET | Freq: Two times a day (BID) | ORAL | 1 refills | Status: DC
Start: 2019-10-19 — End: 2019-11-17

## 2019-10-19 MED ORDER — WARFARIN SODIUM 2 MG PO TABS
4.0000 mg | ORAL_TABLET | Freq: Every day | ORAL | 1 refills | Status: DC
Start: 2019-10-19 — End: 2019-11-07

## 2019-10-19 MED ORDER — POTASSIUM CHLORIDE CRYS ER 20 MEQ PO TBCR: 40.0000 meq | EXTENDED_RELEASE_TABLET | Freq: Once | ORAL | 0 refills | Status: DC

## 2019-10-19 MED ORDER — WARFARIN SODIUM 4 MG PO TABS: 4.0000 mg | ORAL_TABLET | Freq: Every day | ORAL | Status: DC

## 2019-10-19 MED ORDER — ASPIRIN 81 MG PO TBEC: 81.0000 mg | DELAYED_RELEASE_TABLET | Freq: Every day | ORAL | Status: DC

## 2019-10-19 MED ORDER — POTASSIUM CHLORIDE CRYS ER 20 MEQ PO TBCR
40.0000 meq | EXTENDED_RELEASE_TABLET | Freq: Once | ORAL | Status: AC
  Administered 2019-10-19: 40 meq via ORAL
  Filled 2019-10-19: qty 2

## 2019-10-19 MED ORDER — FE FUMARATE-B12-VIT C-FA-IFC PO CAPS: 1.0000 | ORAL_CAPSULE | Freq: Every day | ORAL | 1 refills | Status: DC

## 2019-10-19 MED ORDER — TRAMADOL HCL 50 MG PO TABS
50.0000 mg | ORAL_TABLET | Freq: Four times a day (QID) | ORAL | 0 refills | Status: AC | PRN
Start: 2019-10-19 — End: 2019-10-26

## 2019-10-19 NOTE — Progress Notes (Signed)
Discharge instructions given to patient. IV removed, clean and intact. Medications and wound care reviewed with interpreter. All questions answered. Pt escorted home with husband.  Hazle Nordmann, RN

## 2019-10-19 NOTE — Progress Notes (Signed)
[address]       [place] [address]             [phone]      4 Days Post-Op Procedure(s) (LRB): AORTIC VALVE REPLACEMENT (AVR) WITH CARBOMEDICS SUPRA-ANNULAR (TOP HAT) 21 VALVE. (N/A) ASCENDING AORTIC ROOT ENLARGEMENT WITH PERI-GUARD PATCH. (N/A) TRANSESOPHAGEAL ECHOCARDIOGRAM (TEE) (N/A) Subjective: Feels well, no c/o , wants to go home  Objective: Vital signs in last 24 hours: Temp:  [98 F (36.7 C)-99.2 F (37.3 C)] 98.1 F (36.7 C) (09/05 0415) Pulse Rate:  [90-102] 90 (09/05 0030) Cardiac Rhythm: Sinus tachycardia (09/04 1919) Resp:  [15-20] 17 (09/05 0415) BP: (87-108)/(59-88) 88/63 (09/05 0415) SpO2:  [92 %-98 %] 98 % (09/05 0415) Weight:  [60.6 kg] 60.6 kg (09/05 0620)  Hemodynamic parameters for last 24 hours:    Intake/Output from previous day: No intake/output data recorded. Intake/Output this shift: No intake/output data recorded.  General appearance: alert, cooperative and no distress Heart: regular rate and rhythm and crisp click Lungs: mildly dim in bases Abdomen: benign Extremities: trace edema Wound: incis healing well  Lab Results: Recent Labs    10/17/19 0314 10/18/19 1228  WBC 14.3* 11.0*  HGB 8.2* 7.5*  HCT 25.2* 23.6*  PLT 149* 169   BMET:  Recent Labs    10/17/19 0314 10/18/19 1228  NA 135 137  K 4.0 3.1*  CL 105 103  CO2 23 26  GLUCOSE 116* 101*  BUN 6 7  CREATININE 0.51 0.54  CALCIUM 7.5* 7.7*    PT/INR:  Recent Labs    10/19/19 0714  LABPROT 15.7*  INR 1.3*   ABG    Component Value Date/Time   PHART 7.298 (L) 10/15/2019 1619   HCO3 20.5 10/15/2019 1619   TCO2 22 10/15/2019 1619   ACIDBASEDEF 5.0 (H) 10/15/2019 1619   O2SAT 99.0 10/15/2019 1619   CBG (last 3)  Recent Labs    10/17/19 0023 10/17/19 0447 10/17/19 0709  GLUCAP 99 114* 139*    Meds Scheduled Meds: . acetaminophen  1,000 mg Oral Q6H  . aspirin EC  81 mg Oral Daily  . bisacodyl  10 mg Oral Daily   Or  .  bisacodyl  10 mg Rectal Daily  . Chlorhexidine Gluconate Cloth  6 each Topical Daily  . [facility], Patient & Family Education   Does not apply Once  . docusate sodium  200 mg Oral Daily  . enoxaparin (LOVENOX) injection  40 mg Subcutaneous QHS  . ferrous fumarate-b12-vitamic C-folic acid  1 capsule Oral Q breakfast  . metoprolol tartrate  12.5 mg Oral BID  . pantoprazole  40 mg Oral Daily  . sodium chloride flush  10-40 mL Intracatheter Q12H  . sodium chloride flush  3 mL Intravenous Q12H  . warfarin  2.5 mg Oral q1600  . Warfarin - Physician Dosing Inpatient   Does not apply q1600   Continuous Infusions: . sodium chloride 20 mL/hr at 10/17/19 0400  . lactated ringers     PRN Meds:.acetaminophen, morphine injection, ondansetron (ZOFRAN) IV, oxyCODONE, simethicone, sodium chloride flush, sodium chloride flush, traMADol  Xrays DG Chest 2 View  Result Date: 10/18/2019 CLINICAL DATA:  Atelectasis EXAM: CHEST - 2 VIEW COMPARISON:  Yesterday FINDINGS: Low volume chest with streaky/indistinct opacity at the bases. An aortic valve is present. No edema or pneumothorax. Small pleural effusions. IMPRESSION: Continued low volume chest with atelectasis and small pleural effusions. No visible pneumothorax. Electronically Signed  By: Marnee Spring M.D.   On: 10/18/2019 07:54    Assessment/Plan: S/P Procedure(s) (LRB): AORTIC VALVE REPLACEMENT (AVR) WITH CARBOMEDICS SUPRA-ANNULAR (TOP HAT) 21 VALVE. (N/A) ASCENDING AORTIC ROOT ENLARGEMENT WITH PERI-GUARD PATCH. (N/A) TRANSESOPHAGEAL ECHOCARDIOGRAM (TEE) (N/A)   1 conts to do well POD#4 2 BP runs low relatively, tol well 3 sinus rhythm, occas tachy, cont low dose beta blockr 4 diuresing well, weight coming down, will replace K+ 5 INR 1.3, will increase coumadin to 4 mg at d/c with INR check on the 10th 6 stable for d/c this today  LOS: 4 days    Rowe Clack PA-C Pager [phone] 10/19/2019

## 2019-10-20 LAB — BPAM RBC
Blood Product Expiration Date: 202109172359
Blood Product Expiration Date: 202109192359
ISSUE DATE / TIME: 202109010934
ISSUE DATE / TIME: 202109010934
Unit Type and Rh: 7300
Unit Type and Rh: 7300

## 2019-10-20 LAB — TYPE AND SCREEN
ABO/RH(D): B POS
Antibody Screen: NEGATIVE
Unit division: 0
Unit division: 0

## 2019-10-20 NOTE — Progress Notes (Signed)
CARDIOLOGY CONSULT NOTE       Patient ID: Patricia Frederick MRN: [id number] DOB/AGE: [birth year] 30 y.o.  Referring Physician: Fayrene Helper PA-C  Primary Physician: Patient, No Pcp Per Primary Cardiologist: Eden Emms Reason for Consultation: Aortic Stenosis / Chest Pain   Active Problems:   * No active hospital problems. *  Interpretor  Used through out exam as patient does not speak english   HPI:  29 y.o. referred by [facility] ER Fayrene Helper PA- c  for chest pain and aortic stenosis . Seen in ED 06/30/19 with chest pain / dizziness r/o normal rhythm not orthostatic and d/c home Last echo 10/27/10 ? Unicuspid valve with mean gradient 49 mmHg peak 93 mmHg done when she was 5 months pregnant and patient never f/u with cardiology   She is from [place] Has a 30 yo and 84 yo child Care and lack of AVR to date related to non compliance and language barrier   Cardiac CT done 09/03/19 with calcium score 0 normal right dominant cors Aortic root 3.8 cm   Had 21 mm Sorin Carbomedics Top Hat bileaflet valve with Nicks procedure bovine pericardial patch root enlargement with Dr Cornelius Moras 10/15/19.  Did well post op. Started on coumadin 4 mg INR only 1.3 on d/c 10/19/19 D/C with coumadin, iron, lopressor 12.5 mg bid and 81 mg ASA   No issues Seeing coumadin clinic today Discussed need for baseline echo   ROS All other systems reviewed and negative except as noted above  Past Medical History:  Diagnosis Date  . Anemia   . Aortic stenosis, severe    echo 9/12: EF 55-65%, severe AS, mean gradient 49 mmHg, unicuspid Aortic Valve  . 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 Rheumatic heart disease 2011  . S/P aortic valve replacement with mechanical valve 10/15/2019   21 mm Carbomedics Top Hat bileaflet mechanical valve with aortic root enlargement  . Tachycardia     Family History  Problem Relation Age of Onset  . Stroke Neg Hx   . Cancer Neg Hx   . Asthma Other   . Anesthesia problems Neg Hx      Social History   Socioeconomic History  . Marital status: Married    Spouse name: Not on file  . Number of children: Not on file  . Years of education: Not on file  . Highest education level: Not on file  Occupational History  . Not on file  Tobacco Use  . Smoking status: Never Smoker  . Smokeless tobacco: Never Used  Vaping Use  . Vaping Use: Never used  Substance and Sexual Activity  . Alcohol use: Never  . Drug use: Never  . Sexual activity: Yes  Other Topics Concern  . Not on file  Social History Narrative   ** Merged History Encounter **       Social Determinants of Health   Financial Resource Strain:   . Difficulty of Paying Living Expenses: Not on file  Food Insecurity:   . Worried About Programme researcher, broadcasting/film/video in the Last Year: Not on file  . Ran Out of Food in the Last Year: Not on file  Transportation Needs:   . Lack of Transportation (Medical): Not on file  . Lack of Transportation (Non-Medical): Not on file  Physical Activity:   . Days of Exercise per Week: Not on file  . Minutes of Exercise per Session: Not on file  Stress:   .  Feeling of Stress : Not on file  Social Connections:   . Frequency of Communication with Friends and Family: Not on file  . Frequency of Social Gatherings with Friends and Family: Not on file  . Attends Religious Services: Not on file  . Active Member of Clubs or Organizations: Not on file  . Attends Banker Meetings: Not on file  . Marital Status: Not on file  Intimate Partner Violence:   . Fear of Current or Ex-Partner: Not on file  . Emotionally Abused: Not on file  . Physically Abused: Not on file  . Sexually Abused: Not on file    Past Surgical History:  Procedure Laterality Date  . AORTIC VALVE REPLACEMENT N/A 10/15/2019   Procedure: AORTIC VALVE REPLACEMENT (AVR) WITH CARBOMEDICS SUPRA-ANNULAR (TOP HAT) 21 VALVE.;  Surgeon: Purcell Nails, MD;  Location: [facility] OR;  Service: Open Heart Surgery;  Laterality:  N/A;  . ASCENDING AORTIC ROOT REPLACEMENT N/A 10/15/2019   Procedure: ASCENDING AORTIC ROOT ENLARGEMENT WITH PERI-GUARD PATCH.;  Surgeon: Purcell Nails, MD;  Location: [facility] OR;  Service: Open Heart Surgery;  Laterality: N/A;  . CESAREAN SECTION    . EYE SURGERY     per patient, had a mass above her eye and it was removed maybe in  2017/2018  . NO PAST SURGERIES    . TEE WITHOUT CARDIOVERSION N/A 10/15/2019   Procedure: TRANSESOPHAGEAL ECHOCARDIOGRAM (TEE);  Surgeon: Purcell Nails, MD;  Location: [facility] OR;  Service: Open Heart Surgery;  Laterality: N/A;      Current Outpatient Medications:  .  acetaminophen (TYLENOL) 500 MG tablet, Take 500 mg by mouth every 6 (six) hours as needed for mild pain. , Disp: , Rfl:  .  aspirin EC 81 MG EC tablet, Take 1 tablet (81 mg total) by mouth daily. Swallow whole., Disp: , Rfl:  .  metoprolol tartrate (LOPRESSOR) 25 MG tablet, Take 0.5 tablets (12.5 mg total) by mouth 2 (two) times daily., Disp: 30 tablet, Rfl: 1 .  traMADol (ULTRAM) 50 MG tablet, Take 1-2 tablets (50-100 mg total) by mouth every 6 (six) hours as needed for up to 7 days for moderate pain., Disp: 28 tablet, Rfl: 0 .  warfarin (COUMADIN) 2 MG tablet, Take 2 tablets (4 mg total) by mouth daily at 4 PM. As directed by coumadin clinic, Disp: 100 tablet, Rfl: 1    Physical Exam: Blood pressure 110/68, pulse 84, height 5\' 2"  (1.575 m), weight 127 lb 9.6 oz (57.9 kg), last menstrual period 09/24/2019, SpO2 98 %.    Affect appropriate Healthy:  appears stated age HEENT: normal Neck supple with no adenopathy JVP normal no bruits no thyromegaly Lungs clear with no wheezing and good diaphragmatic motion Heart:  S1/S2 click no AR   murmur, no rub, gallop or click PMI normal Abdomen: benighn, BS positve, no tenderness, no AAA no bruit.  No HSM or HJR Distal pulses intact with no bruits No edema Neuro non-focal Skin warm and dry No muscular weakness   Labs:   Lab Results  Component Value  Date   WBC 11.0 (H) 10/18/2019   HGB 7.5 (L) 10/18/2019   HCT 23.6 (L) 10/18/2019   MCV 75.6 (L) 10/18/2019   PLT 169 10/18/2019    Recent Labs  Lab 10/18/19 1228  NA 137  K 3.1*  CL 103  CO2 26  BUN 7  CREATININE 0.54  CALCIUM 7.7*  GLUCOSE 101*   No results found for: CKTOTAL, CKMB, CKMBINDEX,  TROPONINI No results found for: CHOL No results found for: HDL No results found for: LDLCALC No results found for: TRIG No results found for: CHOLHDL No results found for: LDLDIRECT    Radiology: DG Chest 2 View  Result Date: 10/18/2019 CLINICAL DATA:  Atelectasis EXAM: CHEST - 2 VIEW COMPARISON:  Yesterday FINDINGS: Low volume chest with streaky/indistinct opacity at the bases. An aortic valve is present. No edema or pneumothorax. Small pleural effusions. IMPRESSION: Continued low volume chest with atelectasis and small pleural effusions. No visible pneumothorax. Electronically Signed   By: Marnee SpringJonathon  Watts M.D.   On: 10/18/2019 07:54   DG Chest 2 View  Result Date: 10/13/2019 CLINICAL DATA:  Pre-admission for cardiac surgery 10/15/2019, aortic valve stenosis EXAM: CHEST - 2 VIEW COMPARISON:  CT 09/03/2019, radiograph 06/29/2019 FINDINGS: No consolidation, features of edema, pneumothorax, or effusion. Pulmonary vascularity is normally distributed. The cardiomediastinal contours are unremarkable. No acute osseous or soft tissue abnormality. IMPRESSION: No acute cardiopulmonary abnormality. Electronically Signed   By: Kreg ShropshirePrice  DeHay M.D.   On: 10/13/2019 23:53   DG Chest Port 1 View  Result Date: 10/17/2019 CLINICAL DATA:  Open-heart surgery.  Chest tube. EXAM: PORTABLE CHEST 1 VIEW COMPARISON:  10/16/2019. FINDINGS: Interim removal Swan-Ganz catheter. Mediastinal drainage catheter and chest tube in stable position. Cardiac valve replacement. Heart size stable. Bibasilar atelectasis with improved aeration from prior exam. Improved right pleural effusion. No pneumothorax. IMPRESSION: 1. Interim  removal Swan-Ganz catheter. Mediastinal drainage catheter chest tube in stable position. No pneumothorax. 2.  Prior cardiac valve replacement.  Heart size stable. 3. Bibasilar atelectasis with improved aeration from prior exam. Improved right pleural effusion. No evidence of CHF noted on today's exam. Electronically Signed   By: Maisie Fushomas  Register   On: 10/17/2019 07:08   DG Chest Port 1 View  Result Date: 10/16/2019 CLINICAL DATA:  Open-heart surgery.  Chest tube. EXAM: PORTABLE CHEST 1 VIEW COMPARISON:  10/15/2019. FINDINGS: Patient is rotated to the right. Swan-Ganz catheter,, mediastinal drainage catheter, chest tube in stable position. Cardiac valve replacement. Cardiomegaly. Low lung volumes. Mild bibasilar infiltrates/edema and small bilateral pleural effusions. Mild CHF could present this fashion. Gastric distention noted. IMPRESSION: 1.  Lines and tubes in stable position.  No pneumothorax. 2. Cardiomegaly. Low lung volumes. Mild bibasilar infiltrates/edema and small bilateral pleural effusions suggesting mild CHF. 3.  Gastric distention. Electronically Signed   By: Maisie Fushomas  Register   On: 10/16/2019 07:14   DG Chest Port 1 View  Result Date: 10/15/2019 CLINICAL DATA:  Status post aortic valve replacement, postoperative examination EXAM: PORTABLE CHEST 1 VIEW COMPARISON:  None. FINDINGS: Lung volumes are small. There is mild left basilar atelectasis and slight asymmetric relative left-sided volume loss. No pneumothorax or pleural effusion. Right internal jugular Swan-Ganz catheter seen with its tip within the main pulmonary artery. Mediastinal drain is in place. Left basilar chest tube extends into the retrocardiac region. Aortic valve replacement has been performed. Cardiac size within normal limits. Pulmonary vascularity is normal. No acute bone abnormality. IMPRESSION: 1. Support lines and tubes as described. 2. Mild left basilar atelectasis. 3. No pneumothorax or pleural effusion. Electronically  Signed   By: Helyn NumbersAshesh  Parikh MD   On: 10/15/2019 15:24   ECHOCARDIOGRAM COMPLETE  Result Date: 10/16/2019    ECHOCARDIOGRAM REPORT   Patient Name:   Patricia Frederick Date of Exam: 10/16/2019 Medical Rec #:  [id number]            Height:  62.0 in Accession #:    [id number]           Weight:       139.6 lb Date of Birth:  31-Oct-1990            BSA:          1.641 m Patient Age:    29 years             BP:           104/83 mmHg Patient Gender: F                    HR:           99 bpm. Exam Location:  Inpatient Procedure: 2D Echo Indications:     status post mechanical aortic valve replacement  History:         Patient has prior history of Echocardiogram examinations, most                  recent 09/09/2019.  Sonographer:     Delcie Roch Referring Phys:  [address] [place] Diagnosing Phys: Charlton Haws MD  Sonographer Comments: Suboptimal apical window. IMPRESSIONS  1. Left ventricular ejection fraction, by estimation, is 60 to 65%. The left ventricle has normal function. The left ventricle has no regional wall motion abnormalities. Left ventricular diastolic parameters were normal.  2. Right ventricular systolic function is normal. The right ventricular size is normal. There is normal pulmonary artery systolic pressure.  3. The mitral valve is normal in structure. Trivial mitral valve regurgitation. No evidence of mitral stenosis.  4. Post AVR with 21 mm TopHat bileaflet mechanical valve and Nick's proceedure aortic root enlargement Poor apical windows. No PVL mean and peak gradients 6/10 mmHg normal for valve . The aortic valve has been repaired/replaced. Aortic valve regurgitation is not visualized. No aortic stenosis is present.  5. The inferior vena cava is normal in size with greater than 50% respiratory variability, suggesting right atrial pressure of 3 mmHg. FINDINGS  Left Ventricle: Left ventricular ejection fraction, by estimation, is 60 to 65%. The left ventricle has normal function. The left  ventricle has no regional wall motion abnormalities. The left ventricular internal cavity size was normal in size. There is  no left ventricular hypertrophy. Left ventricular diastolic parameters were normal. Right Ventricle: The right ventricular size is normal. No increase in right ventricular wall thickness. Right ventricular systolic function is normal. There is normal pulmonary artery systolic pressure. The tricuspid regurgitant velocity is 1.94 m/s, and  with an assumed right atrial pressure of 0 mmHg, the estimated right ventricular systolic pressure is 15.1 mmHg. Left Atrium: Left atrial size was normal in size. Right Atrium: Right atrial size was normal in size. Pericardium: There is no evidence of pericardial effusion. Mitral Valve: The mitral valve is normal in structure. There is mild thickening of the mitral valve leaflet(s). There is mild calcification of the mitral valve leaflet(s). Normal mobility of the mitral valve leaflets. Mild mitral annular calcification. Trivial mitral valve regurgitation. No evidence of mitral valve stenosis. Tricuspid Valve: The tricuspid valve is normal in structure. Tricuspid valve regurgitation is mild . No evidence of tricuspid stenosis. Aortic Valve: Post AVR with 21 mm TopHat bileaflet mechanical valve and Nick's proceedure aortic root enlargement Poor apical windows. No PVL mean and peak gradients 6/10 mmHg normal for valve. The aortic valve has been repaired/replaced. Aortic valve regurgitation is not visualized. No aortic stenosis is present. Aortic valve  mean gradient measures 5.5 mmHg. Aortic valve peak gradient measures 9.2 mmHg. Aortic valve area, by VTI measures 1.07 cm. Pulmonic Valve: The pulmonic valve was normal in structure. Pulmonic valve regurgitation is not visualized. No evidence of pulmonic stenosis. Aorta: The aortic root is normal in size and structure. Venous: The inferior vena cava is normal in size with greater than 50% respiratory variability,  suggesting right atrial pressure of 3 mmHg. IAS/Shunts: No atrial level shunt detected by color flow Doppler.  LEFT VENTRICLE PLAX 2D LVIDd:         3.00 cm  Diastology LVIDs:         2.20 cm  LV e' lateral:   8.27 cm/s LV PW:         1.00 cm  LV E/e' lateral: 12.7 LV IVS:        0.90 cm  LV e' medial:    7.40 cm/s LVOT diam:     1.30 cm  LV E/e' medial:  14.2 LV SV:         21 LV SV Index:   13 LVOT Area:     1.33 cm  LEFT ATRIUM           Index       RIGHT ATRIUM           Index LA diam:      2.50 cm 1.52 cm/m  RA Area:     11.00 cm LA Vol (A4C): 20.0 ml 12.19 ml/m RA Volume:   21.70 ml  13.23 ml/m  AORTIC VALVE AV Area (Vmax):    0.78 cm AV Area (Vmean):   0.89 cm AV Area (VTI):     1.07 cm AV Vmax:           152.00 cm/s AV Vmean:          105.600 cm/s AV VTI:            0.198 m AV Peak Grad:      9.2 mmHg AV Mean Grad:      5.5 mmHg LVOT Vmax:         89.45 cm/s LVOT Vmean:        71.050 cm/s LVOT VTI:          0.160 m LVOT/AV VTI ratio: 0.81 MITRAL VALVE                TRICUSPID VALVE MV Area (PHT): 5.88 cm     TR Peak grad:   15.1 mmHg MV Decel Time: 129 msec     TR Vmax:        194.00 cm/s MV E velocity: 105.00 cm/s MV A velocity: 49.70 cm/s   SHUNTS MV E/A ratio:  2.11         Systemic VTI:  0.16 m                             Systemic Diam: 1.30 cm Charlton Haws MD Electronically signed by Charlton Haws MD Signature Date/Time: 10/16/2019/5:38:58 PM    Final    ECHO INTRAOPERATIVE TEE  Result Date: 10/15/2019  *INTRAOPERATIVE TRANSESOPHAGEAL REPORT *  Patient Name:   Patricia Frederick Date of Exam: 10/15/2019 Medical Rec #:  [id number]            Height:       62.0 in Accession #:    [id number]           Weight:  125.7 lb Date of Birth:  [birth year]            BSA:          1.57 m Patient Age:    29 years             BP:           114/84 mmHg Patient Gender: F                    HR:           78 bpm. Exam Location:  Anesthesiology Transesophogeal exam was perform intraoperatively during surgical  procedure. Patient was closely monitored under general anesthesia during the entirety of examination. Indications:     aortic stenosis I35.0 Performing Phys: [id number] CLARENCE H OWEN Complications: No known complications during this procedure. POST-OP IMPRESSIONS - Left Ventricle: The left ventricle is unchanged from pre-bypass. - Aorta: The aorta appears unchanged from pre-bypass. - Aortic Valve: A bileaflet mechanical valve was placed, leaflets are freely mobile Size; 21mm. No regurgitation post repair. Normal washing jets for valve type. No perivalvular leak noted.Mean gradient from 6-19mmHg. - Mitral Valve: The mitral valve appears unchanged from pre-bypass. - Tricuspid Valve: The tricuspid valve appears unchanged from pre-bypass. PRE-OP FINDINGS  Left Ventricle: The left ventricle has normal systolic function, with an ejection fraction of 60-65%. The cavity size was normal. There is mildly increased left ventricular wall thickness. Right Ventricle: The right ventricle has normal systolic function. The cavity was normal. There is no increase in right ventricular wall thickness. Left Atrium: Left atrial size was normal in size. The left atrial appendage is well visualized and there is no evidence of thrombus present. Right Atrium: Right atrial size was normal in size. Right atrial pressure is estimated at 10 mmHg. Interatrial Septum: No atrial level shunt detected by color flow Doppler. Pericardium: There is no evidence of pericardial effusion. Mitral Valve: The mitral valve is normal in structure. No thickening of the mitral valve leaflet. Mitral valve regurgitation is trivial by color flow Doppler. There is no evidence of mitral valve vegetation. Tricuspid Valve: The tricuspid valve was normal in structure. Tricuspid valve regurgitation is mild by color flow Doppler. There is no evidence of tricuspid valve vegetation. Aortic Valve: The aortic valve is bicuspid There is mild thickening of the aortic valve Aortic  valve regurgitation is trivial by color flow Doppler. There is no evidence of a vegetation on the aortic valve. Pulmonic Valve: The pulmonic valve was normal in structure. Pulmonic valve regurgitation is trivial by color flow Doppler. +-------------+---------++ AORTIC VALVE           +-------------+---------++ AV Mean Grad:23.7 mmHg +-------------+---------++  [name] MD Electronically signed by [name] MD Signature Date/Time: 10/15/2019/3:20:15 PM    Final    VAS US DOPPLER PRE CABG  Result Date: 10/13/2019 PREOPERATIVE VASCULAR EVALUATION  Indications:      Pre-AVR Other Factors:    Aortic valve disease. Limitations:      Severe aortic stenosis affecting waveforms Comparison Study: No prior study on file Performing Technologist: Sherren Kerns RVS  Examination Guidelines: A complete evaluation includes B-mode imaging, spectral Doppler, color Doppler, and power Doppler as needed of all accessible portions of each vessel. Bilateral testing is considered an integral part of a complete examination. Limited examinations for reoccurring indications may be performed as noted.  Right Carotid Findings: +----------+--------+--------+--------+--------+--------+           PSV cm/sEDV cm/sStenosisDescribeComments +----------+--------+--------+--------+--------+--------+ CCA Prox  73      29                               +----------+--------+--------+--------+--------+--------+ CCA Distal62      26                               +----------+--------+--------+--------+--------+--------+ ICA Prox  68      33                               +----------+--------+--------+--------+--------+--------+ ICA Distal73      41                               +----------+--------+--------+--------+--------+--------+ ECA       65      24                               +----------+--------+--------+--------+--------+--------+ Portions of this table do not appear on this page.  +----------+--------+-------+--------+------------+           PSV cm/sEDV cmsDescribeArm Pressure +----------+--------+-------+--------+------------+ Subclavian64                                  +----------+--------+-------+--------+------------+ +---------+--------+--+--------+--+ VertebralPSV cm/s38EDV cm/s15 +---------+--------+--+--------+--+ Left Carotid Findings: +----------+--------+--------+--------+--------+--------+           PSV cm/sEDV cm/sStenosisDescribeComments +----------+--------+--------+--------+--------+--------+ CCA Prox  73      31                               +----------+--------+--------+--------+--------+--------+ CCA Distal68      38                               +----------+--------+--------+--------+--------+--------+ ICA Prox  51      22                               +----------+--------+--------+--------+--------+--------+ ICA Distal62      25                               +----------+--------+--------+--------+--------+--------+ ECA       48      19                               +----------+--------+--------+--------+--------+--------+ +----------+--------+--------+--------+------------+ SubclavianPSV cm/sEDV cm/sDescribeArm Pressure +----------+--------+--------+--------+------------+           63                                   +----------+--------+--------+--------+------------+ +---------+--------+--+--------+--+ VertebralPSV cm/s38EDV cm/s20 +---------+--------+--+--------+--+  Summary: Right Carotid: There was no evidence of thrombus, dissection, atherosclerotic                plaque or stenosis in the cervical carotid system. Left Carotid: There was no evidence of thrombus, dissection, atherosclerotic  plaque or stenosis in the cervical carotid system. Vertebrals:  Bilateral vertebral arteries demonstrate antegrade flow. Subclavians: Normal flow hemodynamics were seen in bilateral  subclavian              arteries.  Electronically signed by Fabienne Bruns MD on 10/13/2019 at 4:32:48 PM.    Final     EKG: SR rate 72 normal 06/30/19    ASSESSMENT AND PLAN:   1. AVR:  See HPI no AR on exam wounds healing well needs f/u INR  And regular coumadin clinic f/u. ECG with no heart block or arrhythmias. Will order post op echo for baseline gradients in October. Pre AVR no CAD by cardiac CT and no carotid disease by duplex. F/U with Dr Cornelius Moras CVTS   F/U cardiology 3 months unless post op echo abnormal    Signed: Charlton Haws 10/24/2019, 8:10 AM

## 2019-10-21 MED FILL — Electrolyte-R (PH 7.4) Solution: INTRAVENOUS | Qty: 8000 | Status: AC

## 2019-10-21 MED FILL — Albumin, Human Inj 5%: INTRAVENOUS | Qty: 250 | Status: AC

## 2019-10-21 MED FILL — Mannitol IV Soln 20%: INTRAVENOUS | Qty: 500 | Status: AC

## 2019-10-21 MED FILL — Calcium Chloride Inj 10%: INTRAVENOUS | Qty: 10 | Status: AC

## 2019-10-21 MED FILL — Sodium Bicarbonate IV Soln 8.4%: INTRAVENOUS | Qty: 50 | Status: AC

## 2019-10-21 MED FILL — Heparin Sodium (Porcine) Inj 1000 Unit/ML: INTRAMUSCULAR | Qty: 10 | Status: AC

## 2019-10-21 MED FILL — Sodium Chloride IV Soln 0.9%: INTRAVENOUS | Qty: 3000 | Status: AC

## 2019-10-24 ENCOUNTER — Ambulatory Visit (INDEPENDENT_AMBULATORY_CARE_PROVIDER_SITE_OTHER): Payer: BC Managed Care – PPO | Admitting: Cardiovascular Disease

## 2019-10-24 ENCOUNTER — Other Ambulatory Visit: Payer: Self-pay

## 2019-10-24 ENCOUNTER — Encounter: Payer: Self-pay | Admitting: Cardiovascular Disease

## 2019-10-24 ENCOUNTER — Ambulatory Visit (INDEPENDENT_AMBULATORY_CARE_PROVIDER_SITE_OTHER): Payer: BC Managed Care – PPO | Admitting: *Deleted

## 2019-10-24 VITALS — BP 110/68 | HR 84 | Ht 62.0 in | Wt 127.6 lb

## 2019-10-24 DIAGNOSIS — Z5181 Encounter for therapeutic drug level monitoring: Secondary | ICD-10-CM

## 2019-10-24 DIAGNOSIS — Z7901 Long term (current) use of anticoagulants: Secondary | ICD-10-CM | POA: Insufficient documentation

## 2019-10-24 DIAGNOSIS — Z952 Presence of prosthetic heart valve: Secondary | ICD-10-CM | POA: Diagnosis not present

## 2019-10-24 LAB — POCT INR: INR: 1.8 — AB (ref 2.0–3.0)

## 2019-10-24 NOTE — Patient Instructions (Addendum)
Description   Continue to take 2 tablets daily, Recheck INR in 1 week. Call coumadin clinic for any changes in medications or upcoming procedures. [phone].   A full discussion of the nature of anticoagulants has been carried out.  A benefit risk analysis has been presented to the patient, so that they understand the justification for choosing anticoagulation at this time. The need for frequent and regular monitoring, precise dosage adjustment and compliance is stressed.  Side effects of potential bleeding are discussed.  The patient should avoid any OTC items containing aspirin or ibuprofen, and should avoid great swings in general diet.  Avoid alcohol consumption.  Call if any signs of abnormal bleeding.

## 2019-10-24 NOTE — Patient Instructions (Signed)

## 2019-10-24 NOTE — Progress Notes (Signed)
Interpretor present for Visit.

## 2019-10-30 ENCOUNTER — Telehealth (HOSPITAL_COMMUNITY): Payer: Self-pay

## 2019-10-30 NOTE — Telephone Encounter (Signed)
Pt insurance is active and benefits verified through [employer]. Co-pay $0.00, DED $2,000.00/$2,000.00 met, out of pocket $4,000.00/$3,186.34 met, co-insurance 10%. No pre-authorization required. [employer]port, 10/30/19 @ 2:28PM, [id number]  Will contact patient to see if she is interested in the Cardiac Rehab Program. If interested, patient will need to complete follow up appt. Once completed, patient will be contacted for scheduling upon review by the RN Navigator.

## 2019-10-31 ENCOUNTER — Other Ambulatory Visit: Payer: Self-pay

## 2019-10-31 ENCOUNTER — Ambulatory Visit (INDEPENDENT_AMBULATORY_CARE_PROVIDER_SITE_OTHER): Payer: BC Managed Care – PPO | Admitting: *Deleted

## 2019-10-31 DIAGNOSIS — Z952 Presence of prosthetic heart valve: Secondary | ICD-10-CM

## 2019-10-31 DIAGNOSIS — Z5181 Encounter for therapeutic drug level monitoring: Secondary | ICD-10-CM | POA: Diagnosis not present

## 2019-10-31 LAB — POCT INR: INR: 1.2 — AB (ref 2.0–3.0)

## 2019-10-31 NOTE — Patient Instructions (Signed)
Description   Take 3 tablets today and tomorrow, then start taking 2 tablets daily except for 3 tablets on Mondays and Fridays. Coumadin Clinic [phone].

## 2019-11-03 ENCOUNTER — Ambulatory Visit: Payer: BC Managed Care – PPO | Admitting: Nurse Practitioner

## 2019-11-07 ENCOUNTER — Ambulatory Visit (INDEPENDENT_AMBULATORY_CARE_PROVIDER_SITE_OTHER): Payer: BC Managed Care – PPO | Admitting: *Deleted

## 2019-11-07 ENCOUNTER — Other Ambulatory Visit: Payer: Self-pay

## 2019-11-07 DIAGNOSIS — Z5181 Encounter for therapeutic drug level monitoring: Secondary | ICD-10-CM | POA: Diagnosis not present

## 2019-11-07 DIAGNOSIS — Z952 Presence of prosthetic heart valve: Secondary | ICD-10-CM

## 2019-11-07 LAB — POCT INR: INR: 1.4 — AB (ref 2.0–3.0)

## 2019-11-07 MED ORDER — WARFARIN SODIUM 2 MG PO TABS: ORAL_TABLET | ORAL | 0 refills | Status: DC

## 2019-11-07 NOTE — Patient Instructions (Signed)
Description   Take 4 tablets today and then start taking 3 tablets daily except for 2 tablets on Tuesday and Thursdays. Recheck INR in 1 week.  Coumadin Clinic [phone].

## 2019-11-14 ENCOUNTER — Other Ambulatory Visit: Payer: Self-pay | Admitting: Thoracic Surgery (Cardiothoracic Vascular Surgery)

## 2019-11-14 ENCOUNTER — Other Ambulatory Visit: Payer: Self-pay

## 2019-11-14 ENCOUNTER — Ambulatory Visit (INDEPENDENT_AMBULATORY_CARE_PROVIDER_SITE_OTHER): Payer: BC Managed Care – PPO | Admitting: Pharmacist

## 2019-11-14 DIAGNOSIS — Z954 Presence of other heart-valve replacement: Secondary | ICD-10-CM | POA: Diagnosis not present

## 2019-11-14 DIAGNOSIS — Z952 Presence of prosthetic heart valve: Secondary | ICD-10-CM

## 2019-11-14 DIAGNOSIS — Z7901 Long term (current) use of anticoagulants: Secondary | ICD-10-CM

## 2019-11-14 DIAGNOSIS — Z5181 Encounter for therapeutic drug level monitoring: Secondary | ICD-10-CM

## 2019-11-14 LAB — POCT INR: INR: 1.8 — AB (ref 2.0–3.0)

## 2019-11-14 NOTE — Patient Instructions (Signed)
Description   Take 4 tablets today and then start taking 3 tablets daily except for 2 tablets on Thursday. Recheck INR in 1 week.  Coumadin Clinic [phone].

## 2019-11-17 ENCOUNTER — Ambulatory Visit (INDEPENDENT_AMBULATORY_CARE_PROVIDER_SITE_OTHER): Payer: Self-pay | Admitting: Thoracic Surgery (Cardiothoracic Vascular Surgery)

## 2019-11-17 ENCOUNTER — Other Ambulatory Visit: Payer: Self-pay | Admitting: Thoracic Surgery (Cardiothoracic Vascular Surgery)

## 2019-11-17 ENCOUNTER — Other Ambulatory Visit: Payer: Self-pay

## 2019-11-17 ENCOUNTER — Ambulatory Visit
Admission: RE | Admit: 2019-11-17 | Discharge: 2019-11-17 | Disposition: A | Discharge status: Home / Self Care | Payer: BC Managed Care – PPO | Source: Ambulatory Visit | Attending: Thoracic Surgery (Cardiothoracic Vascular Surgery) | Admitting: Thoracic Surgery (Cardiothoracic Vascular Surgery)

## 2019-11-17 ENCOUNTER — Encounter: Payer: Self-pay | Admitting: Thoracic Surgery (Cardiothoracic Vascular Surgery)

## 2019-11-17 VITALS — BP 89/64 | HR 65 | Temp 98.2°F | Resp 20 | Ht 62.0 in | Wt 128.0 lb

## 2019-11-17 DIAGNOSIS — Z952 Presence of prosthetic heart valve: Secondary | ICD-10-CM | POA: Diagnosis not present

## 2019-11-17 DIAGNOSIS — Z954 Presence of other heart-valve replacement: Secondary | ICD-10-CM

## 2019-11-17 NOTE — Progress Notes (Signed)
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 Her early postoperative recovery was uneventful and she was discharged home on the fourth postoperative day. Follow-up echocardiogram performed while she remained in the hospital revealed normal left ventricular function with normal functioning bileaflet mechanical valve in the aortic position. There was no paravalvular leak and mean transvalvular gradient was estimated 6 mmHg. Shortly after that she was seen in follow-up by Dr. Eden Emms in the office and her prothrombin time has been monitored through the anticoagulation clinic at [facility]. Patient returns her office today for routine follow-up. She is accompanied by an interpreter for her visit. Patient states that she is doing well. She has mild residual soreness in her chest. She is not using any sort of pain relievers. Appetite is good. She is sleeping well at night. She denies shortness of breath. She has had a scant amount of drainage from her chest tube incision sites. She is taking her Coumadin. She has felt a little bit dizzy intermittently, but she states that this is chronic.   Current Outpatient Medications  Medication Sig Dispense Refill  . acetaminophen (TYLENOL) 500 MG tablet Take 500 mg by mouth every 6 (six) hours as needed for mild pain.     Marland Kitchen aspirin EC 81 MG EC tablet Take 1 tablet (81 mg total) by mouth daily. Swallow whole.    . metoprolol tartrate (LOPRESSOR) 25 MG tablet Take 0.5 tablets (12.5 mg total) by mouth 2 (two) times daily. 30 tablet 1  .  warfarin (COUMADIN) 2 MG tablet Take 2-3 tablets daily as directed by the coumadin clinic. 100 tablet 0   No current facility-administered medications for this visit.      Physical Exam:   BP (!) 89/64 (BP Location: Right Arm, Patient Position: Sitting, Cuff Size: Normal)   Pulse 65   Temp 98.2 F (36.8 C)   Resp 20   Ht 5\' 2"  (1.575 m)   Wt 128 lb (58.1 kg)   SpO2 100% Comment: RA  BMI 23.41 kg/m   General:  Well-appearing  Chest:   Clear to auscultation with symmetrical breath sounds  CV:   Regular rate and rhythm with mechanical heart valve sounds  Incisions:  Clean and dry healing nicely, sternum is stable  Abdomen:  Soft nontender  Extremities:  Warm and well-perfused  Diagnostic Tests:  CHEST - 2 VIEW  COMPARISON:  October 18, 2019  FINDINGS: Lungs are clear. Heart is upper normal in size with pulmonary vascularity normal. Patient is status post aortic valve replacement. No adenopathy. No pneumothorax. No bone lesions.  IMPRESSION: Status post aortic valve replacement. Heart upper normal in size. Lungs clear.   Electronically Signed   By: October 20, 2019 III M.D.   On: 11/17/2019 15:23   Impression:  Patient is doing well approximately 4 weeks status post aortic valve replacement using bileaflet mechanical prosthetic valve. Blood pressure is a little bit soft in our office today and runs relatively low at baseline. She is complaining  of some intermittent dizziness.  Plan:  I have instructed the patient to stop taking metoprolol. We will send her for limited follow-up echocardiogram to make sure she does not have a pericardial effusion. I have encouraged the patient to continue to gradually increase her physical activity but I have reminded her to refrain from heavy lifting or strenuous use of her arms or shoulders for at least another 2 months. Patient will return to our office for routine follow-up in approximately 2 months. All questions  answered.    Salvatore Decent. Cornelius Moras, MD 11/17/2019 3:13 PM

## 2019-11-17 NOTE — Patient Instructions (Addendum)
Stop taking metoprolol.  Continue all other previous medications without any changes at this time  Go to [facility] tomorrow for echocardiogram  Continue to avoid any heavy lifting or strenuous use of your arms or shoulders for at least a total of three months from the time of surgery.  After three months you may gradually increase how much you lift or otherwise use your arms or chest as tolerated, with limits based upon whether or not activities lead to the return of significant discomfort.  You are encouraged to enroll and participate in the outpatient cardiac rehab program beginning as soon as practical.  Endocarditis is a potentially serious infection of heart valves or inside lining of the heart.  It occurs more commonly in patients with diseased heart valves (such as patient's with aortic or mitral valve disease) and in patients who have undergone heart valve repair or replacement.  Certain surgical and dental procedures may put you at risk, such as dental cleaning, other dental procedures, or any surgery involving the respiratory, urinary, gastrointestinal tract, gallbladder or prostate gland.   To minimize your chances for develooping endocarditis, maintain good oral health and seek prompt medical attention for any infections involving the mouth, teeth, gums, skin or urinary tract.    Always notify your doctor or dentist about your underlying heart valve condition before having any invasive procedures. You will need to take antibiotics before certain procedures, including all routine dental cleanings or other dental procedures.  Your cardiologist or dentist should prescribe these antibiotics for you to be taken ahead of time.

## 2019-11-18 ENCOUNTER — Ambulatory Visit (HOSPITAL_COMMUNITY)
Admission: RE | Admit: 2019-11-18 | Discharge: 2019-11-18 | Disposition: A | Discharge status: Home / Self Care | Payer: BC Managed Care – PPO | Source: Ambulatory Visit | Attending: Thoracic Surgery (Cardiothoracic Vascular Surgery) | Admitting: Thoracic Surgery (Cardiothoracic Vascular Surgery)

## 2019-11-18 DIAGNOSIS — Z8679 Personal history of other diseases of the circulatory system: Secondary | ICD-10-CM | POA: Insufficient documentation

## 2019-11-18 DIAGNOSIS — Z952 Presence of prosthetic heart valve: Secondary | ICD-10-CM | POA: Diagnosis not present

## 2019-11-18 LAB — ECHOCARDIOGRAM LIMITED
AR max vel: 1.58 cm2
AV Area VTI: 1.61 cm2
AV Area mean vel: 1.44 cm2
AV Mean grad: 9 mmHg
AV Peak grad: 15.7 mmHg
Ao pk vel: 1.98 m/s
P 1/2 time: 531 msec
S' Lateral: 1.7 cm

## 2019-11-18 NOTE — Progress Notes (Signed)
*  PRELIMINARY RESULTS* Echocardiogram 2D Echocardiogram has been performed.  Pieter Partridge 11/18/2019, 3:52 PM

## 2019-11-21 ENCOUNTER — Other Ambulatory Visit: Payer: Self-pay

## 2019-11-21 ENCOUNTER — Ambulatory Visit (INDEPENDENT_AMBULATORY_CARE_PROVIDER_SITE_OTHER): Payer: BC Managed Care – PPO | Admitting: Pharmacist

## 2019-11-21 DIAGNOSIS — Z954 Presence of other heart-valve replacement: Secondary | ICD-10-CM

## 2019-11-21 DIAGNOSIS — Z5181 Encounter for therapeutic drug level monitoring: Secondary | ICD-10-CM

## 2019-11-21 DIAGNOSIS — Z7901 Long term (current) use of anticoagulants: Secondary | ICD-10-CM

## 2019-11-21 DIAGNOSIS — Z952 Presence of prosthetic heart valve: Secondary | ICD-10-CM

## 2019-11-21 LAB — POCT INR: INR: 1.5 — AB (ref 2.0–3.0)

## 2019-11-21 NOTE — Patient Instructions (Signed)
Description   Take 4 tablets today and 4 tablets tomorrow then begin taking 3 tablets daily.  Recheck INR in 1 week.  Coumadin Clinic [phone].

## 2019-11-25 ENCOUNTER — Encounter (HOSPITAL_COMMUNITY): Payer: Self-pay

## 2019-11-25 NOTE — Progress Notes (Signed)
Cardiac Rehab Note:  Clinical review of pt follow up appt on 11/17/19 with Dr. Cornelius Moras -  office notes reviewed. Pt is making the expected progress in recovery. BP soft and metoprolol discontinued. Limited echo negative for pericardiac effusion.  Pt appropriate for scheduling for on site cardiac rehab and/or enrollment in Virtual Cardiac Rehab.  Pt Covid Risk Score is 0.  Will forward to staff for follow up.   Kesa Birky E. Suzie Portela RN, BSN [place]. [facility]  Cardiac and Pulmonary Rehabilitation Phone: [phone] Fax: [phone]

## 2019-11-28 ENCOUNTER — Ambulatory Visit (INDEPENDENT_AMBULATORY_CARE_PROVIDER_SITE_OTHER): Payer: BC Managed Care – PPO | Admitting: *Deleted

## 2019-11-28 ENCOUNTER — Other Ambulatory Visit: Payer: Self-pay

## 2019-11-28 DIAGNOSIS — Z954 Presence of other heart-valve replacement: Secondary | ICD-10-CM | POA: Diagnosis not present

## 2019-11-28 DIAGNOSIS — Z952 Presence of prosthetic heart valve: Secondary | ICD-10-CM | POA: Diagnosis not present

## 2019-11-28 DIAGNOSIS — Z7901 Long term (current) use of anticoagulants: Secondary | ICD-10-CM

## 2019-11-28 DIAGNOSIS — Z5181 Encounter for therapeutic drug level monitoring: Secondary | ICD-10-CM | POA: Diagnosis not present

## 2019-11-28 LAB — POCT INR: INR: 2.1 (ref 2.0–3.0)

## 2019-11-28 NOTE — Patient Instructions (Addendum)
Description   Start taking 3 tablets daily except 4 tablets on Mondays.  Recheck INR in 1 week.  Coumadin Clinic [phone].

## 2019-12-05 ENCOUNTER — Ambulatory Visit (INDEPENDENT_AMBULATORY_CARE_PROVIDER_SITE_OTHER): Payer: BC Managed Care – PPO | Admitting: Pharmacist

## 2019-12-05 ENCOUNTER — Other Ambulatory Visit: Payer: Self-pay

## 2019-12-05 DIAGNOSIS — Z952 Presence of prosthetic heart valve: Secondary | ICD-10-CM

## 2019-12-05 DIAGNOSIS — Z954 Presence of other heart-valve replacement: Secondary | ICD-10-CM

## 2019-12-05 DIAGNOSIS — Z5181 Encounter for therapeutic drug level monitoring: Secondary | ICD-10-CM

## 2019-12-05 DIAGNOSIS — Z7901 Long term (current) use of anticoagulants: Secondary | ICD-10-CM | POA: Diagnosis not present

## 2019-12-05 LAB — POCT INR: INR: 2.3 (ref 2.0–3.0)

## 2019-12-05 NOTE — Patient Instructions (Addendum)
Description   Called pt and advised to start taking 3 tablets daily except 4 tablets on Mondays. Recheck INR in 2 weeks.  Coumadin Clinic [phone].

## 2019-12-09 ENCOUNTER — Ambulatory Visit (HOSPITAL_COMMUNITY): Payer: BC Managed Care – PPO | Attending: Cardiovascular Disease

## 2019-12-09 ENCOUNTER — Other Ambulatory Visit (HOSPITAL_COMMUNITY): Payer: Self-pay | Admitting: Thoracic Surgery (Cardiothoracic Vascular Surgery)

## 2019-12-09 ENCOUNTER — Other Ambulatory Visit: Payer: Self-pay

## 2019-12-09 DIAGNOSIS — Z952 Presence of prosthetic heart valve: Secondary | ICD-10-CM | POA: Insufficient documentation

## 2019-12-09 DIAGNOSIS — I313 Pericardial effusion (noninflammatory): Secondary | ICD-10-CM | POA: Diagnosis not present

## 2019-12-09 DIAGNOSIS — I3139 Other pericardial effusion (noninflammatory): Secondary | ICD-10-CM

## 2019-12-09 LAB — ECHOCARDIOGRAM COMPLETE
AV Mean grad: 11.8 mmHg
AV Peak grad: 17.6 mmHg
Ao pk vel: 2.1 m/s
Area-P 1/2: 4.89 cm2
P 1/2 time: 463 msec
S' Lateral: 2.5 cm

## 2019-12-10 ENCOUNTER — Other Ambulatory Visit: Payer: Self-pay | Admitting: Surgical

## 2019-12-13 ENCOUNTER — Telehealth (HOSPITAL_COMMUNITY): Payer: Self-pay | Admitting: Student-PharmD

## 2019-12-13 ENCOUNTER — Other Ambulatory Visit: Payer: Self-pay | Admitting: Cardiovascular Disease

## 2019-12-13 NOTE — Telephone Encounter (Signed)
Cardiac Rehab Medication Review by a Pharmacist  Does the patient  feel that his/her medications are working for him/her?  yes  Has the patient been experiencing any side effects to the medications prescribed?  no  Does the patient measure his/her own blood pressure or blood glucose at home?  no   Does the patient have any problems obtaining medications due to transportation or finances?   no  Understanding of regimen: good Understanding of indications: good Potential of compliance: good  I used the help of an interpreter to translate to Nepali for this medication review.   Yvetta Coder, PharmD PGY1 Acute Care Pharmacy Resident Please refer to [facility] for unit-specific pharmacist

## 2019-12-17 ENCOUNTER — Other Ambulatory Visit: Payer: Self-pay

## 2019-12-17 ENCOUNTER — Encounter (HOSPITAL_COMMUNITY)
Admission: RE | Admit: 2019-12-17 | Discharge: 2019-12-17 | Disposition: A | Discharge status: Home / Self Care | Payer: BC Managed Care – PPO | Source: Ambulatory Visit | Attending: Cardiology | Admitting: Cardiology

## 2019-12-17 DIAGNOSIS — Z954 Presence of other heart-valve replacement: Secondary | ICD-10-CM | POA: Insufficient documentation

## 2019-12-17 NOTE — Progress Notes (Signed)
Cardiac Rehab Telephone Note:  Successful telephone encounter to [name] utilizing the language line for Nepali to confirm Cardiac Rehab orientation appointment for 12/17/19 at 1:30. Nursing assessment completed. Patient questions answered. Instructions for appointment provided. Patient screening for Covid-19 negative.  Patricia Frederick E. Suzie Portela RN, BSN [place]. [facility]  Cardiac and Pulmonary Rehabilitation Phone: [phone] Fax: [phone]

## 2019-12-18 ENCOUNTER — Encounter (HOSPITAL_COMMUNITY)
Admission: RE | Admit: 2019-12-18 | Discharge: 2019-12-18 | Disposition: A | Discharge status: Home / Self Care | Payer: BC Managed Care – PPO | Source: Ambulatory Visit | Attending: Cardiovascular Disease | Admitting: Cardiovascular Disease

## 2019-12-18 ENCOUNTER — Encounter (HOSPITAL_COMMUNITY): Payer: Self-pay

## 2019-12-18 VITALS — BP 98/78 | Ht 58.25 in | Wt 128.3 lb

## 2019-12-18 DIAGNOSIS — Z954 Presence of other heart-valve replacement: Secondary | ICD-10-CM

## 2019-12-18 NOTE — Progress Notes (Signed)
Cardiac Individual Treatment Plan  Patient Details  Name: Patricia Frederick MRN: [id number] Date of Birth: February 16, 1990 Referring Provider:     CARDIAC REHAB PHASE II ORIENTATION [facility] 12[facility]18/2019 [facility]  Referring Provider Charlton HawsPeter Nishan, MD      Initial Encounter Date:    CARDIAC REHAB PHASE II ORIENTATION [facility]  Date 12/18/19      Visit Diagnosis: S/P aortic valve replacement with mechanical valve 10/15/19  Patient's Home Medications on Admission:  Current Outpatient Medications:  .  warfarin (COUMADIN) 2 MG tablet, TAKE 2 TO 3 TABLETS BY MOUTH EVERY DAY AS DIRECTED BY COUMADIN CLINIC, Disp: 100 tablet, Rfl: 1 .  acetaminophen (TYLENOL) 500 MG tablet, Take 500 mg by mouth every 6 (six) hours as needed for mild pain.  (Patient not taking: Reported on 12/18/2019), Disp: , Rfl:  .  aspirin EC 81 MG EC tablet, Take 1 tablet (81 mg total) by mouth daily. Swallow whole. (Patient not taking: Reported on 12/13/2019), Disp: , Rfl:  .  FeFum-FePoly-FA-B Cmp-C-Biot (INTEGRA PLUS) CAPS, Take 1 capsule by mouth every morning. (Patient not taking: Reported on 12/18/2019), Disp: , Rfl:  .  metoprolol tartrate (LOPRESSOR) 25 MG tablet, Take 12.5 mg by mouth 2 (two) times daily. (Patient not taking: Reported on 12/17/2019), Disp: , Rfl:   Past Medical History: Past Medical History:  Diagnosis Date  . Anemia   . Aortic stenosis, severe    echo 9/12: EF 55-65%, severe AS, mean gradient 49 mmHg, unicuspid Aortic Valve  . 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 Rheumatic heart disease 2011  . S/P aortic valve replacement with mechanical valve 10/15/2019   21 mm Carbomedics Top Hat bileaflet mechanical valve with aortic root enlargement  . Tachycardia     Tobacco Use: Social History   Tobacco Use  Smoking Status Never Smoker  Smokeless Tobacco Never Used    Labs: Recent Review Flowsheet Data     Labs for ITP Cardiac and Pulmonary Rehab Latest Ref Rng & Units 10/15/2019 10/15/2019 10/15/2019 10/15/2019 10/15/2019   Hemoglobin A1c 4.8 - 5.6 % - - - - -   PHART 7.35 - 7.45 - 7.397 - 7.262(L) 7.298(L)   PCO2ART 32 - 48 mmHg - 35.6 - 46.0 42.3   HCO3 20.0 - 28.0 mmol/L - 21.9 - 20.8 20.5   TCO2 22 - 32 mmol/L 22 23 22 22 22    ACIDBASEDEF 0.0 - 2.0 mmol/L - 3.0(H) - 6.0(H) 5.0(H)   O2SAT % - 100.0 - 99.0 99.0      Capillary Blood Glucose: Lab Results  Component Value Date   GLUCAP 139 (H) 10/17/2019   GLUCAP 114 (H) 10/17/2019   GLUCAP 99 10/17/2019   GLUCAP 111 (H) 10/16/2019   GLUCAP 117 (H) 10/16/2019     Exercise Target Goals: Exercise Program Goal: Individual exercise prescription set using results from initial 6 min walk test and THRR while considering  patient's activity barriers and safety.   Exercise Prescription Goal: Starting with aerobic activity 30 plus minutes a day, 3 days per week for initial exercise prescription. Provide home exercise prescription and guidelines that participant acknowledges understanding prior to discharge.  Activity Barriers & Risk Stratification:  Activity Barriers & Cardiac Risk Stratification - 12/18/19 1526      Activity Barriers & Cardiac Risk Stratification   Activity Barriers None    Cardiac Risk Stratification High  6 Minute Walk:  6 Minute Walk    Row Name 12/18/19 1524         6 Minute Walk   Phase Initial     Distance 1227 feet     Walk Time 6 minutes     # of Rest Breaks 0     MPH 2.3     METS 4.76     RPE 7     Perceived Dyspnea  0     VO2 Peak 16.67     Symptoms No     Resting HR 98 bpm     Resting BP 98/78     Resting Oxygen Saturation  100 %     Exercise Oxygen Saturation  during 6 min walk 99 %     Max Ex. HR 111 bpm     Max Ex. BP 112/70     2 Minute Post BP 104/60            Oxygen Initial Assessment:   Oxygen Re-Evaluation:   Oxygen Discharge (Final Oxygen Re-Evaluation):   Initial  Exercise Prescription:  Initial Exercise Prescription - 12/18/19 1500      Date of Initial Exercise RX and Referring Provider   Date 12/18/19    Referring Provider Charlton Haws, MD    Expected Discharge Date 02/11/20      Recumbant Bike   Level 1.5    Minutes 15    METs 2      NuStep   Level 2    SPM 75    Minutes 15    METs 1.8      Prescription Details   Frequency (times per week) 3    Duration Progress to 30 minutes of continuous aerobic without signs/symptoms of physical distress      Intensity   THRR 40-80% of Max Heartrate 76-152    Ratings of Perceived Exertion 11-13    Perceived Dyspnea 0-4      Progression   Progression Continue progressive overload as per policy without signs/symptoms or physical distress.      Resistance Training   Training Prescription Yes    Weight 2 lbs    Reps 10-15           Perform Capillary Blood Glucose checks as needed.  Exercise Prescription Changes:   Exercise Comments:   Exercise Goals and Review:  Exercise Goals    Row Name 12/18/19 1526             Exercise Goals   Increase Physical Activity Yes       Intervention Provide advice, education, support and counseling about physical activity/exercise needs.;Develop an individualized exercise prescription for aerobic and resistive training based on initial evaluation findings, risk stratification, comorbidities and participant's personal goals.       Expected Outcomes Short Term: Attend rehab on a regular basis to increase amount of physical activity.;Long Term: Add in home exercise to make exercise part of routine and to increase amount of physical activity.;Long Term: Exercising regularly at least 3-5 days a week.       Increase Strength and Stamina Yes       Intervention Provide advice, education, support and counseling about physical activity/exercise needs.;Develop an individualized exercise prescription for aerobic and resistive training based on initial evaluation  findings, risk stratification, comorbidities and participant's personal goals.       Expected Outcomes Short Term: Increase workloads from initial exercise prescription for resistance, speed, and METs.;Short Term: Perform resistance training exercises routinely during  rehab and add in resistance training at home;Long Term: Improve cardiorespiratory fitness, muscular endurance and strength as measured by increased METs and functional capacity ( )       Able to understand and use rate of perceived exertion (RPE) scale Yes       Intervention Provide education and explanation on how to use RPE scale       Expected Outcomes Short Term: Able to use RPE daily in rehab to express subjective intensity level;Long Term:  Able to use RPE to guide intensity level when exercising independently       Knowledge and understanding of Target Heart Rate Range (THRR) Yes       Intervention Provide education and explanation of THRR including how the numbers were predicted and where they are located for reference       Expected Outcomes Short Term: Able to state/look up THRR;Short Term: Able to use daily as guideline for intensity in rehab;Long Term: Able to use THRR to govern intensity when exercising independently       Understanding of Exercise Prescription Yes       Intervention Provide education, explanation, and written materials on patient's individual exercise prescription       Expected Outcomes Short Term: Able to explain program exercise prescription;Long Term: Able to explain home exercise prescription to exercise independently              Exercise Goals Re-Evaluation :    Discharge Exercise Prescription (Final Exercise Prescription Changes):   Nutrition:  Target Goals: Understanding of nutrition guidelines, daily intake of sodium 1500mg , cholesterol 200mg , calories 30% from fat and 7% or less from saturated fats, daily to have 5 or more servings of fruits and vegetables.  Biometrics:  Pre  Biometrics - 12/18/19 1330      Pre Biometrics   Waist Circumference 36.5 inches    Hip Circumference 38 inches    Waist to Hip Ratio 0.96 %    Triceps Skinfold 20 mm    % Body Fat 35.4 %    Grip Strength 28 kg    Flexibility 17.75 in    Single Leg Stand 30 seconds            Nutrition Therapy Plan and Nutrition Goals:   Nutrition Assessments:   Nutrition Goals Re-Evaluation:   Nutrition Goals Discharge (Final Nutrition Goals Re-Evaluation):   Psychosocial: Target Goals: Acknowledge presence or absence of significant depression and/or stress, maximize coping skills, provide positive support system. Participant is able to verbalize types and ability to use techniques and skills needed for reducing stress and depression.  Initial Review & Psychosocial Screening:  Initial Psych Review & Screening - 12/18/19 1523      Initial Review   Current issues with None Identified      Family Dynamics   Good Support System? Yes   Patricia Frederick has her husband and children for support     Barriers   Psychosocial barriers to participate in program There are no identifiable barriers or psychosocial needs.      Screening Interventions   Interventions Encouraged to exercise           Quality of Life Scores:  Quality of Life - 12/18/19 1523      Quality of Life   Select Quality of Life      Quality of Life Scores   Health/Function Pre 27.43 %    Socioeconomic Pre 30 %    Psych/Spiritual Pre 27.43 %    Family Pre  26.4 %    GLOBAL Pre 27.75 %          Scores of 19 and below usually indicate a poorer quality of life in these areas.  A difference of  2-3 points is a clinically meaningful difference.  A difference of 2-3 points in the total score of the Quality of Life Index has been associated with significant improvement in overall quality of life, self-image, physical symptoms, and general health in studies assessing change in quality of life.  PHQ-9: Recent Review  Flowsheet Data    Depression screen [employer] 2/9 12/18/2019 12/18/2019 07/10/2018   Decreased Interest 0 0 0   Down, Depressed, Hopeless 0 0 0   PHQ - 2 Score 0 0 0   Altered sleeping - - 0   Tired, decreased energy - - 0   Change in appetite - - 0   Feeling bad or failure about yourself  - - 0   Trouble concentrating - - 0   Moving slowly or fidgety/restless - - 0   Suicidal thoughts - - 0   PHQ-9 Score - - 0     Interpretation of Total Score  Total Score Depression Severity:  1-4 = Minimal depression, 5-9 = Mild depression, 10-14 = Moderate depression, 15-19 = Moderately severe depression, 20-27 = Severe depression   Psychosocial Evaluation and Intervention:   Psychosocial Re-Evaluation:   Psychosocial Discharge (Final Psychosocial Re-Evaluation):   Vocational Rehabilitation: Provide vocational rehab assistance to qualifying candidates.   Vocational Rehab Evaluation & Intervention:  Vocational Rehab - 12/18/19 1524      Initial Vocational Rehab Evaluation & Intervention   Assessment shows need for Vocational Rehabilitation No   Dany Maya hopes to return to exercise and does not need vocational rehab at this time          Education: Education Goals: Education classes will be provided on a weekly basis, covering required topics. Participant will state understanding/return demonstration of topics presented.  Learning Barriers/Preferences:  Learning Barriers/Preferences - 12/18/19 1521      Learning Barriers/Preferences   Learning Barriers Language    Learning Preferences None           Education Topics: Hypertension, Hypertension Reduction -Define heart disease and high blood pressure. Discus how high blood pressure affects the body and ways to reduce high blood pressure.   Exercise and Your Heart -Discuss why it is important to exercise, the FITT principles of exercise, normal and abnormal responses to exercise, and how to exercise safely.   Angina -Discuss  definition of angina, causes of angina, treatment of angina, and how to decrease risk of having angina.   Cardiac Medications -Review what the following cardiac medications are used for, how they affect the body, and side effects that may occur when taking the medications.  Medications include Aspirin, Beta blockers, calcium channel blockers, ACE Inhibitors, angiotensin receptor blockers, diuretics, digoxin, and antihyperlipidemics.   Congestive Heart Failure -Discuss the definition of CHF, how to live with CHF, the signs and symptoms of CHF, and how keep track of weight and sodium intake.   Heart Disease and Intimacy -Discus the effect sexual activity has on the heart, how changes occur during intimacy as we age, and safety during sexual activity.   Smoking Cessation / COPD -Discuss different methods to quit smoking, the health benefits of quitting smoking, and the definition of COPD.   Nutrition I: Fats -Discuss the types of cholesterol, what cholesterol does to the heart, and how cholesterol levels  can be controlled.   Nutrition II: Labels -Discuss the different components of food labels and how to read food label   Heart Parts/Heart Disease and PAD -Discuss the anatomy of the heart, the pathway of blood circulation through the heart, and these are affected by heart disease.   Stress I: Signs and Symptoms -Discuss the causes of stress, how stress may lead to anxiety and depression, and ways to limit stress.   Stress II: Relaxation -Discuss different types of relaxation techniques to limit stress.   Warning Signs of Stroke / TIA -Discuss definition of a stroke, what the signs and symptoms are of a stroke, and how to identify when someone is having stroke.   Knowledge Questionnaire Score:  Knowledge Questionnaire Score - 12/18/19 1521      Knowledge Questionnaire Score   Pre Score 16/24           Core Components/Risk Factors/Patient Goals at Admission:  Personal  Goals and Risk Factors at Admission - 12/18/19 1522      Core Components/Risk Factors/Patient Goals on Admission    Weight Management Yes;Weight Loss    Intervention Weight Management: Develop a combined nutrition and exercise program designed to reach desired caloric intake, while maintaining appropriate intake of nutrient and fiber, sodium and fats, and appropriate energy expenditure required for the weight goal.;Weight Management: Provide education and appropriate resources to help participant work on and attain dietary goals.;Weight Management/Obesity: Establish reasonable short term and long term weight goals.    Admit Weight 128 lb 4.9 oz (58.2 kg)    Expected Outcomes Short Term: Continue to assess and modify interventions until short term weight is achieved;Long Term: Adherence to nutrition and physical activity/exercise program aimed toward attainment of established weight goal;Weight Maintenance: Understanding of the daily nutrition guidelines, which includes 25-35% calories from fat, 7% or less cal from saturated fats, less than 200mg  cholesterol, less than 1.5gm of sodium, & 5 or more servings of fruits and vegetables daily;Weight Loss: Understanding of general recommendations for a balanced deficit meal plan, which promotes 1-2 lb weight loss per week and includes a negative energy balance of [phone] kcal/d;Understanding recommendations for meals to include 15-35% energy as protein, 25-35% energy from fat, 35-60% energy from carbohydrates, less than 200mg  of dietary cholesterol, 20-35 gm of total fiber daily;Understanding of distribution of calorie intake throughout the day with the consumption of 4-5 meals/snacks           Core Components/Risk Factors/Patient Goals Review:    Core Components/Risk Factors/Patient Goals at Discharge (Final Review):    ITP Comments:  ITP Comments    Row Name 12/18/19 1352           ITP Comments Dr MD, Medical Director               Comments: 13/04/21 attended orientation on 12/18/2019 to review rules and guidelines for program.  Completed 6 minute walk test, Intitial ITP, and exercise prescription.  VSS. Telemetry-Sinus Rhythm.  Asymptomatic. Safety measures and social distancing in place per CDC guidelines.Janey Greaser, RN,BSN 12/18/2019 3:49 PM

## 2019-12-19 ENCOUNTER — Other Ambulatory Visit: Payer: Self-pay

## 2019-12-19 ENCOUNTER — Ambulatory Visit (INDEPENDENT_AMBULATORY_CARE_PROVIDER_SITE_OTHER): Payer: BC Managed Care – PPO | Admitting: *Deleted

## 2019-12-19 DIAGNOSIS — Z5181 Encounter for therapeutic drug level monitoring: Secondary | ICD-10-CM | POA: Diagnosis not present

## 2019-12-19 DIAGNOSIS — Z952 Presence of prosthetic heart valve: Secondary | ICD-10-CM | POA: Diagnosis not present

## 2019-12-19 LAB — POCT INR: INR: 2.2 (ref 2.0–3.0)

## 2019-12-19 NOTE — Patient Instructions (Addendum)
Description   Continue taking 3 tablets daily except 4 tablets on Mondays. Recheck INR in 3 weeks.  [facility] [phone].

## 2019-12-22 ENCOUNTER — Encounter (HOSPITAL_COMMUNITY)
Admission: RE | Admit: 2019-12-22 | Discharge: 2019-12-22 | Disposition: A | Discharge status: Home / Self Care | Payer: BC Managed Care – PPO | Source: Ambulatory Visit | Attending: Cardiovascular Disease | Admitting: Cardiovascular Disease

## 2019-12-22 ENCOUNTER — Other Ambulatory Visit: Payer: Self-pay

## 2019-12-22 DIAGNOSIS — Z954 Presence of other heart-valve replacement: Secondary | ICD-10-CM | POA: Diagnosis not present

## 2019-12-22 NOTE — Progress Notes (Signed)
Daily Session Note  Patient Details  Name: Patricia Frederick MRN: [id number] Date of Birth: [birth year] Referring Provider:     CARDIAC REHAB PHASE II ORIENTATION from 12/18/2019 in [facility]  Referring Provider Patricia Rouge, MD      Encounter Date: 12/22/2019  Check In:  Session Check In - 12/22/19 1517      Check-In   Supervising physician immediately available to respond to emergencies [facility] immediately available    Physician(s) Dr Verlon Au    Location [facility]    Staff Present Lesly Rubenstein, MS, EP-C, CCRP;Monalisa Bayless, RN, Luisa Hart, RN, Deland Pretty, MS, ACSM CEP, Exercise Physiologist;Jessica Hassell Done, MS, ACSM-CEP, Exercise Physiologist    Virtual Visit No    Medication changes reported     No    Fall or balance concerns reported    No    Tobacco Cessation No Change    Warm-up and Cool-down Performed on first and last piece of equipment    Resistance Training Performed Yes    VAD Patient? No    PAD/SET Patient? No      Pain Assessment   Currently in Pain? No/denies    Multiple Pain Sites No           Capillary Blood Glucose: No results found for this or any previous visit (from the past 24 hour(s)).   Exercise Prescription Changes - 12/22/19 1600      Response to Exercise   Blood Pressure (Admit) 112/76    Blood Pressure (Exercise) 114/70    Blood Pressure (Exit) 102/60    Heart Rate (Admit) 99 bpm    Heart Rate (Exercise) 11 bpm    Heart Rate (Exit) 100 bpm    Rating of Perceived Exertion (Exercise) 9    Symptoms None    Comments Pt's first day of exercise in the CRP2 program    Duration Progress to 30 minutes of  aerobic without signs/symptoms of physical distress    Intensity THRR unchanged      Progression   Progression Continue to progress workloads to maintain intensity without signs/symptoms of physical distress.    Average METs 2.4      Resistance Training    Training Prescription Yes    Weight 2 lbs    Reps 10-15    Time 10 Minutes      Interval Training   Interval Training No      NuStep   Level 2    SPM 85    Minutes 30    METs 2.4           Social History   Tobacco Use  Smoking Status Never Smoker  Smokeless Tobacco Never Used    Goals Met:  Exercise tolerated well No report of cardiac concerns or symptoms Strength training completed today  Goals Unmet:  Not Applicable  Comments: Patricia Frederick started cardiac rehab today.  Pt tolerated light exercise without difficulty. VSS, telemetry-Sinus Rhythm, asymptomatic.  Medication list reconciled. Pt denies barriers to medicaiton compliance.  PSYCHOSOCIAL ASSESSMENT:  PHQ-0. Pt exhibits positive coping skills, hopeful outlook with supportive family. No psychosocial needs identified at this time, no psychosocial interventions necessary.  Pt oriented to exercise equipment and routine.    Understanding verbalized. Patricia Frederick had an interpreter present today. Patricia Frederick signed a waiver for an interpreter on her exercise sessions only. Patricia Frederick says that she know enough English to exercise without an interpreter. An interpreter has  has been  scheduled for Friday before class for Patricia Frederick to meet with the dietitian.Patricia Pall, RN,BSN 12/23/2019 7:46 AM    Dr. Fransico Him is Medical Director for Cardiac Rehab at [facility].

## 2019-12-24 ENCOUNTER — Other Ambulatory Visit: Payer: Self-pay

## 2019-12-24 ENCOUNTER — Encounter (HOSPITAL_COMMUNITY)
Admission: RE | Admit: 2019-12-24 | Discharge: 2019-12-24 | Disposition: A | Discharge status: Home / Self Care | Payer: BC Managed Care – PPO | Source: Ambulatory Visit | Attending: Cardiovascular Disease | Admitting: Cardiovascular Disease

## 2019-12-24 DIAGNOSIS — Z954 Presence of other heart-valve replacement: Secondary | ICD-10-CM | POA: Diagnosis not present

## 2019-12-26 ENCOUNTER — Other Ambulatory Visit: Payer: Self-pay

## 2019-12-26 ENCOUNTER — Encounter (HOSPITAL_COMMUNITY)
Admission: RE | Admit: 2019-12-26 | Discharge: 2019-12-26 | Disposition: A | Discharge status: Home / Self Care | Payer: BC Managed Care – PPO | Source: Ambulatory Visit | Attending: Cardiovascular Disease | Admitting: Cardiovascular Disease

## 2019-12-26 DIAGNOSIS — Z954 Presence of other heart-valve replacement: Secondary | ICD-10-CM | POA: Diagnosis not present

## 2019-12-26 NOTE — Progress Notes (Signed)
Patricia Frederick 29 y.o. female Nutrition Note Visit Diagnosis: S/P aortic valve replacement with mechanical valve 10/15/19  Past Medical History:  Diagnosis Date  . Anemia   . Aortic stenosis, severe    echo 9/12: EF 55-65%, severe AS, mean gradient 49 mmHg, unicuspid Aortic Valve  . 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 Rheumatic heart disease 2011  . S/P aortic valve replacement with mechanical valve 10/15/2019   21 mm Carbomedics Top Hat bileaflet mechanical valve with aortic root enlargement  . Tachycardia      Medications reviewed.   Current Outpatient Medications:  .  acetaminophen (TYLENOL) 500 MG tablet, Take 500 mg by mouth every 6 (six) hours as needed for mild pain.  (Patient not taking: Reported on 12/18/2019), Disp: , Rfl:  .  aspirin EC 81 MG EC tablet, Take 1 tablet (81 mg total) by mouth daily. Swallow whole. (Patient not taking: Reported on 12/13/2019), Disp: , Rfl:  .  FeFum-FePoly-FA-B Cmp-C-Biot (INTEGRA PLUS) CAPS, Take 1 capsule by mouth every morning. (Patient not taking: Reported on 12/18/2019), Disp: , Rfl:  .  metoprolol tartrate (LOPRESSOR) 25 MG tablet, Take 12.5 mg by mouth 2 (two) times daily. (Patient not taking: Reported on 12/17/2019), Disp: , Rfl:  .  warfarin (COUMADIN) 2 MG tablet, TAKE 2 TO 3 TABLETS BY MOUTH EVERY DAY AS DIRECTED BY COUMADIN CLINIC, Disp: 100 tablet, Rfl: 1   Ht Readings from Last 1 Encounters:  12/18/19 4' 10.25" (1.48 m)     Wt Readings from Last 3 Encounters:  12/18/19 128 lb 4.9 oz (58.2 kg)  11/17/19 128 lb (58.1 kg)  10/24/19 127 lb 9.6 oz (57.9 kg)     There is no height or weight on file to calculate BMI.   Social History   Tobacco Use  Smoking Status Never Smoker  Smokeless Tobacco Never Used     No results found for: CHOL No results found for: HDL No results found for: LDLCALC No results found for: TRIG   Lab Results  Component Value Date   HGBA1C 5.6 10/13/2019     CBG (last 3)  No  results for input(s): GLUCAP in the last 72 hours.   Nutrition Note  Spoke with pt. Nutrition Plan and Nutrition Survey goals reviewed with pt. Pt is following a Heart Healthy diet. She is cooking at home using fresh ingredients. B - tea/cookies L- rice and veggies D- rice and veggies  Reviewed vitamin k and coumadin. INR WNL. She is eating consistent intake of high and moderate k foods. She has a vitamin k handout at home.   Reviewed low sodium nutrition therapy with recommendation of 2000 mg. She uses salt with her cooking. Not many packaged foods. She drinks water and hot tea. Occasionally small amount of juice.  Pt expressed understanding of the information reviewed.   Nutrition Diagnosis ? Food-and nutrition-related knowledge deficit related to lack of exposure to information as related to diagnosis of: ? CVD ?   Nutrition Intervention ? Pt's individual nutrition plan reviewed with pt. ? Benefits of adopting Heart Healthy diet discussed when Medficts reviewed.   ? Continue client-centered nutrition education by RD, as part of interdisciplinary care.  Goal(s)  ? Pt to build a healthy plate including vegetables, fruits, whole grains, and low-fat dairy products in a heart healthy meal plan.  Plan:   Will provide client-centered nutrition education as part of interdisciplinary care  Monitor and evaluate progress toward  nutrition goal with team.   Michaele Offer, MS, RDN, LDN

## 2019-12-29 ENCOUNTER — Encounter (HOSPITAL_COMMUNITY)
Admission: RE | Admit: 2019-12-29 | Discharge: 2019-12-29 | Disposition: A | Discharge status: Home / Self Care | Payer: BC Managed Care – PPO | Source: Ambulatory Visit | Attending: Cardiovascular Disease | Admitting: Cardiovascular Disease

## 2019-12-29 ENCOUNTER — Other Ambulatory Visit: Payer: Self-pay

## 2019-12-29 DIAGNOSIS — Z954 Presence of other heart-valve replacement: Secondary | ICD-10-CM | POA: Diagnosis not present

## 2019-12-31 ENCOUNTER — Other Ambulatory Visit: Payer: Self-pay

## 2019-12-31 ENCOUNTER — Encounter (HOSPITAL_COMMUNITY)
Admission: RE | Admit: 2019-12-31 | Discharge: 2019-12-31 | Disposition: A | Discharge status: Home / Self Care | Payer: BC Managed Care – PPO | Source: Ambulatory Visit | Attending: Cardiovascular Disease | Admitting: Cardiovascular Disease

## 2019-12-31 DIAGNOSIS — Z954 Presence of other heart-valve replacement: Secondary | ICD-10-CM | POA: Diagnosis not present

## 2020-01-02 ENCOUNTER — Encounter (HOSPITAL_COMMUNITY)
Admission: RE | Admit: 2020-01-02 | Discharge: 2020-01-02 | Disposition: A | Discharge status: Home / Self Care | Payer: BC Managed Care – PPO | Source: Ambulatory Visit | Attending: Cardiovascular Disease | Admitting: Cardiovascular Disease

## 2020-01-02 ENCOUNTER — Other Ambulatory Visit: Payer: Self-pay

## 2020-01-02 DIAGNOSIS — Z954 Presence of other heart-valve replacement: Secondary | ICD-10-CM | POA: Diagnosis not present

## 2020-01-05 ENCOUNTER — Encounter (HOSPITAL_COMMUNITY)
Admission: RE | Admit: 2020-01-05 | Discharge: 2020-01-05 | Disposition: A | Discharge status: Home / Self Care | Payer: BC Managed Care – PPO | Source: Ambulatory Visit | Attending: Cardiovascular Disease | Admitting: Cardiovascular Disease

## 2020-01-05 ENCOUNTER — Other Ambulatory Visit: Payer: Self-pay

## 2020-01-05 DIAGNOSIS — Z954 Presence of other heart-valve replacement: Secondary | ICD-10-CM | POA: Diagnosis not present

## 2020-01-05 NOTE — Progress Notes (Signed)
Reviewed home exercise with patient today with the assistance of translator. Pt agrees to walk at home 2-3x/week for 30 minutes. We reviewed the importance of warm-up, cool-down and stretching with exercise. We also reviewed THRR of 76-152 and stressed keeping effort between 11-13 on the RPE scale. Fluids were encouraged before, during and after exercise. Weather parameters for temperature and humidity reviewed. S/S to terminate exercise discussed and when to call MD vs 911. Pt verbalized understanding through translator and pat was provided a copy of the home exercise Rx.   Lorin Picket MS, ACSM-EP-C, CCRP

## 2020-01-06 NOTE — Progress Notes (Signed)
Cardiac Individual Treatment Plan  Patient Details  Name: Patricia Frederick MRN: [id number] Date of Birth: Jan 26, 1991 Referring Provider:     CARDIAC REHAB PHASE II ORIENTATION from 12/18/2019 in [facility] CARDIAC REHAB  Referring Provider Charlton Haws, MD      Initial Encounter Date:    CARDIAC REHAB PHASE II ORIENTATION from 12/18/2019 in [facility] CARDIAC REHAB  Date 12/18/19      Visit Diagnosis: S/P aortic valve replacement with mechanical valve 10/15/19  Patient's Home Medications on Admission:  Current Outpatient Medications:  .  acetaminophen (TYLENOL) 500 MG tablet, Take 500 mg by mouth every 6 (six) hours as needed for mild pain.  (Patient not taking: Reported on 12/18/2019), Disp: , Rfl:  .  aspirin EC 81 MG EC tablet, Take 1 tablet (81 mg total) by mouth daily. Swallow whole. (Patient not taking: Reported on 12/13/2019), Disp: , Rfl:  .  FeFum-FePoly-FA-B Cmp-C-Biot (INTEGRA PLUS) CAPS, Take 1 capsule by mouth every morning. (Patient not taking: Reported on 12/18/2019), Disp: , Rfl:  .  metoprolol tartrate (LOPRESSOR) 25 MG tablet, Take 12.5 mg by mouth 2 (two) times daily. (Patient not taking: Reported on 12/17/2019), Disp: , Rfl:  .  warfarin (COUMADIN) 2 MG tablet, TAKE 2 TO 3 TABLETS BY MOUTH EVERY DAY AS DIRECTED BY COUMADIN CLINIC, Disp: 100 tablet, Rfl: 1  Past Medical History: Past Medical History:  Diagnosis Date  . Anemia   . Aortic stenosis, severe    echo 9/12: EF 55-65%, severe AS, mean gradient 49 mmHg, unicuspid Aortic Valve  . 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 Rheumatic heart disease 2011  . S/P aortic valve replacement with mechanical valve 10/15/2019   21 mm Carbomedics [place] bileaflet mechanical valve with aortic root enlargement  . Tachycardia     Tobacco Use: Social History   Tobacco Use  Smoking Status Never Smoker  Smokeless Tobacco Never Used    Labs: Recent Review Flowsheet Data     Labs for ITP Cardiac and Pulmonary Rehab Latest Ref Rng & Units 10/15/2019 10/15/2019 10/15/2019 10/15/2019 10/15/2019   Hemoglobin A1c 4.8 - 5.6 % - - - - -   PHART 7.35 - 7.45 - 7.397 - 7.262(L) 7.298(L)   PCO2ART 32 - 48 mmHg - 35.6 - 46.0 42.3   HCO3 20.0 - 28.0 mmol/L - 21.9 - 20.8 20.5   TCO2 22 - 32 mmol/L 22 23 22 22 22    ACIDBASEDEF 0.0 - 2.0 mmol/L - 3.0(H) - 6.0(H) 5.0(H)   O2SAT % - 100.0 - 99.0 99.0      Capillary Blood Glucose: Lab Results  Component Value Date   GLUCAP 139 (H) 10/17/2019   GLUCAP 114 (H) 10/17/2019   GLUCAP 99 10/17/2019   GLUCAP 111 (H) 10/16/2019   GLUCAP 117 (H) 10/16/2019     Exercise Target Goals: Exercise Program Goal: Individual exercise prescription set using results from initial 6 min walk test and THRR while considering  patient's activity barriers and safety.   Exercise Prescription Goal: Starting with aerobic activity 30 plus minutes a day, 3 days per week for initial exercise prescription. Provide home exercise prescription and guidelines that participant acknowledges understanding prior to discharge.  Activity Barriers & Risk Stratification:  Activity Barriers & Cardiac Risk Stratification - 12/18/19 1526      Activity Barriers & Cardiac Risk Stratification   Activity Barriers None    Cardiac Risk Stratification High  6 Minute Walk:  6 Minute Walk    Row Name 12/18/19 1524         6 Minute Walk   Phase Initial     Distance 1227 feet     Walk Time 6 minutes     # of Rest Breaks 0     MPH 2.3     METS 4.76     RPE 7     Perceived Dyspnea  0     VO2 Peak 16.67     Symptoms No     Resting HR 98 bpm     Resting BP 98/78     Resting Oxygen Saturation  100 %     Exercise Oxygen Saturation  during 6 min walk 99 %     Max Ex. HR 111 bpm     Max Ex. BP 112/70     2 Minute Post BP 104/60            Oxygen Initial Assessment:   Oxygen Re-Evaluation:   Oxygen Discharge (Final Oxygen Re-Evaluation):   Initial  Exercise Prescription:  Initial Exercise Prescription - 12/18/19 1500      Date of Initial Exercise RX and Referring Provider   Date 12/18/19    Referring Provider Charlton Haws, MD    Expected Discharge Date 02/11/20      Recumbant Bike   Level 1.5    Minutes 15    METs 2      NuStep   Level 2    SPM 75    Minutes 15    METs 1.8      Prescription Details   Frequency (times per week) 3    Duration Progress to 30 minutes of continuous aerobic without signs/symptoms of physical distress      Intensity   THRR 40-80% of Max Heartrate 76-152    Ratings of Perceived Exertion 11-13    Perceived Dyspnea 0-4      Progression   Progression Continue progressive overload as per policy without signs/symptoms or physical distress.      Resistance Training   Training Prescription Yes    Weight 2 lbs    Reps 10-15           Perform Capillary Blood Glucose checks as needed.  Exercise Prescription Changes:   Exercise Prescription Changes    Row Name 12/22/19 1600 12/31/19 1630           Response to Exercise   Blood Pressure (Admit) 112/76 108/70      Blood Pressure (Exercise) 114/70 104/70      Blood Pressure (Exit) 102/60 98/64      Heart Rate (Admit) 99 bpm 99 bpm      Heart Rate (Exercise) 11 bpm 141 bpm      Heart Rate (Exit) 100 bpm 99 bpm      Rating of Perceived Exertion (Exercise) 9 13      Symptoms None None      Comments Pt's first day of exercise in the CRP2 program Reviewed home exercise Rx with patient/translator      Duration Progress to 30 minutes of  aerobic without signs/symptoms of physical distress Continue with 30 min of aerobic exercise without signs/symptoms of physical distress.      Intensity THRR unchanged THRR unchanged        Progression   Progression Continue to progress workloads to maintain intensity without signs/symptoms of physical distress. Continue to progress workloads to maintain intensity without signs/symptoms of physical  distress.       Average METs 2.4 3.9        Resistance Training   Training Prescription Yes Yes      Weight 2 lbs 2 lbs      Reps 10-15 10-15      Time 10 Minutes 10 Minutes        Interval Training   Interval Training No No        NuStep   Level 2 3      SPM 85 100      Minutes 30 30      METs 2.4 3.9        Home Exercise Plan   Plans to continue exercise at -- Home (comment)      Frequency -- Add 3 additional days to program exercise sessions.      Initial Home Exercises Provided -- 12/31/19             Exercise Comments:   Exercise Comments    Row Name 12/22/19 1628 12/31/19 1630         Exercise Comments Pt's fist day of exercise in the CRP2 program. Pt tolerated the exercise session well. Reviewed home exercise Rx with patient today with assistance of translator. Pt will walk at home 2-3x/week for 30 minutes. Pt verbalized underrstanding of the HEP and was provided copy.             Exercise Goals and Review:   Exercise Goals    Row Name 12/18/19 1526             Exercise Goals   Increase Physical Activity Yes       Intervention Provide advice, education, support and counseling about physical activity/exercise needs.;Develop an individualized exercise prescription for aerobic and resistive training based on initial evaluation findings, risk stratification, comorbidities and participant's personal goals.       Expected Outcomes Short Term: Attend rehab on a regular basis to increase amount of physical activity.;Long Term: Add in home exercise to make exercise part of routine and to increase amount of physical activity.;Long Term: Exercising regularly at least 3-5 days a week.       Increase Strength and Stamina Yes       Intervention Provide advice, education, support and counseling about physical activity/exercise needs.;Develop an individualized exercise prescription for aerobic and resistive training based on initial evaluation findings, risk stratification,  comorbidities and participant's personal goals.       Expected Outcomes Short Term: Increase workloads from initial exercise prescription for resistance, speed, and METs.;Short Term: Perform resistance training exercises routinely during rehab and add in resistance training at home;Long Term: Improve cardiorespiratory fitness, muscular endurance and strength as measured by increased METs and functional capacity ( )       Able to understand and use rate of perceived exertion (RPE) scale Yes       Intervention Provide education and explanation on how to use RPE scale       Expected Outcomes Short Term: Able to use RPE daily in rehab to express subjective intensity level;Long Term:  Able to use RPE to guide intensity level when exercising independently       Knowledge and understanding of Target Heart Rate Range (THRR) Yes       Intervention Provide education and explanation of THRR including how the numbers were predicted and where they are located for reference       Expected Outcomes Short Term: Able to state/look up THRR;Short Term: Able  to use daily as guideline for intensity in rehab;Long Term: Able to use THRR to govern intensity when exercising independently       Understanding of Exercise Prescription Yes       Intervention Provide education, explanation, and written materials on patient's individual exercise prescription       Expected Outcomes Short Term: Able to explain program exercise prescription;Long Term: Able to explain home exercise prescription to exercise independently              Exercise Goals Re-Evaluation :  Exercise Goals Re-Evaluation    Row Name 12/22/19 1627 01/05/20 1426           Exercise Goal Re-Evaluation   Exercise Goals Review Increase Physical Activity;Increase Strength and Stamina;Able to understand and use rate of perceived exertion (RPE) scale;Knowledge and understanding of Target Heart Rate Range (THRR);Understanding of Exercise Prescription Increase  Physical Activity;Increase Strength and Stamina;Able to understand and use rate of perceived exertion (RPE) scale;Knowledge and understanding of Target Heart Rate Range (THRR);Able to check pulse independently;Understanding of Exercise Prescription      Comments Pt's first day of exercise in the CRP2 program. Pt tolerated the exercise session well and understands the exercise Rx and the RPE scale. Reviewed home exercise Rx with patient with the help of translator. Per pt, there is a family member at home who reads english. Pt given a copy of the exercise Rx in english. Pt verbalized understanding of home exercise Rx through translator, Pt will walk at home 2-3x/week 30 minutes.      Expected Outcomes Will continue to monitor and progress patient as tolerated. Pt will walk at home for 30 minutes 2-3x/week.              Discharge Exercise Prescription (Final Exercise Prescription Changes):  Exercise Prescription Changes - 12/31/19 1630      Response to Exercise   Blood Pressure (Admit) 108/70    Blood Pressure (Exercise) 104/70    Blood Pressure (Exit) 98/64    Heart Rate (Admit) 99 bpm    Heart Rate (Exercise) 141 bpm    Heart Rate (Exit) 99 bpm    Rating of Perceived Exertion (Exercise) 13    Symptoms None    Comments Reviewed home exercise Rx with patient/translator    Duration Continue with 30 min of aerobic exercise without signs/symptoms of physical distress.    Intensity THRR unchanged      Progression   Progression Continue to progress workloads to maintain intensity without signs/symptoms of physical distress.    Average METs 3.9      Resistance Training   Training Prescription Yes    Weight 2 lbs    Reps 10-15    Time 10 Minutes      Interval Training   Interval Training No      NuStep   Level 3    SPM 100    Minutes 30    METs 3.9      Home Exercise Plan   Plans to continue exercise at Home (comment)    Frequency Add 3 additional days to program exercise  sessions.    Initial Home Exercises Provided 12/31/19           Nutrition:  Target Goals: Understanding of nutrition guidelines, daily intake of sodium 1500mg , cholesterol 200mg , calories 30% from fat and 7% or less from saturated fats, daily to have 5 or more servings of fruits and vegetables.  Biometrics:  Pre Biometrics - 12/18/19 1330  Pre Biometrics   Waist Circumference 36.5 inches    Hip Circumference 38 inches    Waist to Hip Ratio 0.96 %    Triceps Skinfold 20 mm    % Body Fat 35.4 %    Grip Strength 28 kg    Flexibility 17.75 in    Single Leg Stand 30 seconds            Nutrition Therapy Plan and Nutrition Goals:  Nutrition Therapy & Goals - 12/26/19 1539      Nutrition Therapy   Diet Heart healthy/low sodium    Drug/Food Interactions Coumadin/Vit K      Personal Nutrition Goals   Nutrition Goal Pt to build a healthy plate including vegetables, fruits, whole grains, and low-fat dairy products in a heart healthy meal plan      Intervention Plan   Intervention Prescribe, educate and counsel regarding individualized specific dietary modifications aiming towards targeted core components such as weight, hypertension, lipid management, diabetes, heart failure and other comorbidities.;Nutrition handout(s) given to patient.    Expected Outcomes Short Term Goal: Understand basic principles of dietary content, such as calories, fat, sodium, cholesterol and nutrients.           Nutrition Assessments:  MEDIFICTS Score Key:  ?70 Need to make dietary changes   40-70 Heart Healthy Diet  ? 40 Therapeutic Level Cholesterol Diet   Picture Your Plate Scores:  <24 Unhealthy dietary pattern with much room for improvement.  41-50 Dietary pattern unlikely to meet recommendations for good health and room for improvement.  51-60 More healthful dietary pattern, with some room for improvement.   >60 Healthy dietary pattern, although there may be some specific  behaviors that could be improved.    Nutrition Goals Re-Evaluation:  Nutrition Goals Re-Evaluation    Row Name 12/26/19 1540             Goals   Current Weight 128 lb (58.1 kg)       Nutrition Goal Pt to build a healthy plate including vegetables, fruits, whole grains, and low-fat dairy products in a heart healthy meal plan              Nutrition Goals Discharge (Final Nutrition Goals Re-Evaluation):  Nutrition Goals Re-Evaluation - 12/26/19 1540      Goals   Current Weight 128 lb (58.1 kg)    Nutrition Goal Pt to build a healthy plate including vegetables, fruits, whole grains, and low-fat dairy products in a heart healthy meal plan           Psychosocial: Target Goals: Acknowledge presence or absence of significant depression and/or stress, maximize coping skills, provide positive support system. Participant is able to verbalize types and ability to use techniques and skills needed for reducing stress and depression.  Initial Review & Psychosocial Screening:  Initial Psych Review & Screening - 12/18/19 1523      Initial Review   Current issues with None Identified      Family Dynamics   Good Support System? Yes   Nykia Turko has her husband and children for support     Barriers   Psychosocial barriers to participate in program There are no identifiable barriers or psychosocial needs.      Screening Interventions   Interventions Encouraged to exercise           Quality of Life Scores:  Quality of Life - 12/18/19 1523      Quality of Life   Select Quality of Life  Quality of Life Scores   Health/Function Pre 27.43 %    Socioeconomic Pre 30 %    Psych/Spiritual Pre 27.43 %    Family Pre 26.4 %    GLOBAL Pre 27.75 %          Scores of 19 and below usually indicate a poorer quality of life in these areas.  A difference of  2-3 points is a clinically meaningful difference.  A difference of 2-3 points in the total score of the Quality of Life Index has  been associated with significant improvement in overall quality of life, self-image, physical symptoms, and general health in studies assessing change in quality of life.  PHQ-9: Recent Review Flowsheet Data    Depression screen [facility] 2/9 12/18/2019 12/18/2019 07/10/2018   Decreased Interest 0 0 0   Down, Depressed, Hopeless 0 0 0   PHQ - 2 Score 0 0 0   Altered sleeping - - 0   Tired, decreased energy - - 0   Change in appetite - - 0   Feeling bad or failure about yourself  - - 0   Trouble concentrating - - 0   Moving slowly or fidgety/restless - - 0   Suicidal thoughts - - 0   PHQ-9 Score - - 0     Interpretation of Total Score  Total Score Depression Severity:  1-4 = Minimal depression, 5-9 = Mild depression, 10-14 = Moderate depression, 15-19 = Moderately severe depression, 20-27 = Severe depression   Psychosocial Evaluation and Intervention:   Psychosocial Re-Evaluation:  Psychosocial Re-Evaluation    Row Name 01/06/20 1511             Psychosocial Re-Evaluation   Current issues with None Identified       Interventions Encouraged to attend Cardiac Rehabilitation for the exercise       Continue Psychosocial Services  No Follow up required              Psychosocial Discharge (Final Psychosocial Re-Evaluation):  Psychosocial Re-Evaluation - 01/06/20 1511      Psychosocial Re-Evaluation   Current issues with None Identified    Interventions Encouraged to attend Cardiac Rehabilitation for the exercise    Continue Psychosocial Services  No Follow up required           Vocational Rehabilitation: Provide vocational rehab assistance to qualifying candidates.   Vocational Rehab Evaluation & Intervention:  Vocational Rehab - 12/18/19 1524      Initial Vocational Rehab Evaluation & Intervention   Assessment shows need for Vocational Rehabilitation No   Latera Maya hopes to return to exercise and does not need vocational rehab at this time           Education: Education Goals: Education classes will be provided on a weekly basis, covering required topics. Participant will state understanding/return demonstration of topics presented.  Learning Barriers/Preferences:  Learning Barriers/Preferences - 12/18/19 1521      Learning Barriers/Preferences   Learning Barriers Language    Learning Preferences None           Education Topics: Hypertension, Hypertension Reduction -Define heart disease and high blood pressure. Discus how high blood pressure affects the body and ways to reduce high blood pressure.   Exercise and Your Heart -Discuss why it is important to exercise, the FITT principles of exercise, normal and abnormal responses to exercise, and how to exercise safely.   Angina -Discuss definition of angina, causes of angina, treatment of angina, and how to decrease  risk of having angina.   Cardiac Medications -Review what the following cardiac medications are used for, how they affect the body, and side effects that may occur when taking the medications.  Medications include Aspirin, Beta blockers, calcium channel blockers, ACE Inhibitors, angiotensin receptor blockers, diuretics, digoxin, and antihyperlipidemics.   Congestive Heart Failure -Discuss the definition of CHF, how to live with CHF, the signs and symptoms of CHF, and how keep track of weight and sodium intake.   Heart Disease and Intimacy -Discus the effect sexual activity has on the heart, how changes occur during intimacy as we age, and safety during sexual activity.   Smoking Cessation / COPD -Discuss different methods to quit smoking, the health benefits of quitting smoking, and the definition of COPD.   Nutrition I: Fats -Discuss the types of cholesterol, what cholesterol does to the heart, and how cholesterol levels can be controlled.   Nutrition II: Labels -Discuss the different components of food labels and how to read food label   Heart  Parts/Heart Disease and PAD -Discuss the anatomy of the heart, the pathway of blood circulation through the heart, and these are affected by heart disease.   Stress I: Signs and Symptoms -Discuss the causes of stress, how stress may lead to anxiety and depression, and ways to limit stress.   Stress II: Relaxation -Discuss different types of relaxation techniques to limit stress.   Warning Signs of Stroke / TIA -Discuss definition of a stroke, what the signs and symptoms are of a stroke, and how to identify when someone is having stroke.   Knowledge Questionnaire Score:  Knowledge Questionnaire Score - 12/18/19 1521      Knowledge Questionnaire Score   Pre Score 16/24           Core Components/Risk Factors/Patient Goals at Admission:  Personal Goals and Risk Factors at Admission - 12/18/19 1522      Core Components/Risk Factors/Patient Goals on Admission    Weight Management Yes;Weight Loss    Intervention Weight Management: Develop a combined nutrition and exercise program designed to reach desired caloric intake, while maintaining appropriate intake of nutrient and fiber, sodium and fats, and appropriate energy expenditure required for the weight goal.;Weight Management: Provide education and appropriate resources to help participant work on and attain dietary goals.;Weight Management/Obesity: Establish reasonable short term and long term weight goals.    Admit Weight 128 lb 4.9 oz (58.2 kg)    Expected Outcomes Short Term: Continue to assess and modify interventions until short term weight is achieved;Long Term: Adherence to nutrition and physical activity/exercise program aimed toward attainment of established weight goal;Weight Maintenance: Understanding of the daily nutrition guidelines, which includes 25-35% calories from fat, 7% or less cal from saturated fats, less than  cholesterol, less than 1.5gm of sodium, & 5 or more servings of fruits and vegetables daily;Weight  Loss: Understanding of general recommendations for a balanced deficit meal plan, which promotes 1-2 lb weight loss per week and includes a negative energy balance of [phone] kcal/d;Understanding recommendations for meals to include 15-35% energy as protein, 25-35% energy from fat, 35-60% energy from carbohydrates, less than  of dietary cholesterol, 20-35 gm of total fiber daily;Understanding of distribution of calorie intake throughout the day with the consumption of 4-5 meals/snacks           Core Components/Risk Factors/Patient Goals Review:   Goals and Risk Factor Review    Row Name 01/06/20 1511  Core Components/Risk Factors/Patient Goals Review   Personal Goals Review Weight Management/Obesity       Review Soriah Leeman is doing well with exercise. Zekiah Maya's vital signs have been stable.       Expected Outcomes Coraima Maya will continue to participaiton in phase 2 cardiac rehab for exercise nutrition and lifestyle modifications.              Core Components/Risk Factors/Patient Goals at Discharge (Final Review):   Goals and Risk Factor Review - 01/06/20 1511      Core Components/Risk Factors/Patient Goals Review   Personal Goals Review Weight Management/Obesity    Review Shereece Wellborn is doing well with exercise. Curlie Maya's vital signs have been stable.    Expected Outcomes Lorenia Maya will continue to participaiton in phase 2 cardiac rehab for exercise nutrition and lifestyle modifications.           ITP Comments:  ITP Comments    Row Name 12/18/19 1352 01/06/20 1507         ITP Comments Dr Armanda Magic MD, Medical Director 30 Day ITP Review. Wave Maya has good attendance and participation in phase 2 cardiac rehab.             Comments: See ITP Comments.Gladstone Lighter, RN,BSN 01/06/2020 3:15 PM

## 2020-01-07 ENCOUNTER — Encounter (HOSPITAL_COMMUNITY)
Admission: RE | Admit: 2020-01-07 | Discharge: 2020-01-07 | Disposition: A | Discharge status: Home / Self Care | Payer: BC Managed Care – PPO | Source: Ambulatory Visit | Attending: Cardiovascular Disease | Admitting: Cardiovascular Disease

## 2020-01-07 ENCOUNTER — Other Ambulatory Visit: Payer: Self-pay

## 2020-01-07 DIAGNOSIS — Z954 Presence of other heart-valve replacement: Secondary | ICD-10-CM | POA: Diagnosis not present

## 2020-01-09 ENCOUNTER — Encounter (HOSPITAL_COMMUNITY): Payer: BC Managed Care – PPO

## 2020-01-12 ENCOUNTER — Other Ambulatory Visit: Payer: Self-pay

## 2020-01-12 ENCOUNTER — Encounter (HOSPITAL_COMMUNITY)
Admission: RE | Admit: 2020-01-12 | Discharge: 2020-01-12 | Disposition: A | Discharge status: Home / Self Care | Payer: BC Managed Care – PPO | Source: Ambulatory Visit | Attending: Cardiovascular Disease | Admitting: Cardiovascular Disease

## 2020-01-12 ENCOUNTER — Ambulatory Visit (INDEPENDENT_AMBULATORY_CARE_PROVIDER_SITE_OTHER): Payer: BC Managed Care – PPO | Admitting: *Deleted

## 2020-01-12 DIAGNOSIS — Z954 Presence of other heart-valve replacement: Secondary | ICD-10-CM

## 2020-01-12 DIAGNOSIS — Z952 Presence of prosthetic heart valve: Secondary | ICD-10-CM

## 2020-01-12 DIAGNOSIS — Z5181 Encounter for therapeutic drug level monitoring: Secondary | ICD-10-CM | POA: Diagnosis not present

## 2020-01-12 LAB — POCT INR: INR: 1.6 — AB (ref 2.0–3.0)

## 2020-01-12 NOTE — Patient Instructions (Signed)
Description    Take 5 tablets today and 4 tablets tomorrow, then continue taking 3 tablets daily except 4 tablets on Mondays. Recheck INR in 2  weeks.  [facility] [phone].

## 2020-01-14 ENCOUNTER — Other Ambulatory Visit: Payer: Self-pay

## 2020-01-14 ENCOUNTER — Encounter (HOSPITAL_COMMUNITY)
Admission: RE | Admit: 2020-01-14 | Discharge: 2020-01-14 | Disposition: A | Discharge status: Home / Self Care | Payer: BC Managed Care – PPO | Source: Ambulatory Visit | Attending: Cardiovascular Disease | Admitting: Cardiovascular Disease

## 2020-01-14 DIAGNOSIS — Z954 Presence of other heart-valve replacement: Secondary | ICD-10-CM | POA: Insufficient documentation

## 2020-01-16 ENCOUNTER — Encounter (HOSPITAL_COMMUNITY)
Admission: RE | Admit: 2020-01-16 | Discharge: 2020-01-16 | Disposition: A | Discharge status: Home / Self Care | Payer: BC Managed Care – PPO | Source: Ambulatory Visit | Attending: Cardiovascular Disease | Admitting: Cardiovascular Disease

## 2020-01-16 ENCOUNTER — Other Ambulatory Visit: Payer: Self-pay

## 2020-01-16 DIAGNOSIS — Z954 Presence of other heart-valve replacement: Secondary | ICD-10-CM | POA: Diagnosis not present

## 2020-01-19 ENCOUNTER — Ambulatory Visit (INDEPENDENT_AMBULATORY_CARE_PROVIDER_SITE_OTHER): Payer: Self-pay | Admitting: Thoracic Surgery (Cardiothoracic Vascular Surgery)

## 2020-01-19 ENCOUNTER — Other Ambulatory Visit: Payer: Self-pay

## 2020-01-19 ENCOUNTER — Encounter: Payer: Self-pay | Admitting: Thoracic Surgery (Cardiothoracic Vascular Surgery)

## 2020-01-19 VITALS — BP 112/82 | HR 92 | Resp 20 | Ht <= 58 in | Wt 127.2 lb

## 2020-01-19 DIAGNOSIS — Z952 Presence of prosthetic heart valve: Secondary | ICD-10-CM

## 2020-01-19 DIAGNOSIS — Z954 Presence of other heart-valve replacement: Secondary | ICD-10-CM

## 2020-01-19 NOTE — Progress Notes (Signed)
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  Her postoperative recovery was uneventful and she was last seen here in our office on November 17, 2019.  At that time she was doing well although she was a little bit dizzy and her blood pressure was borderline low.  Metoprolol was stopped at that time.  Since then she underwent routine follow-up echocardiogram December 09, 2019.  Echocardiogram revealed normal left ventricular systolic function with ejection fraction estimated 60 to 65%.  There was normal functioning bileaflet mechanical prosthetic valve in the aortic position.  There was trivial aortic insufficiency with no sign of paravalvular leak.  Mean transvalvular gradient across the aortic valve was estimated 11.8 mmHg.  Patient returns to the office today with an interpreter present for her visit.  She is doing very well.  She has no complaints.  She has mild residual soreness in her chest that does not bother her very much.  She has no shortness of breath and she notes that her breathing is notably much better than it was prior to surgery.  She is not having any significant dizzy spells like she experienced at the time of her last office visit.  She states that occasionally she gets very mildly dizzy when she stands up from a sitting position.  Overall she feels quite well.  She is not having any problems with Coumadin therapy although her INR was subtherapeutic at her last visit in the anticoagulation clinic.  Current Outpatient Medications  Medication Sig Dispense Refill    . warfarin (COUMADIN) 2 MG tablet TAKE 2 TO 3 TABLETS BY MOUTH EVERY DAY AS DIRECTED BY COUMADIN CLINIC 100 tablet 1  . acetaminophen (TYLENOL) 500 MG tablet Take 500 mg by mouth every 6 (six) hours as needed for mild pain.  (Patient not taking: Reported on 01/19/2020)    . aspirin EC 81 MG EC tablet Take 1 tablet (81 mg total) by mouth daily. Swallow whole. (Patient not taking: Reported on 01/19/2020)    . FeFum-FePoly-FA-B Cmp-C-Biot (INTEGRA PLUS) CAPS Take 1 capsule by mouth every morning.  (Patient not taking: Reported on 01/19/2020)    . metoprolol tartrate (LOPRESSOR) 25 MG tablet Take 12.5 mg by mouth 2 (two) times daily.  (Patient not taking: Reported on 01/19/2020)     No current facility-administered medications for this visit.      Physical Exam:   BP 112/82 (BP Location: Right Arm, Patient Position: Sitting)   Pulse 92   Resp 20   Ht 4\' 10"  (1.473 m)   Wt 127 lb 3.2 oz (57.7 kg)   SpO2 98% Comment: RA with mask on  BMI 26.58 kg/m   General:  Well-appearing  Chest:   Clear to auscultation  CV:   Regular rate and rhythm with crisp mechanical heart valve sounds  Incisions:  Healing nicely, sternum is stable  Abdomen:  Soft nontender  Extremities:  Warm and well-perfused  Diagnostic Tests:   ECHOCARDIOGRAM REPORT  Patient Name:  QUETZALLY CALLAS Date of Exam: 12/09/2019  Medical Rec #: [id number]      Height:    62.0 in  Accession #:  [id number]      Weight:    128.0 lb  Date of Birth: 1991/02/10      BSA:     1.581 m  Patient Age:  29 years       BP:      89/64 mmHg  Patient Gender: F          HR:      64 bpm.  Exam Location: [address]   Procedure: 2D Echo, Cardiac Doppler and Color Doppler   Indications:  Z95.2 Status post Aortic valve replacement    History:    Patient has prior history of Echocardiogram examinations,  most         recent 11/18/2019. Aortic Valve  Replacement-21 mm  Carbomedics top         Hat Bileaflet.    Sonographer:  Sedonia Small Rodgers-Jones RDCS  Referring Phys: [id number] Jalexa Pifer H Jovaughn Wojtaszek   IMPRESSIONS    1. Left ventricular ejection fraction, by estimation, is 60 to 65%. The  left ventricle has normal function. The left ventricle has no regional  wall motion abnormalities. Left ventricular diastolic parameters were  normal.  2. Right ventricular systolic function is normal. The right ventricular  size is normal.  3. The mitral valve is grossly normal. Trivial mitral valve  regurgitation. No evidence of mitral stenosis.  4. The aortic valve has been repaired/replaced. Aortic valve  regurgitation is trivial. No aortic stenosis is present.   FINDINGS  Left Ventricle: Left ventricular ejection fraction, by estimation, is 60  to 65%. The left ventricle has normal function. The left ventricle has no  regional wall motion abnormalities. The left ventricular internal cavity  size was normal in size. There is  no left ventricular hypertrophy. Left ventricular diastolic parameters  were normal.   Right Ventricle: The right ventricular size is normal. No increase in  right ventricular wall thickness. Right ventricular systolic function is  normal.   Left Atrium: Left atrial size was normal in size.   Right Atrium: Right atrial size was normal in size.   Pericardium: There is no evidence of pericardial effusion.   Mitral Valve: The mitral valve is grossly normal. Trivial mitral valve  regurgitation. No evidence of mitral valve stenosis.   Tricuspid Valve: The tricuspid valve is normal in structure. Tricuspid  valve regurgitation is trivial. No evidence of tricuspid stenosis.   Aortic Valve: The aortic valve has been repaired/replaced. Aortic valve  regurgitation is trivial. Aortic regurgitation PHT measures 463 msec. No  aortic stenosis is present. Aortic valve mean gradient measures 11.8 mmHg.  Aortic valve  peak gradient  measures 17.6 mmHg.   Pulmonic Valve: The pulmonic valve was normal in structure. Pulmonic valve  regurgitation is not visualized.   Aorta: The aortic root and ascending aorta are structurally normal, with  no evidence of dilitation.   IAS/Shunts: The atrial septum is grossly normal.     LEFT VENTRICLE  PLAX 2D  LVIDd:     4.10 cm Diastology  LVIDs:     2.50 cm LV e' medial:  7.29 cm/s  LV PW:     0.60 cm LV E/e' medial: 19.3  LV IVS:    0.60 cm LV e' lateral:  13.70 cm/s             LV E/e' lateral: 10.3  RIGHT VENTRICLE      IVC  RV Basal diam: 3.70 cm  IVC diam: 1.50 cm  RV S prime:   9.74 cm/s  TAPSE (M-mode): 1.8 cm   LEFT ATRIUM       Index    RIGHT ATRIUM      Index  LA diam:    3.80 cm 2.40 cm/m RA Area:   11.20 cm  LA Vol (A2C):  29.4 ml 18.59 ml/m RA Volume:  24.50 ml 15.49 ml/m  LA Vol (A4C):  30.4 ml 19.22 ml/m  LA Biplane Vol: 30.8 ml 19.48 ml/m  AORTIC VALVE  AV Vmax:      209.60 cm/s  AV Vmean:     162.200 cm/s  AV VTI:      0.385 m  AV Peak Grad:   17.6 mmHg  AV Mean Grad:   11.8 mmHg  LVOT Vmax:     103.50 cm/s  LVOT Vmean:    74.600 cm/s  LVOT VTI:     0.222 m  LVOT/AV VTI ratio: 0.58  AI PHT:      463 msec    AORTA  Ao Root diam: 3.40 cm  Ao Asc diam: 3.50 cm   MITRAL VALVE        TRICUSPID VALVE  MV Area (PHT): 4.89 cm   TR Peak grad:  11.2 mmHg  MV Decel Time: 155 msec   TR Vmax:    167.00 cm/s  MV E velocity: 141.00 cm/s  MV A velocity: 48.00 cm/s  SHUNTS  MV E/A ratio: 2.94     Systemic VTI: 0.22 m   Kristeen Miss MD  Electronically signed by Kristeen Miss MD  Signature Date/Time: 12/09/2019/1:58:49 PM      Impression:  Patient is doing very well nearly 3 months status post aortic valve replacement using a mechanical prosthetic valve.   Plan:  We have not recommended  any change the patient's current medications.  Given her borderline low blood pressure and history of dizziness I do not feel that resuming beta-blocker is necessary at this time.  The patient may increase her physical activity without any particular limitations.  I think she could return to work.  The patient has been reminded regarding the importance of dental hygiene and the lifelong need for antibiotic prophylaxis for all dental cleanings and other related invasive procedures.  The patient will continue to follow-up intermittently with Dr. Eden Emms and return to our office for routine follow-up next fall, approximately 1 year following her surgery.      Salvatore Decent. Cornelius Moras, MD 01/19/2020 3:19 PM

## 2020-01-19 NOTE — Patient Instructions (Signed)

## 2020-01-20 ENCOUNTER — Other Ambulatory Visit: Payer: Self-pay | Admitting: Cardiovascular Disease

## 2020-01-21 ENCOUNTER — Encounter (HOSPITAL_COMMUNITY)
Admission: RE | Admit: 2020-01-21 | Discharge: 2020-01-21 | Disposition: A | Discharge status: Home / Self Care | Payer: BC Managed Care – PPO | Source: Ambulatory Visit | Attending: Cardiovascular Disease | Admitting: Cardiovascular Disease

## 2020-01-21 ENCOUNTER — Other Ambulatory Visit: Payer: Self-pay

## 2020-01-21 DIAGNOSIS — Z954 Presence of other heart-valve replacement: Secondary | ICD-10-CM | POA: Diagnosis not present

## 2020-01-22 NOTE — Progress Notes (Signed)
CARDIOLOGY CONSULT NOTE       Patient ID: Patricia Frederick MRN: [id number] DOB/AGE: [birth year] 29 y.o.  Referring Physician: Fayrene Helper PA-C  Primary Physician: Patient, No Pcp Per Primary Cardiologist: Eden Emms Reason for Consultation: Aortic Stenosis / Chest Pain   Active Problems:   * No active hospital problems. *  Interpretor  Used through out exam as patient does not speak english   HPI: 29 y.o. [place] female f/u AVR. First seen May 2021 noted to have severe AS with mean gradient 49 mmHg peak 93 mmHg  Cardiac CT done 09/03/19 with calcium score 0 normal right dominant cors Aortic root 3.8 cm   Had 21 mm Sorin Carbomedics Top Hat bileaflet valve with Nicks procedure bovine pericardial patch root enlargement with Dr Cornelius Moras 10/15/19.  Did well post op. Started on coumadin 4 mg INR only 1.3 on d/c 10/19/19 D/C with coumadin, iron, lopressor 12.5 mg bid and 81 mg ASA  INR have been normal to low   TTE 12/09/19 with EF 60-65% mean gradient 11 peak 17 mmHg with trivial AR   Doing much better Has two children 8/11 looking forward to Christmas    ROS All other systems reviewed and negative except as noted above  Past Medical History:  Diagnosis Date  . Anemia   . Aortic stenosis, severe    echo 9/12: EF 55-65%, severe AS, mean gradient 49 mmHg, unicuspid Aortic Valve  . 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 Rheumatic heart disease 2011  . S/P aortic valve replacement with mechanical valve 10/15/2019   21 mm Carbomedics Top Hat bileaflet mechanical valve with aortic root enlargement  . Tachycardia     Family History  Problem Relation Age of Onset  . Stroke Neg Hx   . Cancer Neg Hx   . Asthma Other   . Anesthesia problems Neg Hx     Social History   Socioeconomic History  . Marital status: Married    Spouse name: Not on file  . Number of children: Not on file  . Years of education: Not on file  . Highest education level: Not on file  Occupational History   . Not on file  Tobacco Use  . Smoking status: Never Smoker  . Smokeless tobacco: Never Used  Vaping Use  . Vaping Use: Never used  Substance and Sexual Activity  . Alcohol use: Never  . Drug use: Never  . Sexual activity: Yes  Other Topics Concern  . Not on file  Social History Narrative   ** Merged History Encounter **       Social Determinants of Health   Financial Resource Strain: Not on file  Food Insecurity: Not on file  Transportation Needs: Not on file  Physical Activity: Not on file  Stress: Not on file  Social Connections: Not on file  Intimate Partner Violence: Not on file    Past Surgical History:  Procedure Laterality Date  . AORTIC VALVE REPLACEMENT N/A 10/15/2019   Procedure: AORTIC VALVE REPLACEMENT (AVR) WITH CARBOMEDICS SUPRA-ANNULAR (TOP HAT) 21 VALVE.;  Surgeon: Purcell Nails, MD;  Location: [facility] OR;  Service: Open Heart Surgery;  Laterality: N/A;  . ASCENDING AORTIC ROOT REPLACEMENT N/A 10/15/2019   Procedure: ASCENDING AORTIC ROOT ENLARGEMENT WITH PERI-GUARD PATCH.;  Surgeon: Purcell Nails, MD;  Location: [facility] OR;  Service: Open Heart Surgery;  Laterality: N/A;  . CESAREAN SECTION    . EYE SURGERY  per patient, had a mass above her eye and it was removed maybe in  2017/2018  . NO PAST SURGERIES    . TEE WITHOUT CARDIOVERSION N/A 10/15/2019   Procedure: TRANSESOPHAGEAL ECHOCARDIOGRAM (TEE);  Surgeon: Purcell Nails, MD;  Location: [facility] OR;  Service: Open Heart Surgery;  Laterality: N/A;      Current Outpatient Medications:  .  acetaminophen (TYLENOL) 500 MG tablet, Take 500 mg by mouth every 6 (six) hours as needed for mild pain., Disp: , Rfl:  .  warfarin (COUMADIN) 2 MG tablet, TAKE 3 TO 4 TABLETS BY MOUTH EVERY DAY AS DIRECTED BY COUMADIN CLINIC, Disp: 100 tablet, Rfl: 1    Physical Exam: Blood pressure 102/72, pulse 79, height 5\' 2"  (1.575 m), weight 57.6 kg, SpO2 97 %.    Affect appropriate Healthy:  appears stated age HEENT: normal Neck  supple with no adenopathy JVP normal no bruits no thyromegaly Lungs clear with no wheezing and good diaphragmatic motion Heart:  S1/S2 click no AR   murmur, no rub, gallop or click PMI normal Abdomen: benighn, BS positve, no tenderness, no AAA no bruit.  No HSM or HJR Distal pulses intact with no bruits No edema Neuro non-focal Skin warm and dry No muscular weakness   Labs:   Lab Results  Component Value Date   WBC 11.0 (H) 10/18/2019   HGB 7.5 (L) 10/18/2019   HCT 23.6 (L) 10/18/2019   MCV 75.6 (L) 10/18/2019   PLT 169 10/18/2019     Radiology: No results found.  EKG: SR rate 72 normal 06/30/19    ASSESSMENT AND PLAN:   1. AVR:  Post op echo 12/09/19 acceptable with trivial AR and mean gradient 11 mmHg SBE prophylaxis f/u in Coumadin clinic INRls need to be a bit higher  2. BP:  Has been low beta blocker d/c encouraged hydration   F/U in a year  F/U in coumadin clinic regularly    Signed: 12/11/19 01/26/2020, 2:15 PM

## 2020-01-23 ENCOUNTER — Encounter (HOSPITAL_COMMUNITY)
Admission: RE | Admit: 2020-01-23 | Discharge: 2020-01-23 | Disposition: A | Discharge status: Home / Self Care | Payer: BC Managed Care – PPO | Source: Ambulatory Visit | Attending: Cardiovascular Disease | Admitting: Cardiovascular Disease

## 2020-01-23 ENCOUNTER — Other Ambulatory Visit: Payer: Self-pay

## 2020-01-23 VITALS — Ht 58.25 in | Wt 127.4 lb

## 2020-01-23 DIAGNOSIS — Z954 Presence of other heart-valve replacement: Secondary | ICD-10-CM

## 2020-01-23 NOTE — Progress Notes (Signed)
Discharge Progress Report  Patient Details  Name: Patricia Frederick MRN: [id number] Date of Birth: [birth year] Referring Provider:   Flowsheet Row CARDIAC REHAB PHASE II ORIENTATION from 12/18/2019 in [facility]  Referring Provider Jenkins Rouge, MD       Number of Visits: 13  Reason for Discharge:  Early Exit:  Back to work  Smoking History:  Social History   Tobacco Use  Smoking Status Never Smoker  Smokeless Tobacco Never Used    Diagnosis:  S/P aortic valve replacement with mechanical valve 10/15/19  ADL UCSD:   Initial Exercise Prescription:  Initial Exercise Prescription - 12/18/19 1500      Date of Initial Exercise RX and Referring Provider   Date 12/18/19    Referring Provider Jenkins Rouge, MD    Expected Discharge Date 02/11/20      Recumbant Bike   Level 1.5    Minutes 15    METs 2      NuStep   Level 2    SPM 75    Minutes 15    METs 1.8      Prescription Details   Frequency (times per week) 3    Duration Progress to 30 minutes of continuous aerobic without signs/symptoms of physical distress      Intensity   THRR 40-80% of Max Heartrate 76-152    Ratings of Perceived Exertion 11-13    Perceived Dyspnea 0-4      Progression   Progression Continue progressive overload as per policy without signs/symptoms or physical distress.      Resistance Training   Training Prescription Yes    Weight 2 lbs    Reps 10-15           Discharge Exercise Prescription (Final Exercise Prescription Changes):  Exercise Prescription Changes - 12/31/19 1630      Response to Exercise   Blood Pressure (Admit) 108/70    Blood Pressure (Exercise) 104/70    Blood Pressure (Exit) 98/64    Heart Rate (Admit) 99 bpm    Heart Rate (Exercise) 141 bpm    Heart Rate (Exit) 99 bpm    Rating of Perceived Exertion (Exercise) 13    Symptoms None    Comments Reviewed home exercise Rx with patient/translator    Duration Continue with 30  min of aerobic exercise without signs/symptoms of physical distress.    Intensity THRR unchanged      Progression   Progression Continue to progress workloads to maintain intensity without signs/symptoms of physical distress.    Average METs 3.9      Resistance Training   Training Prescription Yes    Weight 2 lbs    Reps 10-15    Time 10 Minutes      Interval Training   Interval Training No      NuStep   Level 3    SPM 100    Minutes 30    METs 3.9      Home Exercise Plan   Plans to continue exercise at Home (comment)    Frequency Add 3 additional days to program exercise sessions.    Initial Home Exercises Provided 12/31/19           Functional Capacity:  6 Minute Walk    Row Name 12/18/19 1524 01/23/20 1525       6 Minute Walk   Phase Initial Discharge    Distance 1227 feet 1459 feet    Distance % Change -- 18.91 %  Distance Feet Change -- 232 ft    Walk Time 6 minutes 6 minutes    # of Rest Breaks 0 0    MPH 2.3 2.76    METS 4.76 5.95    RPE 7 7    Perceived Dyspnea  0 0    VO2 Peak 16.67 20.83    Symptoms No No    Resting HR 98 bpm 83 bpm    Resting BP 98/78 108/74    Resting Oxygen Saturation  100 % --    Exercise Oxygen Saturation  during 6 min walk 99 % --    Max Ex. HR 111 bpm 114 bpm    Max Ex. BP 112/70 130/80    2 Minute Post BP 104/60 --           Psychological, QOL, Others - Outcomes: PHQ 2/9: Depression screen [facility] 2/9 01/23/2020 12/18/2019 12/18/2019 07/10/2018  Decreased Interest 0 0 0 0  Down, Depressed, Hopeless 0 0 0 0  PHQ - 2 Score 0 0 0 0  Altered sleeping - - - 0  Tired, decreased energy - - - 0  Change in appetite - - - 0  Feeling bad or failure about yourself  - - - 0  Trouble concentrating - - - 0  Moving slowly or fidgety/restless - - - 0  Suicidal thoughts - - - 0  PHQ-9 Score - - - 0    Quality of Life:  Quality of Life - 12/18/19 1523      Quality of Life   Select Quality of Life      Quality of Life Scores    Health/Function Pre 27.43 %    Socioeconomic Pre 30 %    Psych/Spiritual Pre 27.43 %    Family Pre 26.4 %    GLOBAL Pre 27.75 %           Personal Goals: Goals established at orientation with interventions provided to work toward goal.  Personal Goals and Risk Factors at Admission - 12/18/19 1522      Core Components/Risk Factors/Patient Goals on Admission    Weight Management Yes;Weight Loss    Intervention Weight Management: Develop a combined nutrition and exercise program designed to reach desired caloric intake, while maintaining appropriate intake of nutrient and fiber, sodium and fats, and appropriate energy expenditure required for the weight goal.;Weight Management: Provide education and appropriate resources to help participant work on and attain dietary goals.;Weight Management/Obesity: Establish reasonable short term and long term weight goals.    Admit Weight 128 lb 4.9 oz (58.2 kg)    Expected Outcomes Short Term: Continue to assess and modify interventions until short term weight is achieved;Long Term: Adherence to nutrition and physical activity/exercise program aimed toward attainment of established weight goal;Weight Maintenance: Understanding of the daily nutrition guidelines, which includes 25-35% calories from fat, 7% or less cal from saturated fats, less than 242m cholesterol, less than 1.5gm of sodium, & 5 or more servings of fruits and vegetables daily;Weight Loss: Understanding of general recommendations for a balanced deficit meal plan, which promotes 1-2 lb weight loss per week and includes a negative energy balance of [id number] kcal/d;Understanding recommendations for meals to include 15-35% energy as protein, 25-35% energy from fat, 35-60% energy from carbohydrates, less than 2063mof dietary cholesterol, 20-35 gm of total fiber daily;Understanding of distribution of calorie intake throughout the day with the consumption of 4-5 meals/snacks            Personal  Goals  Discharge:  Goals and Risk Factor Review    Row Name 01/06/20 1511 01/23/20 1550           Core Components/Risk Factors/Patient Goals Review   Personal Goals Review Weight Management/Obesity Weight Management/Obesity      Review Abisai Coble is doing well with exercise. Eulala Maya's vital signs have been stable. Jackqulyn Maya cmpltes cardiac reahb today 01/1020 as she is returning to work on Monday      Expected [place] will continue to participaiton in phase 2 cardiac rehab for exercise nutrition and lifestyle modifications. Danissa Maya will continue exercise by walking upon completion of phase 2 cardiac rehab             Exercise Goals and Review:  Exercise Goals    Row Name 12/18/19 1526             Exercise Goals   Increase Physical Activity Yes       Intervention Provide advice, education, support and counseling about physical activity/exercise needs.;Develop an individualized exercise prescription for aerobic and resistive training based on initial evaluation findings, risk stratification, comorbidities and participant's personal goals.       Expected Outcomes Short Term: Attend rehab on a regular basis to increase amount of physical activity.;Long Term: Add in home exercise to make exercise part of routine and to increase amount of physical activity.;Long Term: Exercising regularly at least 3-5 days a week.       Increase Strength and Stamina Yes       Intervention Provide advice, education, support and counseling about physical activity/exercise needs.;Develop an individualized exercise prescription for aerobic and resistive training based on initial evaluation findings, risk stratification, comorbidities and participant's personal goals.       Expected Outcomes Short Term: Increase workloads from initial exercise prescription for resistance, speed, and METs.;Short Term: Perform resistance training exercises routinely during rehab and add in resistance training at  home;Long Term: Improve cardiorespiratory fitness, muscular endurance and strength as measured by increased METs and functional capacity (6MWT)       Able to understand and use rate of perceived exertion (RPE) scale Yes       Intervention Provide education and explanation on how to use RPE scale       Expected Outcomes Short Term: Able to use RPE daily in rehab to express subjective intensity level;Long Term:  Able to use RPE to guide intensity level when exercising independently       Knowledge and understanding of Target Heart Rate Range (THRR) Yes       Intervention Provide education and explanation of THRR including how the numbers were predicted and where they are located for reference       Expected Outcomes Short Term: Able to state/look up THRR;Short Term: Able to use daily as guideline for intensity in rehab;Long Term: Able to use THRR to govern intensity when exercising independently       Understanding of Exercise Prescription Yes       Intervention Provide education, explanation, and written materials on patient's individual exercise prescription       Expected Outcomes Short Term: Able to explain program exercise prescription;Long Term: Able to explain home exercise prescription to exercise independently              Exercise Goals Re-Evaluation:  Exercise Goals Re-Evaluation    Row Name 12/22/19 1627 01/05/20 1426 01/23/20 1630         Exercise Goal Re-Evaluation   Exercise Goals Review  Increase Physical Activity;Increase Strength and Stamina;Able to understand and use rate of perceived exertion (RPE) scale;Knowledge and understanding of Target Heart Rate Range (THRR);Understanding of Exercise Prescription Increase Physical Activity;Increase Strength and Stamina;Able to understand and use rate of perceived exertion (RPE) scale;Knowledge and understanding of Target Heart Rate Range (THRR);Able to check pulse independently;Understanding of Exercise Prescription Increase Physical  Activity;Increase Strength and Stamina;Able to understand and use rate of perceived exertion (RPE) scale;Knowledge and understanding of Target Heart Rate Range (THRR);Able to check pulse independently;Understanding of Exercise Prescription     Comments Pt's first day of exercise in the CRP2 program. Pt tolerated the exercise session well and understands the exercise Rx and the RPE scale. Reviewed home exercise Rx with patient with the help of translator. Per pt, there is a family member at home who reads english. Pt given a copy of the exercise Rx in [place]. Pt verbalized understanding of home exercise Rx through translator, Pt will walk at home 2-3x/week 30 minutes. Pt graduated form the [place] program as she has to return to work next week. PT made good progress in the CRP2program and had an average MET level of 4.1. The pt plans to continue her exercise at home by walking 5-7x/week for 30-45 minutes.     Expected Outcomes Will continue to monitor and progress patient as tolerated. Pt will walk at home for 30 minutes 2-3x/week. Pt will continue her exercise by walking at home.            Nutrition & Weight - Outcomes:  Pre Biometrics - 12/18/19 1330      Pre Biometrics   Waist Circumference 36.5 inches    Hip Circumference 38 inches    Waist to Hip Ratio 0.96 %    Triceps Skinfold 20 mm    % Body Fat 35.4 %    Grip Strength 28 kg    Flexibility 17.75 in    Single Leg Stand 30 seconds           Post Biometrics - 01/23/20 1620       Post  Biometrics   Height 4' 10.25" (1.48 m)    Weight 57.8 kg    Waist Circumference 36 inches    Hip Circumference 38 inches    Waist to Hip Ratio 0.95 %    BMI (Calculated) 26.39    Triceps Skinfold 21 mm    % Body Fat 35.3 %    Grip Strength 30 kg    Flexibility 15 in    Single Leg Stand 30 seconds           Nutrition:  Nutrition Therapy & Goals - 12/26/19 1539      Nutrition Therapy   Diet Heart healthy/low sodium    Drug/Food  Interactions Coumadin/Vit K      Personal Nutrition Goals   Nutrition Goal Pt to build a healthy plate including vegetables, fruits, whole grains, and low-fat dairy products in a heart healthy meal plan      Intervention Plan   Intervention Prescribe, educate and counsel regarding individualized specific dietary modifications aiming towards targeted core components such as weight, hypertension, lipid management, diabetes, heart failure and other comorbidities.;Nutrition handout(s) given to patient.    Expected Outcomes Short Term Goal: Understand basic principles of dietary content, such as calories, fat, sodium, cholesterol and nutrients.           Nutrition Discharge:   Education Questionnaire Score:  Knowledge Questionnaire Score - 12/18/19 1521  Knowledge Questionnaire Score   Pre Score 16/24           Goals reviewed with patient; copy given to patient.Pt graduated from cardiac rehab program on 01/23/20  with completion of 13 exercise sessions in Phase II. Pt maintained good attendance and progressed nicely during his participation in rehab as evidenced by increased MET level.   Medication list reconciled. Repeat  PHQ score- 0 .  Pt has made significant lifestyle changes and should be commended for her success. Pt feels she has achieved her goals during cardiac rehab.   Pt plans to continue exercise by walking 30 minutes a day.[place] completed cardiac rehab early to return to work. Barnet Pall, RN,BSN 02/18/2020 3:21 PM

## 2020-01-26 ENCOUNTER — Ambulatory Visit (INDEPENDENT_AMBULATORY_CARE_PROVIDER_SITE_OTHER): Payer: BC Managed Care – PPO | Admitting: *Deleted

## 2020-01-26 ENCOUNTER — Ambulatory Visit (INDEPENDENT_AMBULATORY_CARE_PROVIDER_SITE_OTHER): Payer: BC Managed Care – PPO | Admitting: Cardiovascular Disease

## 2020-01-26 ENCOUNTER — Other Ambulatory Visit: Payer: Self-pay

## 2020-01-26 ENCOUNTER — Encounter (HOSPITAL_COMMUNITY): Payer: BC Managed Care – PPO

## 2020-01-26 ENCOUNTER — Encounter: Payer: Self-pay | Admitting: Cardiovascular Disease

## 2020-01-26 VITALS — BP 102/72 | HR 79 | Ht 62.0 in | Wt 127.0 lb

## 2020-01-26 DIAGNOSIS — Z5181 Encounter for therapeutic drug level monitoring: Secondary | ICD-10-CM | POA: Diagnosis not present

## 2020-01-26 DIAGNOSIS — Z952 Presence of prosthetic heart valve: Secondary | ICD-10-CM

## 2020-01-26 LAB — POCT INR: INR: 1.6 — AB (ref 2.0–3.0)

## 2020-01-26 NOTE — Patient Instructions (Signed)

## 2020-01-26 NOTE — Patient Instructions (Signed)
Description    Take 5 tablets today and 4 tablets tomorrow, then start taking 3 tablets daily except for 4 tablet on Monday, Wednesday and Friday. Recheck INR in 10 days.   C[facility]madin Clinic [phone].

## 2020-01-28 ENCOUNTER — Encounter (HOSPITAL_COMMUNITY): Payer: BC Managed Care – PPO

## 2020-01-30 ENCOUNTER — Encounter (HOSPITAL_COMMUNITY): Payer: BC Managed Care – PPO

## 2020-02-02 ENCOUNTER — Encounter (HOSPITAL_COMMUNITY): Payer: BC Managed Care – PPO

## 2020-02-04 ENCOUNTER — Encounter (HOSPITAL_COMMUNITY): Payer: BC Managed Care – PPO

## 2020-02-05 ENCOUNTER — Other Ambulatory Visit: Payer: Self-pay

## 2020-02-05 ENCOUNTER — Ambulatory Visit (INDEPENDENT_AMBULATORY_CARE_PROVIDER_SITE_OTHER): Payer: BC Managed Care – PPO | Admitting: *Deleted

## 2020-02-05 DIAGNOSIS — Z7901 Long term (current) use of anticoagulants: Secondary | ICD-10-CM

## 2020-02-05 DIAGNOSIS — Z952 Presence of prosthetic heart valve: Secondary | ICD-10-CM

## 2020-02-05 DIAGNOSIS — Z5181 Encounter for therapeutic drug level monitoring: Secondary | ICD-10-CM

## 2020-02-05 DIAGNOSIS — Z954 Presence of other heart-valve replacement: Secondary | ICD-10-CM | POA: Diagnosis not present

## 2020-02-05 LAB — POCT INR: INR: 1.9 — AB (ref 2.0–3.0)

## 2020-02-05 NOTE — Patient Instructions (Addendum)
  Description   Take 4 tablets today and 5 tablets tomorrow, then start taking 4 tablets daily except for 3 tablets on Tuesday and Saturday. Recheck INR in 10 days.   C[facility]madin Clinic [phone].

## 2020-02-09 ENCOUNTER — Encounter (HOSPITAL_COMMUNITY): Payer: BC Managed Care – PPO

## 2020-02-11 ENCOUNTER — Encounter (HOSPITAL_COMMUNITY): Payer: BC Managed Care – PPO

## 2020-02-17 ENCOUNTER — Ambulatory Visit (INDEPENDENT_AMBULATORY_CARE_PROVIDER_SITE_OTHER): Payer: BC Managed Care – PPO | Admitting: *Deleted

## 2020-02-17 ENCOUNTER — Other Ambulatory Visit: Payer: Self-pay

## 2020-02-17 DIAGNOSIS — Z952 Presence of prosthetic heart valve: Secondary | ICD-10-CM | POA: Diagnosis not present

## 2020-02-17 DIAGNOSIS — Z954 Presence of other heart-valve replacement: Secondary | ICD-10-CM | POA: Diagnosis not present

## 2020-02-17 DIAGNOSIS — Z7901 Long term (current) use of anticoagulants: Secondary | ICD-10-CM

## 2020-02-17 DIAGNOSIS — Z5181 Encounter for therapeutic drug level monitoring: Secondary | ICD-10-CM

## 2020-02-17 LAB — POCT INR: INR: 2.2 (ref 2.0–3.0)

## 2020-02-17 NOTE — Patient Instructions (Signed)
Description   Start taking 4 tablets daily. Recheck INR in 2 weeks. Coumadin Clinic [phone].

## 2020-02-18 NOTE — Addendum Note (Signed)
Encounter addended by: Cammy Copa, RN on: 02/18/2020 3:34 PM  Actions taken: Clinical Note Signed

## 2020-02-20 ENCOUNTER — Other Ambulatory Visit: Payer: Self-pay

## 2020-02-20 MED ORDER — WARFARIN SODIUM 2 MG PO TABS
ORAL_TABLET | ORAL | 1 refills | Status: DC
Start: 2020-02-20 — End: 2020-03-05

## 2020-03-05 ENCOUNTER — Other Ambulatory Visit: Payer: Self-pay

## 2020-03-05 ENCOUNTER — Ambulatory Visit (INDEPENDENT_AMBULATORY_CARE_PROVIDER_SITE_OTHER): Payer: BC Managed Care – PPO | Admitting: *Deleted

## 2020-03-05 DIAGNOSIS — Z7901 Long term (current) use of anticoagulants: Secondary | ICD-10-CM

## 2020-03-05 DIAGNOSIS — Z5181 Encounter for therapeutic drug level monitoring: Secondary | ICD-10-CM

## 2020-03-05 DIAGNOSIS — Z954 Presence of other heart-valve replacement: Secondary | ICD-10-CM

## 2020-03-05 DIAGNOSIS — Z952 Presence of prosthetic heart valve: Secondary | ICD-10-CM

## 2020-03-05 LAB — POCT INR: INR: 2.3 (ref 2.0–3.0)

## 2020-03-05 MED ORDER — WARFARIN SODIUM 2 MG PO TABS
ORAL_TABLET | ORAL | 1 refills | Status: DC
Start: 2020-03-05 — End: 2020-04-02

## 2020-03-05 NOTE — Patient Instructions (Signed)
Description   Start taking 4 tablets daily except 5 tablets on Fridays. Recheck INR in 2 weeks. Coumadin Clinic [phone].

## 2020-03-19 ENCOUNTER — Other Ambulatory Visit: Payer: Self-pay

## 2020-03-19 ENCOUNTER — Ambulatory Visit (INDEPENDENT_AMBULATORY_CARE_PROVIDER_SITE_OTHER): Payer: Self-pay

## 2020-03-19 DIAGNOSIS — Z954 Presence of other heart-valve replacement: Secondary | ICD-10-CM

## 2020-03-19 DIAGNOSIS — Z952 Presence of prosthetic heart valve: Secondary | ICD-10-CM

## 2020-03-19 DIAGNOSIS — Z7901 Long term (current) use of anticoagulants: Secondary | ICD-10-CM

## 2020-03-19 DIAGNOSIS — Z5181 Encounter for therapeutic drug level monitoring: Secondary | ICD-10-CM

## 2020-03-19 LAB — POCT INR: INR: 2.2 (ref 2.0–3.0)

## 2020-03-19 NOTE — Patient Instructions (Signed)
-   Take 5 tablets today and tomorrow, then  - continue taking 4 tablets daily except 5 tablets on Fridays.  - Recheck INR in 2 weeks.  C[facility]madin Clinic [phone].

## 2020-04-02 ENCOUNTER — Ambulatory Visit (INDEPENDENT_AMBULATORY_CARE_PROVIDER_SITE_OTHER): Payer: Self-pay | Admitting: *Deleted

## 2020-04-02 ENCOUNTER — Other Ambulatory Visit: Payer: Self-pay

## 2020-04-02 DIAGNOSIS — Z954 Presence of other heart-valve replacement: Secondary | ICD-10-CM

## 2020-04-02 DIAGNOSIS — Z5181 Encounter for therapeutic drug level monitoring: Secondary | ICD-10-CM

## 2020-04-02 DIAGNOSIS — Z952 Presence of prosthetic heart valve: Secondary | ICD-10-CM

## 2020-04-02 DIAGNOSIS — Z7901 Long term (current) use of anticoagulants: Secondary | ICD-10-CM

## 2020-04-02 LAB — POCT INR: INR: 2.1 (ref 2.0–3.0)

## 2020-04-02 MED ORDER — WARFARIN SODIUM 2 MG PO TABS: ORAL_TABLET | ORAL | 1 refills | Status: DC

## 2020-04-02 NOTE — Patient Instructions (Signed)
Description   Today take 6 tablets and tomorrow take 5 tablets  then start taking 4 tablets daily except 5 tablets on Mondays, Wednesdays, and Fridays. Recheck INR in 2 weeks. Coumadin Clinic [phone].

## 2020-04-06 NOTE — Progress Notes (Unsigned)
CARDIOLOGY CONSULT NOTE       Patient ID: Patricia Frederick MRN: [id number] DOB/AGE: Jan 26, 1991 30 y.o.  Referring Physician: Fayrene Helper PA-C  Primary Physician: Patient, No Pcp Per Primary Cardiologist: Eden Emms Reason for Consultation: Aortic Stenosis / Chest Pain   Active Problems:   * No active hospital problems. *  Interpretor  Used through out exam as patient does not speak english   HPI: 30 y.o. [place] female f/u AVR. First seen May 2021 noted to have severe AS with mean gradient 49 mmHg peak 93 mmHg  Cardiac CT done 09/03/19 with calcium score 0 normal right dominant cors Aortic root 3.8 cm   Had 21 mm Sorin Carbomedics Top Hat bileaflet valve with Nicks procedure bovine pericardial patch root enlargement with Dr Cornelius Moras 10/15/19.  Did well post op D/C with coumadin, iron, lopressor 12.5 mg bid and 81 mg ASA    TTE 12/09/19 with EF 60-65% mean gradient 11 peak 17 mmHg with trivial AR   INR have been Rx with no bleeding issues  Has two children 8/11   Having muscular sounding left arm and side pain since surgery   ROS All other systems reviewed and negative except as noted above  Past Medical History:  Diagnosis Date  . Anemia   . Aortic stenosis, severe    echo 9/12: EF 55-65%, severe AS, mean gradient 49 mmHg, unicuspid Aortic Valve  . 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 Rheumatic heart disease 2011  . S/P aortic valve replacement with mechanical valve 10/15/2019   21 mm Carbomedics Top Hat bileaflet mechanical valve with aortic root enlargement  . Tachycardia     Family History  Problem Relation Age of Onset  . Stroke Neg Hx   . Cancer Neg Hx   . Asthma Other   . Anesthesia problems Neg Hx     Social History   Socioeconomic History  . Marital status: Married    Spouse name: Not on file  . Number of children: Not on file  . Years of education: Not on file  . Highest education level: Not on file  Occupational History  . Not on file   Tobacco Use  . Smoking status: Never Smoker  . Smokeless tobacco: Never Used  Vaping Use  . Vaping Use: Never used  Substance and Sexual Activity  . Alcohol use: Never  . Drug use: Never  . Sexual activity: Yes  Other Topics Concern  . Not on file  Social History Narrative   ** Merged History Encounter **       Social Determinants of Health   Financial Resource Strain: Not on file  Food Insecurity: Not on file  Transportation Needs: Not on file  Physical Activity: Not on file  Stress: Not on file  Social Connections: Not on file  Intimate Partner Violence: Not on file    Past Surgical History:  Procedure Laterality Date  . AORTIC VALVE REPLACEMENT N/A 10/15/2019   Procedure: AORTIC VALVE REPLACEMENT (AVR) WITH CARBOMEDICS SUPRA-ANNULAR (TOP HAT) 21 VALVE.;  Surgeon: Purcell Nails, MD;  Location: [facility] OR;  Service: Open Heart Surgery;  Laterality: N/A;  . ASCENDING AORTIC ROOT REPLACEMENT N/A 10/15/2019   Procedure: ASCENDING AORTIC ROOT ENLARGEMENT WITH PERI-GUARD PATCH.;  Surgeon: Purcell Nails, MD;  Location: [facility] OR;  Service: Open Heart Surgery;  Laterality: N/A;  . CESAREAN SECTION    . EYE SURGERY     per patient, had a  mass above her eye and it was removed maybe in  2017/2018  . NO PAST SURGERIES    . TEE WITHOUT CARDIOVERSION N/A 10/15/2019   Procedure: TRANSESOPHAGEAL ECHOCARDIOGRAM (TEE);  Surgeon: Purcell Nails, MD;  Location[facility] OR;  Service: Open Heart Surgery;  Laterality: N/A;      Current Outpatient Medications:  .  acetaminophen (TYLENOL) 500 MG tablet, Take 500 mg by mouth every 6 (six) hours as needed for mild pain., Disp: , Rfl:  .  warfarin (COUMADIN) 2 MG tablet, TAKE 4 TO 5 TABLETS BY MOUTH EVERY DAY AS DIRECTED BY COUMADIN CLINIC, Disp: 135 tablet, Rfl: 1    Physical Exam: Blood pressure 100/62, pulse 80, height 5\' 2"  (1.575 m), weight 59.4 kg, SpO2 98 %.    Affect appropriate Healthy:  appears stated age HEENT: normal Neck supple with no  adenopathy JVP normal no bruits no thyromegaly Lungs clear with no wheezing and good diaphragmatic motion Heart:  S1/S2 click no AR   murmur, no rub, gallop or click PMI normal Abdomen: benighn, BS positve, no tenderness, no AAA no bruit.  No HSM or HJR Distal pulses intact with no bruits No edema Neuro non-focal Skin warm and dry No muscular weakness Prominent venous pattern over left bicep    Labs:   Lab Results  Component Value Date   WBC 11.0 (H) 10/18/2019   HGB 7.5 (L) 10/18/2019   HCT 23.6 (L) 10/18/2019   MCV 75.6 (L) 10/18/2019   PLT 169 10/18/2019     Radiology: No results found.  EKG: SR rate 72 normal 06/30/19    ASSESSMENT AND PLAN:   1. AVR:  Post op echo 12/09/19 acceptable with trivial AR and mean gradient 11 mmHg SBE prophylaxis f/u in Coumadin clinic INRls need to be a bit higher  2. BP:  Has been low beta blocker d/c encouraged hydration  3. Left arm pain:  LUE venous duplex r/o thrombus given prominent venous pattern in that arm   F/U in a year  F/U in coumadin clinic regularly    Signed: 12/11/19 04/08/2020, 3:18 PM

## 2020-04-08 ENCOUNTER — Other Ambulatory Visit: Payer: Self-pay

## 2020-04-08 ENCOUNTER — Encounter: Payer: Self-pay | Admitting: Cardiovascular Disease

## 2020-04-08 ENCOUNTER — Ambulatory Visit (INDEPENDENT_AMBULATORY_CARE_PROVIDER_SITE_OTHER): Payer: Self-pay | Admitting: Cardiovascular Disease

## 2020-04-08 VITALS — BP 100/62 | HR 80 | Ht 62.0 in | Wt 131.0 lb

## 2020-04-08 DIAGNOSIS — R079 Chest pain, unspecified: Secondary | ICD-10-CM

## 2020-04-08 DIAGNOSIS — Z7901 Long term (current) use of anticoagulants: Secondary | ICD-10-CM

## 2020-04-08 DIAGNOSIS — Z952 Presence of prosthetic heart valve: Secondary | ICD-10-CM

## 2020-04-08 DIAGNOSIS — M79622 Pain in left upper arm: Secondary | ICD-10-CM

## 2020-04-08 NOTE — Patient Instructions (Signed)
Medication Instructions:  *If you need a refill on your cardiac medications before your next appointment, please call your pharmacy*  Lab Work: If you have labs (blood work) drawn today and your tests are completely normal, you will receive your results only by: Marland Kitchen MyChart Message (if you have MyChart) OR . A paper copy in the mail If you have any lab test that is abnormal or we need to change your treatment, we will call you to review the results.   Testing/Procedures: Your physician has requested that you have a upper extremity venous duplex. This test is an ultrasound of the veins in the arms. It looks at venous blood flow that carries blood from the heart to the arms.Allow thirty minutes for an Upper Venous exam. There are no restrictions or special instructions.  Follow-Up: At [facility], you and your health needs are our priority.  As part of our continuing mission to provide you with exceptional heart care, we have created designated Provider Care Teams.  These Care Teams include your primary Cardiologist (physician) and Advanced Practice Providers (APPs -  Physician Assistants and Nurse Practitioners) who all work together to provide you with the care you need, when you need it.  We recommend signing up for the patient portal called "MyChart".  Sign up information is provided on this After Visit Summary.  MyChart is used to connect with patients for Virtual Visits (Telemedicine).  Patients are able to view lab/test results, encounter notes, upcoming appointments, etc.  Non-urgent messages can be sent to your provider as well.   To learn more about what you can do with MyChart, go to [email or web address].    Your next appointment:   1 year(s)  The format for your next appointment:   In Person  Provider:   You may see Charlton Haws, MD or one of the following Advanced Practice Providers on your designated Care Team:    Georgie Chard, NP

## 2020-04-15 ENCOUNTER — Ambulatory Visit (HOSPITAL_COMMUNITY)
Admission: RE | Admit: 2020-04-15 | Discharge: 2020-04-15 | Disposition: A | Discharge status: Home / Self Care | Payer: Self-pay | Source: Ambulatory Visit | Attending: Cardiovascular Disease | Admitting: Cardiovascular Disease

## 2020-04-15 ENCOUNTER — Other Ambulatory Visit: Payer: Self-pay

## 2020-04-15 DIAGNOSIS — M79622 Pain in left upper arm: Secondary | ICD-10-CM | POA: Insufficient documentation

## 2020-04-15 DIAGNOSIS — R079 Chest pain, unspecified: Secondary | ICD-10-CM | POA: Insufficient documentation

## 2020-04-16 ENCOUNTER — Ambulatory Visit (INDEPENDENT_AMBULATORY_CARE_PROVIDER_SITE_OTHER): Payer: Self-pay | Admitting: *Deleted

## 2020-04-16 DIAGNOSIS — Z952 Presence of prosthetic heart valve: Secondary | ICD-10-CM

## 2020-04-16 DIAGNOSIS — Z5181 Encounter for therapeutic drug level monitoring: Secondary | ICD-10-CM

## 2020-04-16 LAB — POCT INR: INR: 3.4 — AB (ref 2.0–3.0)

## 2020-04-16 NOTE — Patient Instructions (Signed)
Description    Take 2 tablets of warfarin today and then continue to take 4 tablets daily except for 5 tablets on Monday, Wednesday and Friday. Recheck INR in 2 weeks. Coumadin Clinic [phone].

## 2020-04-30 ENCOUNTER — Ambulatory Visit (INDEPENDENT_AMBULATORY_CARE_PROVIDER_SITE_OTHER): Payer: Self-pay | Admitting: *Deleted

## 2020-04-30 ENCOUNTER — Other Ambulatory Visit: Payer: Self-pay

## 2020-04-30 DIAGNOSIS — Z954 Presence of other heart-valve replacement: Secondary | ICD-10-CM

## 2020-04-30 DIAGNOSIS — Z952 Presence of prosthetic heart valve: Secondary | ICD-10-CM

## 2020-04-30 DIAGNOSIS — Z7901 Long term (current) use of anticoagulants: Secondary | ICD-10-CM

## 2020-04-30 DIAGNOSIS — Z5181 Encounter for therapeutic drug level monitoring: Secondary | ICD-10-CM

## 2020-04-30 LAB — POCT INR: INR: 2.3 (ref 2.0–3.0)

## 2020-04-30 MED ORDER — WARFARIN SODIUM 2 MG PO TABS: ORAL_TABLET | ORAL | 1 refills | Status: DC

## 2020-04-30 NOTE — Patient Instructions (Signed)
Description   Today take 5.5 tablets then continue to take 4 tablets daily except for 5 tablets on Monday, Wednesday and Friday. Recheck INR in 2 weeks. Coumadin Clinic [phone].   Annette Stable [id number]*

## 2020-05-14 ENCOUNTER — Other Ambulatory Visit: Payer: Self-pay

## 2020-05-14 ENCOUNTER — Ambulatory Visit (INDEPENDENT_AMBULATORY_CARE_PROVIDER_SITE_OTHER): Payer: Self-pay | Admitting: Pharmacist

## 2020-05-14 DIAGNOSIS — Z7901 Long term (current) use of anticoagulants: Secondary | ICD-10-CM

## 2020-05-14 DIAGNOSIS — Z952 Presence of prosthetic heart valve: Secondary | ICD-10-CM

## 2020-05-14 DIAGNOSIS — Z5181 Encounter for therapeutic drug level monitoring: Secondary | ICD-10-CM

## 2020-05-14 DIAGNOSIS — Z954 Presence of other heart-valve replacement: Secondary | ICD-10-CM

## 2020-05-14 LAB — POCT INR: INR: 2.6 (ref 2.0–3.0)

## 2020-05-14 NOTE — Patient Instructions (Signed)
Continue to take 4 tablets daily except for 5 tablets on Monday, Wednesday and Friday. Recheck INR in 3 weeks. Coumadin Clinic [phone].

## 2020-06-04 ENCOUNTER — Other Ambulatory Visit: Payer: Self-pay

## 2020-06-04 ENCOUNTER — Ambulatory Visit (INDEPENDENT_AMBULATORY_CARE_PROVIDER_SITE_OTHER): Payer: Medicaid Other | Admitting: *Deleted

## 2020-06-04 DIAGNOSIS — Z952 Presence of prosthetic heart valve: Secondary | ICD-10-CM

## 2020-06-04 DIAGNOSIS — Z5181 Encounter for therapeutic drug level monitoring: Secondary | ICD-10-CM

## 2020-06-04 LAB — POCT INR: INR: 2.1 (ref 2.0–3.0)

## 2020-06-04 MED ORDER — WARFARIN SODIUM 2 MG PO TABS: ORAL_TABLET | ORAL | 1 refills | Status: DC

## 2020-06-04 NOTE — Patient Instructions (Signed)
Description   Take 6 tablets of warfarin today and 5 tablets tomorrow, then Continue to take 4 tablets daily except for 5 tablets on Monday, Wednesday and Friday. Recheck INR in 3 weeks. Coumadin Clinic [phone].   Annette Stable [id number]*

## 2020-06-05 ENCOUNTER — Other Ambulatory Visit: Payer: Self-pay | Admitting: Cardiovascular Disease

## 2020-06-15 ENCOUNTER — Encounter: Payer: Self-pay | Admitting: *Deleted

## 2020-06-25 ENCOUNTER — Ambulatory Visit (INDEPENDENT_AMBULATORY_CARE_PROVIDER_SITE_OTHER): Payer: Self-pay | Admitting: *Deleted

## 2020-06-25 ENCOUNTER — Other Ambulatory Visit: Payer: Self-pay

## 2020-06-25 DIAGNOSIS — Z5181 Encounter for therapeutic drug level monitoring: Secondary | ICD-10-CM

## 2020-06-25 DIAGNOSIS — Z952 Presence of prosthetic heart valve: Secondary | ICD-10-CM

## 2020-06-25 LAB — POCT INR: INR: 2.6 (ref 2.0–3.0)

## 2020-06-25 NOTE — Patient Instructions (Signed)
Description   Continue to take 4 tablets daily except for 5 tablets on Monday, Wednesday and Friday. Recheck INR in 4 weeks. Coumadin Clinic [phone].   Annette Stable [id number]*

## 2020-07-23 ENCOUNTER — Ambulatory Visit (INDEPENDENT_AMBULATORY_CARE_PROVIDER_SITE_OTHER): Payer: Self-pay

## 2020-07-23 ENCOUNTER — Other Ambulatory Visit: Payer: Self-pay

## 2020-07-23 DIAGNOSIS — Z954 Presence of other heart-valve replacement: Secondary | ICD-10-CM

## 2020-07-23 DIAGNOSIS — Z952 Presence of prosthetic heart valve: Secondary | ICD-10-CM

## 2020-07-23 DIAGNOSIS — Z5181 Encounter for therapeutic drug level monitoring: Secondary | ICD-10-CM

## 2020-07-23 DIAGNOSIS — Z7901 Long term (current) use of anticoagulants: Secondary | ICD-10-CM

## 2020-07-23 LAB — POCT INR: INR: 4.2 — AB (ref 2.0–3.0)

## 2020-07-23 NOTE — Patient Instructions (Signed)
Description   Skip today's dosage of Warfarin, then start taking 4 tablets daily except for 5 tablets on Mondays and Fridays. Recheck INR in 2 weeks. Coumadin Clinic [phone].   Annette Stable [phone]*

## 2020-08-06 ENCOUNTER — Ambulatory Visit (INDEPENDENT_AMBULATORY_CARE_PROVIDER_SITE_OTHER): Payer: Self-pay | Admitting: Pharmacist

## 2020-08-06 ENCOUNTER — Other Ambulatory Visit: Payer: Self-pay

## 2020-08-06 DIAGNOSIS — Z954 Presence of other heart-valve replacement: Secondary | ICD-10-CM

## 2020-08-06 DIAGNOSIS — Z7901 Long term (current) use of anticoagulants: Secondary | ICD-10-CM

## 2020-08-06 DIAGNOSIS — Z952 Presence of prosthetic heart valve: Secondary | ICD-10-CM

## 2020-08-06 DIAGNOSIS — Z5181 Encounter for therapeutic drug level monitoring: Secondary | ICD-10-CM

## 2020-08-06 LAB — POCT INR: INR: 2.3 (ref 2.0–3.0)

## 2020-08-06 NOTE — Patient Instructions (Signed)
Take 5.5 tablets today then continue taking 4 tablets daily except for 5 tablets on Mondays and Fridays. Recheck INR in 2 weeks. Coumadin Clinic [phone].

## 2020-08-20 ENCOUNTER — Other Ambulatory Visit: Payer: Self-pay

## 2020-08-20 ENCOUNTER — Ambulatory Visit (INDEPENDENT_AMBULATORY_CARE_PROVIDER_SITE_OTHER): Payer: Self-pay | Admitting: *Deleted

## 2020-08-20 DIAGNOSIS — Z5181 Encounter for therapeutic drug level monitoring: Secondary | ICD-10-CM

## 2020-08-20 DIAGNOSIS — Z7901 Long term (current) use of anticoagulants: Secondary | ICD-10-CM

## 2020-08-20 DIAGNOSIS — Z954 Presence of other heart-valve replacement: Secondary | ICD-10-CM

## 2020-08-20 DIAGNOSIS — Z952 Presence of prosthetic heart valve: Secondary | ICD-10-CM

## 2020-08-20 LAB — POCT INR: INR: 3.2 — AB (ref 2.0–3.0)

## 2020-08-20 NOTE — Patient Instructions (Signed)
Description   Take 4 tablets today then continue taking 4 tablets daily except for 5 tablets on Mondays and Fridays. Recheck INR in 2 weeks. Coumadin Clinic [phone].   Annette Stable [id number]*

## 2020-09-06 ENCOUNTER — Other Ambulatory Visit: Payer: Self-pay

## 2020-09-06 ENCOUNTER — Ambulatory Visit (INDEPENDENT_AMBULATORY_CARE_PROVIDER_SITE_OTHER): Payer: Self-pay | Admitting: *Deleted

## 2020-09-06 DIAGNOSIS — Z7901 Long term (current) use of anticoagulants: Secondary | ICD-10-CM

## 2020-09-06 DIAGNOSIS — Z954 Presence of other heart-valve replacement: Secondary | ICD-10-CM

## 2020-09-06 DIAGNOSIS — Z5181 Encounter for therapeutic drug level monitoring: Secondary | ICD-10-CM

## 2020-09-06 DIAGNOSIS — Z952 Presence of prosthetic heart valve: Secondary | ICD-10-CM

## 2020-09-06 LAB — POCT INR: INR: 2.8 (ref 2.0–3.0)

## 2020-09-06 NOTE — Patient Instructions (Signed)
Description   Continue taking 4 tablets daily except for 5 tablets on Mondays and Fridays. Recheck INR in 3 weeks. Coumadin Clinic [phone].   Annette Stable [id number]*

## 2020-09-15 ENCOUNTER — Other Ambulatory Visit: Payer: Self-pay

## 2020-09-15 MED ORDER — WARFARIN SODIUM 2 MG PO TABS: ORAL_TABLET | ORAL | 1 refills | Status: DC

## 2020-09-15 NOTE — Addendum Note (Signed)
Addended by: Memory Dance on: 09/15/2020 01:17 PM   Modules accepted: Orders

## 2020-09-15 NOTE — Telephone Encounter (Addendum)
Prescription refill request received for warfarin Lov: 04/08/2020 Patricia Frederick) Next INR check: 10/01/20 Warfarin tablet strength: 2mg   Appropriate dose and refill sent to requested pharmacy.

## 2020-10-08 ENCOUNTER — Ambulatory Visit (INDEPENDENT_AMBULATORY_CARE_PROVIDER_SITE_OTHER): Payer: Self-pay

## 2020-10-08 ENCOUNTER — Other Ambulatory Visit: Payer: Self-pay

## 2020-10-08 DIAGNOSIS — Z954 Presence of other heart-valve replacement: Secondary | ICD-10-CM

## 2020-10-08 DIAGNOSIS — Z7901 Long term (current) use of anticoagulants: Secondary | ICD-10-CM

## 2020-10-08 DIAGNOSIS — Z5181 Encounter for therapeutic drug level monitoring: Secondary | ICD-10-CM

## 2020-10-08 DIAGNOSIS — Z952 Presence of prosthetic heart valve: Secondary | ICD-10-CM

## 2020-10-08 LAB — POCT INR: INR: 3.2 — AB (ref 2.0–3.0)

## 2020-10-08 NOTE — Patient Instructions (Signed)
Description   Take 3 tablets today, then resume same dosage 4 tablets daily except for 5 tablets on Mondays and Fridays. Recheck INR in 3 weeks. Pt is moving to [place].  Gave script to have INR drawn at lab in [place] in 3 weeks and prn x 3 months.  Pt has been instructed to find local primary and cardiology MDs.  [facility] [phone].   Patricia Frederick [id number]*

## 2020-10-25 ENCOUNTER — Ambulatory Visit: Payer: BC Managed Care – PPO | Admitting: Thoracic Surgery (Cardiothoracic Vascular Surgery)

## 2020-10-25 NOTE — Progress Notes (Signed)
CARDIOLOGY CONSULT NOTE     Virtual Visit via Video Note   This visit type was conducted due to national recommendations for restrictions regarding the COVID-19 Pandemic (e.g. social distancing) in an effort to limit this patient's exposure and mitigate transmission in our community.  Due to her co-morbid illnesses, this patient is at least at moderate risk for complications without adequate follow up.  This format is felt to be most appropriate for this patient at this time.  All issues noted in this document were discussed and addressed.  A limited physical exam was performed with this format.  Please refer to the patient's chart for her consent to telehealth for [facility].   Patient Location : Home Physician Location: Office   Patient ID: Latreece Mochizuki MRN: [id number] DOB/AGE: 04-14-1990 30 y.o.   Primary Cardiologist: Justice Deeds  Used through out exam as patient does not speak english Interpretor Nep[place]e via [place]   HPI: 30 y.o. [place] female f/u AVR. First seen May 2021 noted to have severe AS with mean gradient 49 mmHg peak 93 mmHg  Cardiac CT done 09/03/19 with calcium score 0 normal right dominant cors Aortic root 3.8 cm   Had 21 mm Sorin Carbomedics Top Hat bileaflet valve with Nicks procedure bovine pericardial patch root enlargement with Dr Cornelius Moras 10/15/19.  Did well post op D/C with coumadin, iron, lopressor 12.5 mg bid and 81 mg ASA    TTE 12/09/19 with EF 60-65% mean gradient 11 peak 17 mmHg with trivial AR   INR have been Rx with no bleeding issues  Has two children 9/12  Muscular left arm pain negative UE duplex 04/15/20   Moved to [place] area to get Medicaid benefits  Has appointment Friday to get INR checked Still needs Cardiology appointment and scripts   ROS All other systems reviewed and negative except as noted above  Past Medical History:  Diagnosis Date   Anemia    Aortic stenosis, severe    echo 9/12: EF 55-65%, severe  AS, mean gradient 49 mmHg, unicuspid Aortic Valve   Hemoglobin E trait (HCC)    A E Hgb E trait   Rheumatic heart disease 2011   S/P aortic valve replacement with mechanical valve 10/15/2019   21 mm Carbomedics Top Hat bileaflet mechanical valve with aortic root enlargement   Tachycardia     Family History  Problem Relation Age of Onset   Stroke Neg Hx    Cancer Neg Hx    Asthma Other    Anesthesia problems Neg Hx     Social History   Socioeconomic History   Marital status: Married    Spouse name: Not on file   Number of children: Not on file   Years of education: Not on file   Highest education level: Not on file  Occupational History   Not on file  Tobacco Use   Smoking status: Never   Smokeless tobacco: Never  Vaping Use   Vaping Use: Never used  Substance and Sexual Activity   Alcohol use: Never   Drug use: Never   Sexual activity: Yes  Other Topics Concern   Not on file  Social History Narrative   ** Merged History Encounter **       Social Determinants of Health   Financial Resource Strain: Not on file  Food Insecurity: Not on file  Transportation Needs: Not on file  Physical Activity: Not on file  Stress: Not on file  Social Connections: Not  on file  Intimate Partner Violence: Not on file    Past Surgical History:  Procedure Laterality Date   AORTIC VALVE REPLACEMENT N/A 10/15/2019   Procedure: AORTIC VALVE REPLACEMENT (AVR) WITH CARBOMEDICS SUPRA-ANNULAR (TOP HAT) 21 VALVE.;  Surgeon: Purcell Nails, MD;  Location: [facility] OR;  Service: Open Heart Surgery;  Laterality: N/A;   ASCENDING AORTIC ROOT REPLACEMENT N/A 10/15/2019   Procedure: ASCENDING AORTIC ROOT ENLARGEMENT WITH PERI-GUARD PATCH.;  Surgeon: Purcell Nails, MD;  Location: [facility] OR;  Service: Open Heart Surgery;  Laterality: N/A;   CESAREAN SECTION     EYE SURGERY     per patient, had a mass above her eye and it was removed maybe in  2017/2018   NO PAST SURGERIES     TEE WITHOUT CARDIOVERSION N/A  10/15/2019   Procedure: TRANSESOPHAGEAL ECHOCARDIOGRAM (TEE);  Surgeon: Purcell Nails, MD;  Location: [facility] OR;  Service: Open Heart Surgery;  Laterality: N/A;      Current Outpatient Medications:    acetaminophen (TYLENOL) 500 MG tablet, Take 500 mg by mouth every 6 (six) hours as needed for mild pain., Disp: , Rfl:    warfarin (COUMADIN) 2 MG tablet, Take 4 tablets daily except 5 tablets on Mondays and Fridays or As directed., Disp: 135 tablet, Rfl: 1    Physical Exam: There were no vitals taken for this visit.    No distress No tachypnea No edema No JVP elevation    Labs:   Lab Results  Component Value Date   WBC 11.0 (H) 10/18/2019   HGB 7.5 (L) 10/18/2019   HCT 23.6 (L) 10/18/2019   MCV 75.6 (L) 10/18/2019   PLT 169 10/18/2019     Radiology: No results found.  EKG: SR rate 72 normal 06/30/19    ASSESSMENT AND PLAN:   1. AVR:  Post op echo 12/09/19 acceptable with trivial AR and mean gradient 11 mmHg SBE prophylaxis f/u in [place] Made arrangements to fax records to [facility] [place] # [phone] and called scripts into the [facility] [address] [place] # [phone] 2. BP:  Has been low beta blocker d/c encouraged hydration  3. Left arm pain:  LUE venous duplex with no DVT 04/15/20 muscular observe   F/U in [place]   Time:  25 minutes reviewing operative note post op echo labs cath direct patient interview arranging f/u and scripts in [place] and composing note   Signed: Charlton Haws 10/25/2020, 11:39 AM

## 2020-10-27 ENCOUNTER — Telehealth (INDEPENDENT_AMBULATORY_CARE_PROVIDER_SITE_OTHER): Payer: Self-pay | Admitting: Cardiovascular Disease

## 2020-10-27 ENCOUNTER — Other Ambulatory Visit: Payer: Self-pay | Admitting: *Deleted

## 2020-10-27 DIAGNOSIS — Z952 Presence of prosthetic heart valve: Secondary | ICD-10-CM

## 2020-10-27 DIAGNOSIS — Z954 Presence of other heart-valve replacement: Secondary | ICD-10-CM

## 2020-10-27 DIAGNOSIS — Z7901 Long term (current) use of anticoagulants: Secondary | ICD-10-CM

## 2020-10-27 DIAGNOSIS — I10 Essential (primary) hypertension: Secondary | ICD-10-CM

## 2020-10-27 MED ORDER — WARFARIN SODIUM 2 MG PO TABS: ORAL_TABLET | ORAL | 1 refills | Status: AC

## 2020-10-27 NOTE — Patient Instructions (Signed)
Medication Instructions:  *If you need a refill on your cardiac medications before your next appointment, please call your pharmacy*  Lab Work: If you have labs (blood work) drawn today and your tests are completely normal, you will receive your results only by: MyChart Message (if you have MyChart) OR A paper copy in the mail If you have any lab test that is abnormal or we need to change your treatment, we will call you to review the results.  Follow-Up: At [facility], you and your health needs are our priority.  As part of our continuing mission to provide you with exceptional heart care, we have created designated Provider Care Teams.  These Care Teams include your primary Cardiologist (physician) and Advanced Practice Providers (APPs -  Physician Assistants and Nurse Practitioners) who all work together to provide you with the care you need, when you need it.  We recommend signing up for the patient portal called "MyChart".  Sign up information is provided on this After Visit Summary.  MyChart is used to connect with patients for Virtual Visits (Telemedicine).  Patients are able to view lab/test results, encounter notes, upcoming appointments, etc.  Non-urgent messages can be sent to your provider as well.   To learn more about what you can do with MyChart, go to [email or web address].    Your next appointment:   As needed  You will get a call from medical records to help with getting records to your new doctor.

## 2020-11-01 ENCOUNTER — Telehealth: Payer: Self-pay

## 2020-11-03 ENCOUNTER — Telehealth: Payer: Self-pay | Admitting: *Deleted

## 2020-11-03 NOTE — Telephone Encounter (Signed)
Pt is overdue to have her INR checked. Attempted to call pt. No answer.

## 2020-11-08 ENCOUNTER — Telehealth: Payer: Self-pay | Admitting: *Deleted

## 2020-11-08 NOTE — Telephone Encounter (Signed)
Pt moved to [place] and overdue to have INR checked. Called and spoke to pt's husband. Made him aware that pt was due to have INR check. Instructed him that pt will need to go to a lab close to them and get INR drawn. Pt was given a prescription at last visit. Informed them to call us and let us know when pt get's INR drawn. Gave them the phone number to the coumadin clinic [phone].   Pt's husband stated that pt does not have a PCP. Per husband Pt is scheduled to see cardiologist from [place] in October or December. Husband in unaware of cardiologists name.

## 2020-11-09 NOTE — Telephone Encounter (Signed)
Pt overdue - called pt

## 2020-11-10 ENCOUNTER — Other Ambulatory Visit: Payer: Self-pay | Admitting: Cardiovascular Disease

## 2020-11-10 DIAGNOSIS — Z954 Presence of other heart-valve replacement: Secondary | ICD-10-CM

## 2020-11-10 NOTE — Telephone Encounter (Signed)
Received refill request for Warfarin. Pt received a refill on 10/27/20 for a 30 day supply. Pt moved to [place] and was advised to get established with provider for monitoring and refills on 11/01/20. Attempted to call patient again today and make her aware, no answer/unable to leave message.

## 2020-11-15 ENCOUNTER — Telehealth: Payer: Self-pay | Admitting: *Deleted

## 2020-11-15 NOTE — Telephone Encounter (Signed)
Called and spoke to pt's husband. Asked if pt was abel to get INR checked, he stated no and that pt has an appointment to see PCP Dr on 10/20.   Unsure who the Dr is. When I asked pt's husband he originally said that he did not know and then he said Dr Mabeline Caras. When I asked him to spell it he said THOR. However, I do not see a Dr Mabeline Caras in [place].   Informed him that pt is at risk to develop blood clot, stroke or bleed not having INR checked. Asked if he would be able take pt and go to a local lab to have INR checked and use the order he was given at last coumadin check. He stated no. He also stated that pt will need a refill of warfarin.

## 2020-11-15 NOTE — Telephone Encounter (Addendum)
Refill sent on 9/14 for Warfarin-1 month supply with 1 refill.  Pt should have enough to get to Dr appointment on 10/20.   Attempted to call interpreter to help communicate with pt's husband. However, when calling unable to get in touch with him and unable to leave message. Attempted to call him with the interpreter X2  Attempted to call patient using the interpreter X1. No answer. Unable to leave message.    Interpreter number#[phone] Access [phone]

## 2020-11-16 LAB — PROTIME-INR: INR: 2.8 — AB (ref 0.9–1.1)

## 2020-11-16 NOTE — Telephone Encounter (Addendum)
Called pt's husband with interpreter to make sure that there was no language barrier.   Confirmed that pt has appointment with Dr on 10/20. Pt's husband stated that it is to be with Dr. Mabeline Caras, (T-[id number]) could not give me physician's phone number at first but then said it was [phone] ( phone number for [facility] per google).   Asked if pt could go to a local lab to have pt's INR checked. Asked if he had the order that was given to pt at last INR check- he said no. Instructed pt to find a local lab and get there fax number and we will fax an order for pt get pt's INR checked until she is able to see her Dr on 10/20.  Pt's husband stated he would do that today. Gave him the phone number to contact me at [phone]. So I can fax over the order.  Also, informed pt's husband that a presciption of warfarin was called in on 9/14 at the [facility] in [place]. It was a 1 month supply with 1 refill so it should be enough to last her until her appointment with the Dr.   Asked pt's husband if there were any  questions that I could answer while I was on the phone. He said no.

## 2020-11-17 ENCOUNTER — Ambulatory Visit (INDEPENDENT_AMBULATORY_CARE_PROVIDER_SITE_OTHER): Payer: Self-pay

## 2020-11-17 DIAGNOSIS — Z5181 Encounter for therapeutic drug level monitoring: Secondary | ICD-10-CM

## 2020-11-17 DIAGNOSIS — Z7901 Long term (current) use of anticoagulants: Secondary | ICD-10-CM

## 2020-11-17 NOTE — Patient Instructions (Signed)
Description   Spoke with pt and husband and instructed to continue taking 4 tablets daily except for 5 tablets on Mondays and Fridays. Recheck INR in 3 weeks. Pt has moved to [place]. Gave script to have INR drawn at lab in [place] in 3 weeks and prn x 3 months. Pt's husband stated pt has Provider in [place] and her first appt is on October 20th and INR will be checked at that time. Coumadin Clinic [phone].   Patricia Frederick [id number]*

## 2020-12-02 ENCOUNTER — Telehealth: Payer: Self-pay

## 2020-12-02 NOTE — Telephone Encounter (Signed)
Attempted to call pt and make her aware that INR is due today.

## 2020-12-03 ENCOUNTER — Telehealth: Payer: Self-pay

## 2020-12-03 NOTE — Telephone Encounter (Signed)
Pt moved to [place] and overdue to have INR checked. Called and spoke with pt's husband. Made him aware that pt was overdue for INR to be checked and pt needed to be established with provider in [place]. Pt's husband stated pt had an appt scheduled with PCP on November 3rd at [facility] in [place]. I explained the importance of this PCP appt and the importance of having INR checked. Called [facility] and spoke with Joelle. Confirmed that pt has scheduled appt with Dr Lesly Dukes on November 3rd and Molinda Bailiff will now be managed with new provider.   [facility]  [address] [place], [place] [address] [phone]

## 2020-12-16 ENCOUNTER — Telehealth: Payer: Self-pay

## 2020-12-16 NOTE — Telephone Encounter (Signed)
Called [facility] and confirmed pt was seen this morning in the office. Pt's care and INR will now be managed at [facility] per Brayton Caves, Medical Office Assistant. Will dismiss episode.   [facility]  [address] [place], [place] [address] [phone]

## 2021-08-25 IMAGING — CR DG CHEST 2V
2 series · 2 of 2 positions shown · non-contrast
Comparison: CT 09/03/2019, radiograph 06/29/2019

CLINICAL DATA: Pre-admission for cardiac surgery 10/15/2019, aortic
valve stenosis

EXAM:
CHEST - 2 VIEW

[w chest pa]
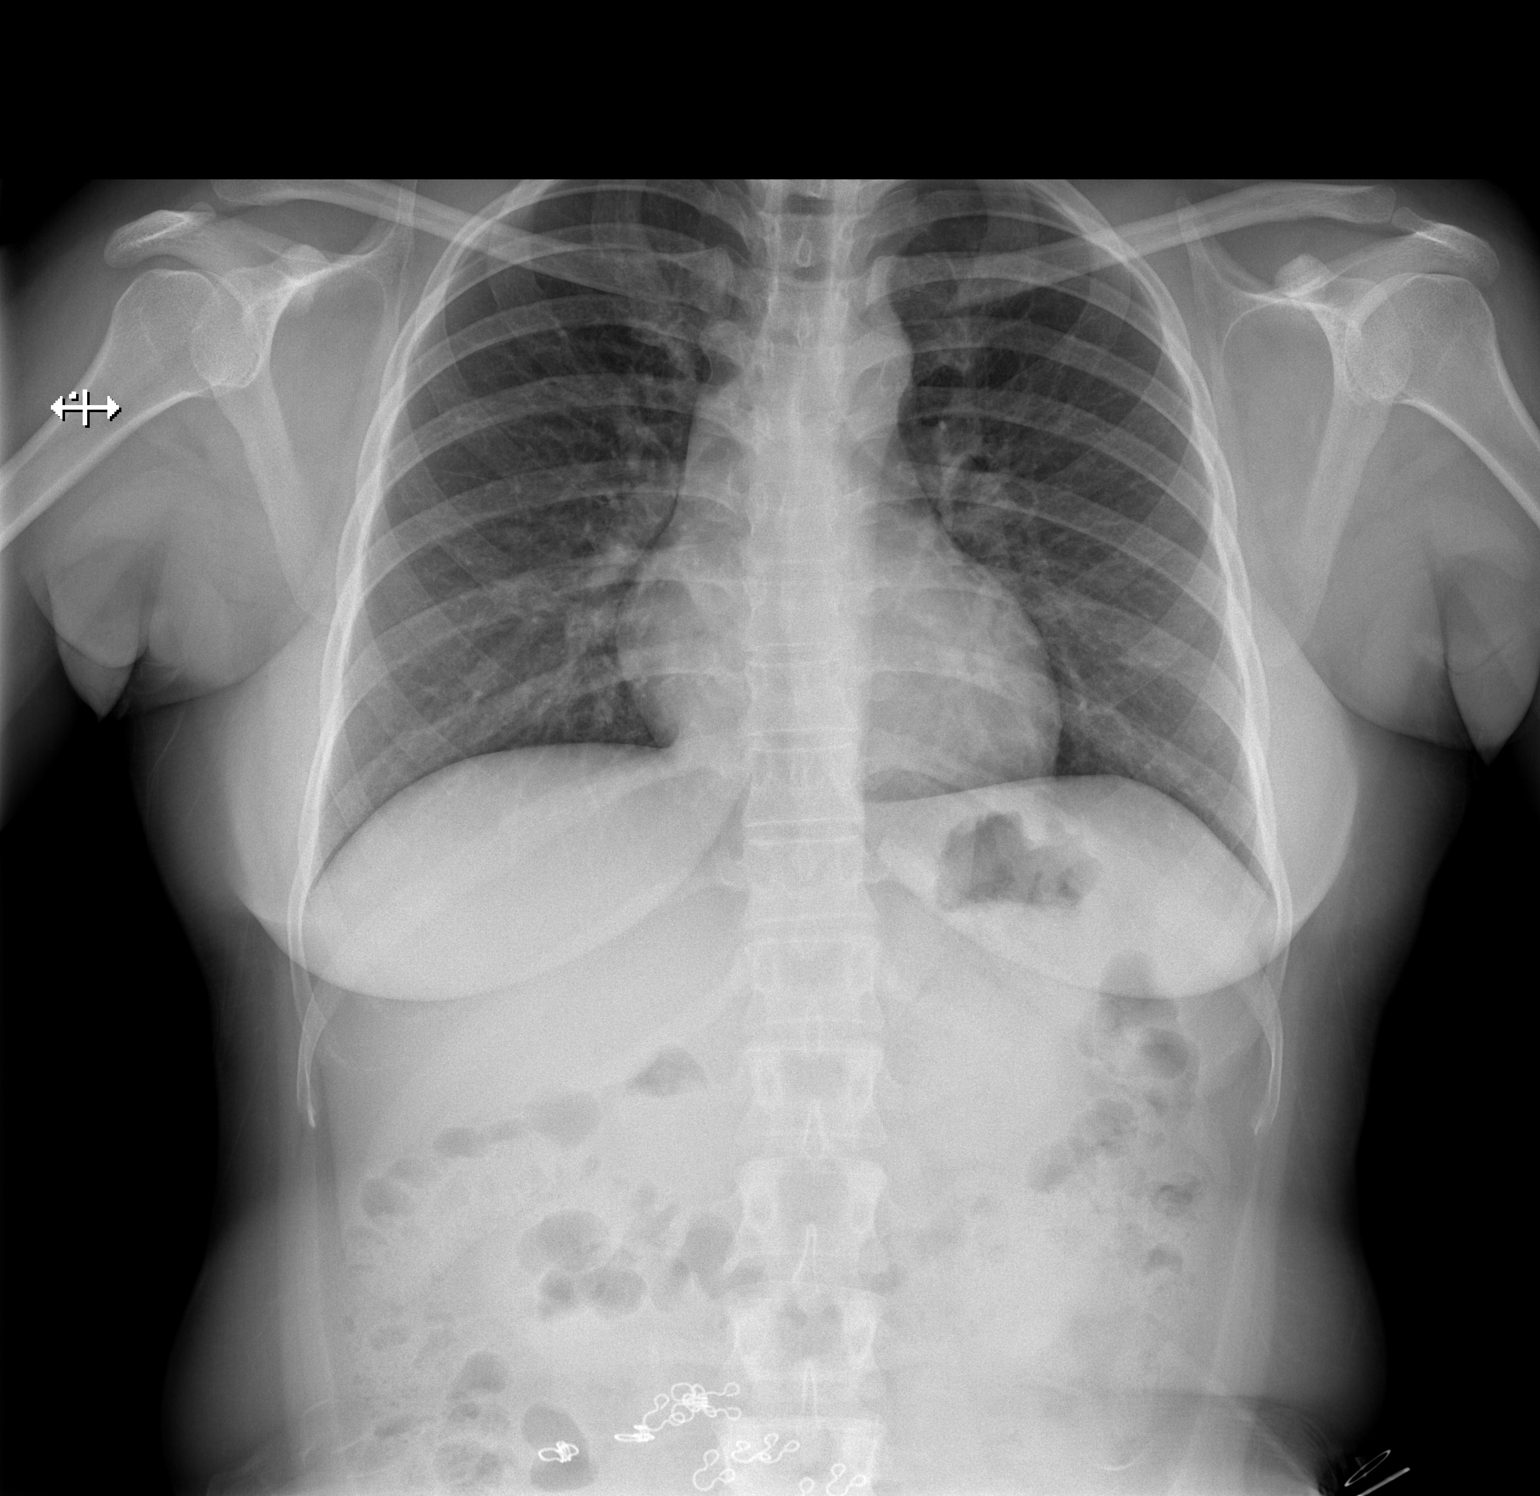

[w chest lat]
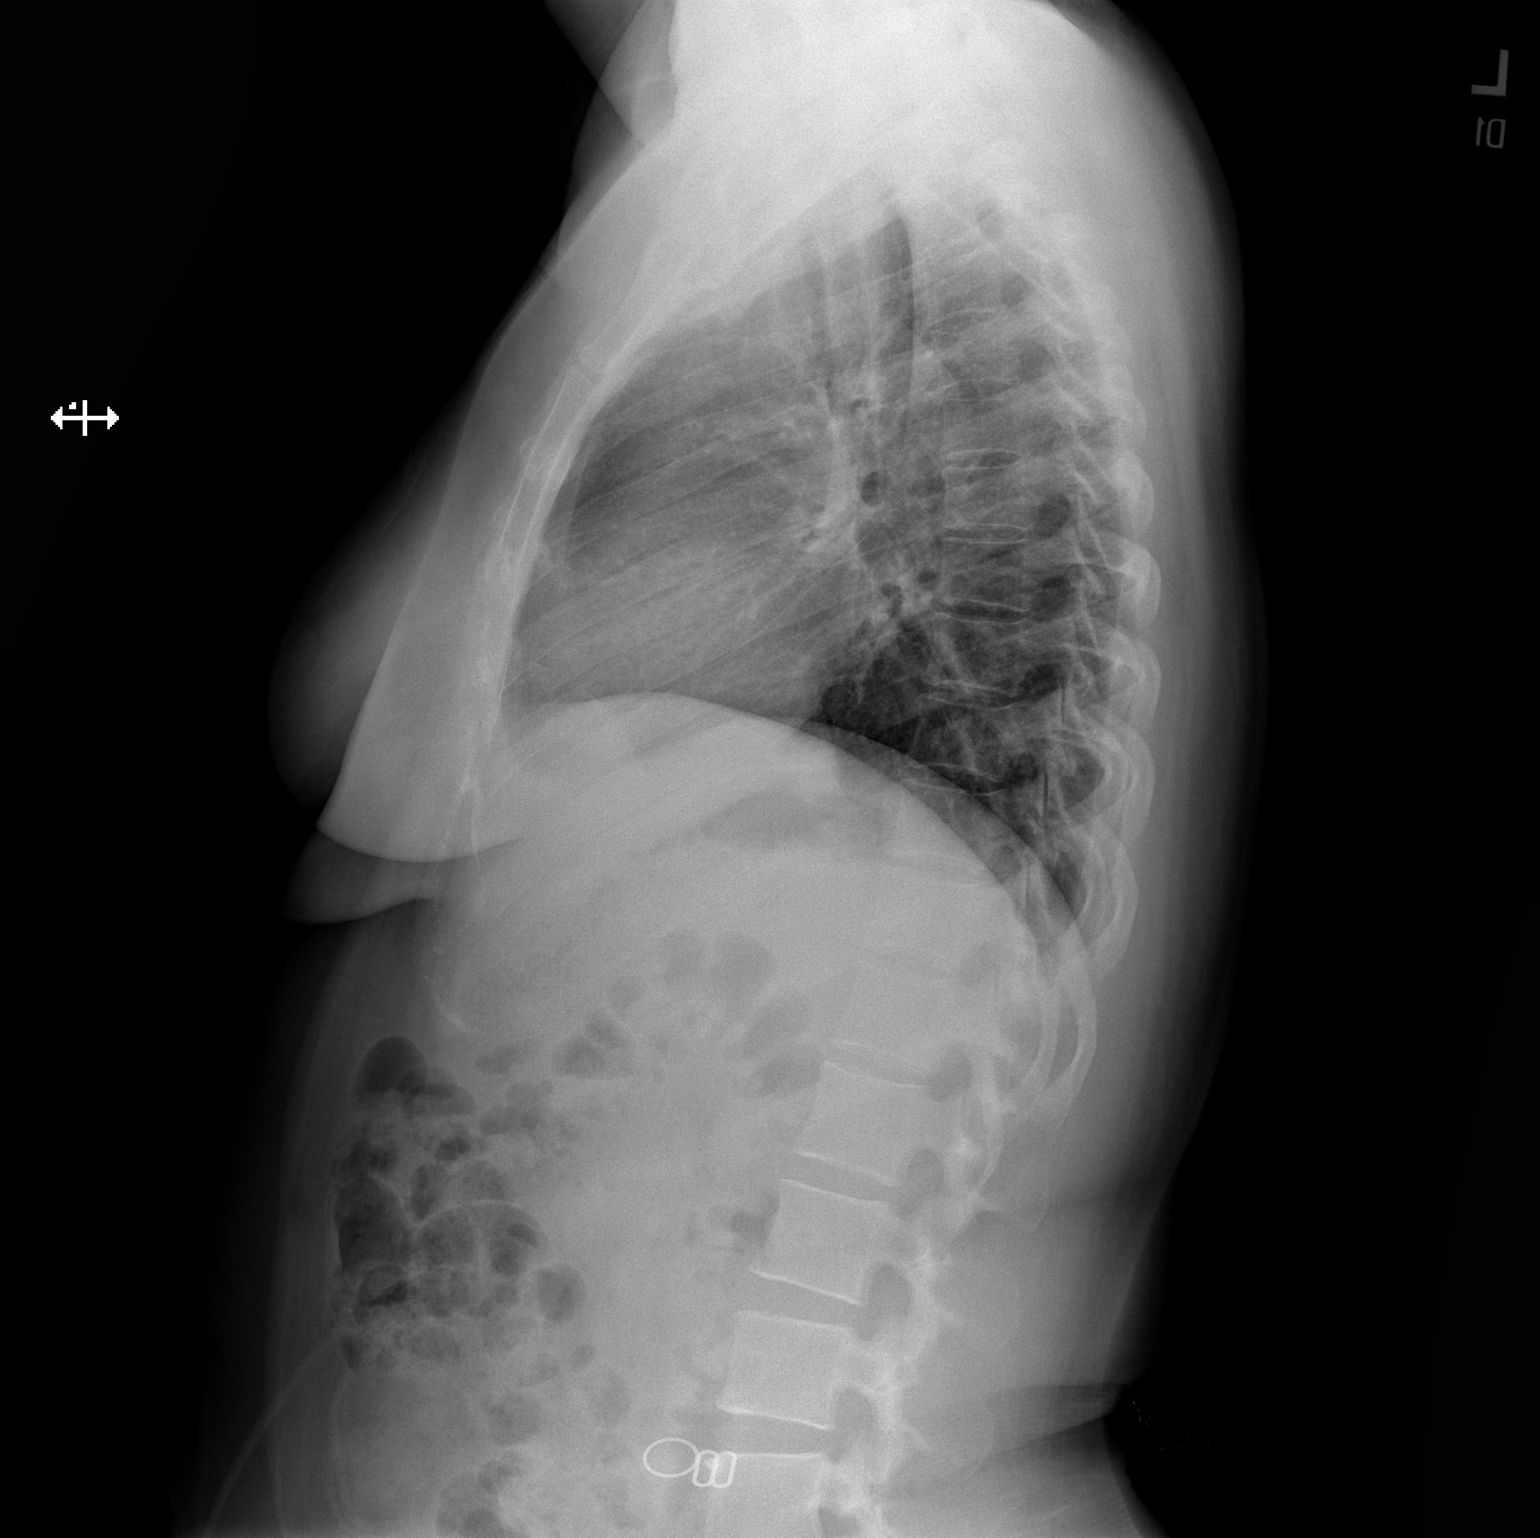

[2 of 2 positions shown; findings below may reference images not displayed]

FINDINGS: No consolidation, features of edema, pneumothorax, or effusion.
Pulmonary vascularity is normally distributed. The cardiomediastinal
contours are unremarkable. No acute osseous or soft tissue
abnormality.
IMPRESSION: No acute cardiopulmonary abnormality.

## 2021-08-27 IMAGING — DX DG CHEST 1V PORT
1 series · 1 of 1 positions shown · non-contrast
Comparison: None.

CLINICAL DATA: Status post aortic valve replacement, postoperative
examination

EXAM:
PORTABLE CHEST 1 VIEW

[chest ap]
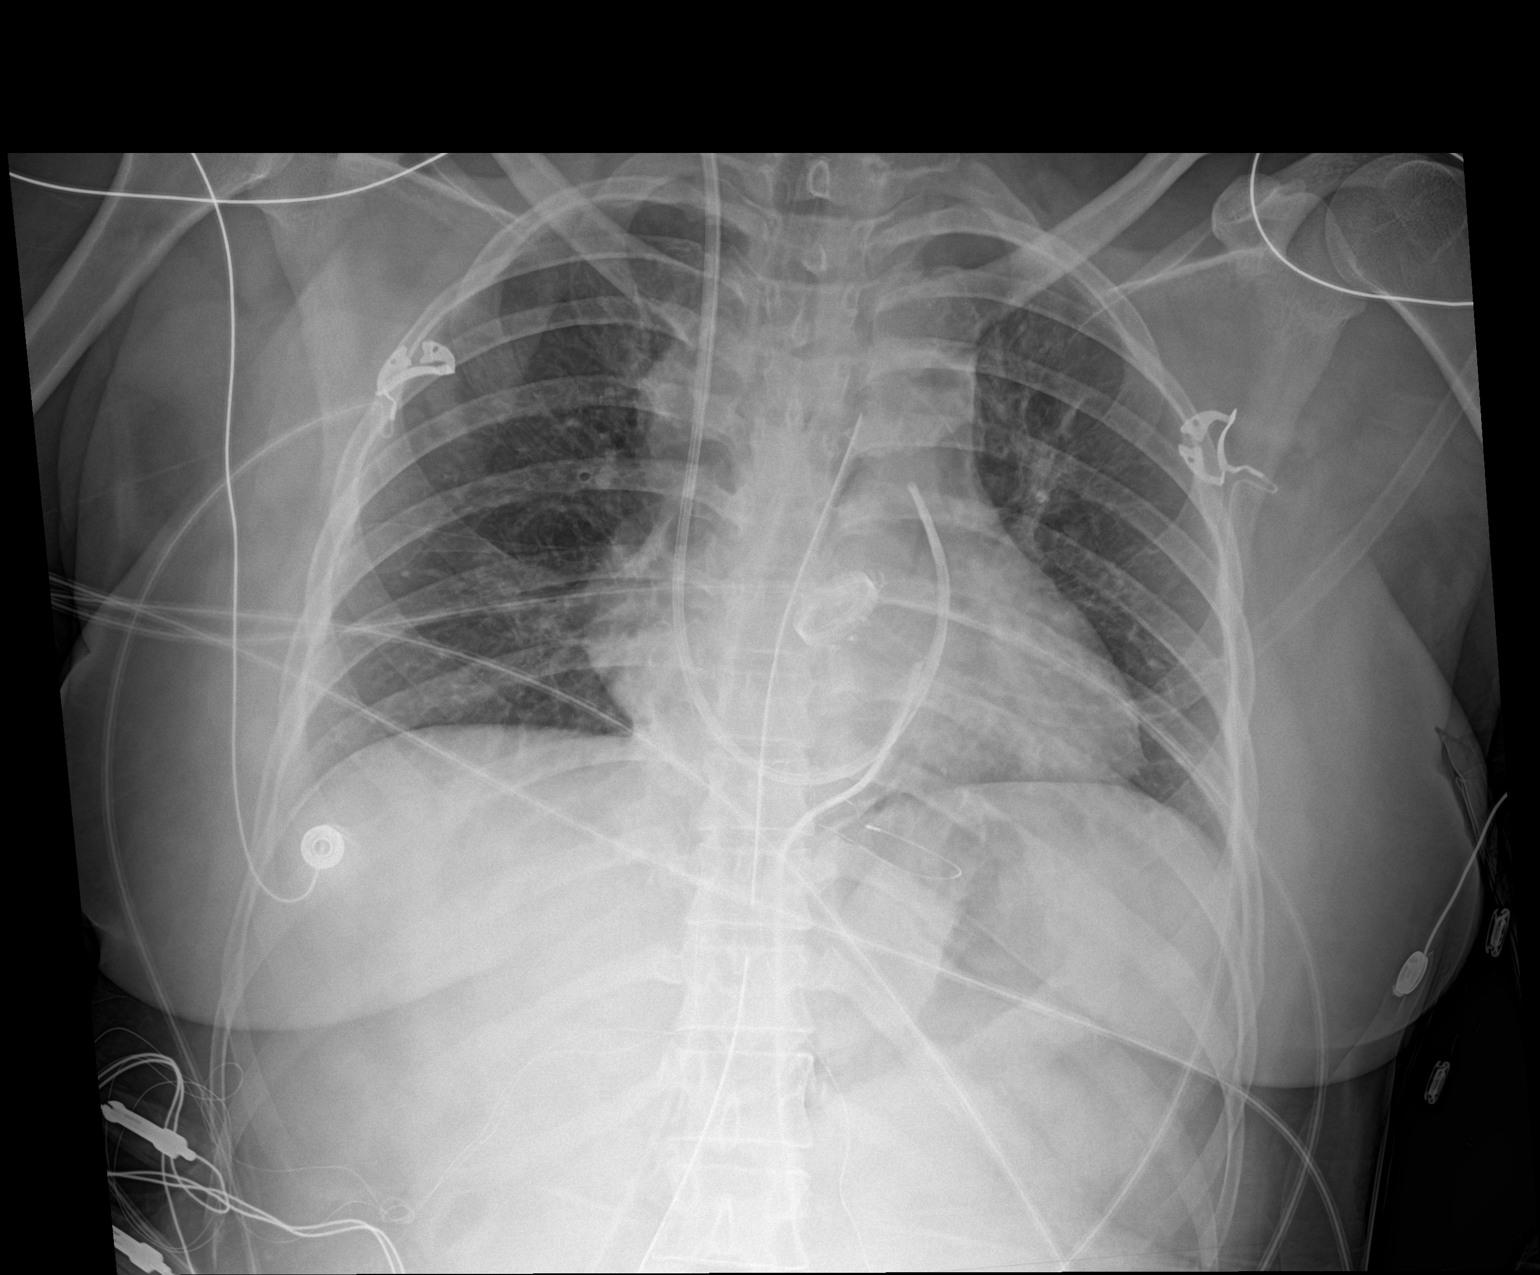

[1 of 1 positions shown; findings below may reference images not displayed]

FINDINGS: Lung volumes are small. There is mild left basilar atelectasis and
slight asymmetric relative left-sided volume loss. No pneumothorax
or pleural effusion. Right internal jugular Swan-Ganz catheter seen
with its tip within the main pulmonary artery. Mediastinal drain is
in place. Left basilar chest tube extends into the retrocardiac
region. Aortic valve replacement has been performed. Cardiac size
within normal limits. Pulmonary vascularity is normal. No acute bone
abnormality.
IMPRESSION: 1. Support lines and tubes as described.
2. Mild left basilar atelectasis.
3. No pneumothorax or pleural effusion.

## 2021-08-28 IMAGING — DX DG CHEST 1V PORT
1 series · 1 of 1 positions shown · non-contrast
Comparison: 10/15/2019.

CLINICAL DATA: Open-heart surgery.  Chest tube.

EXAM:
PORTABLE CHEST 1 VIEW

[chest ap]
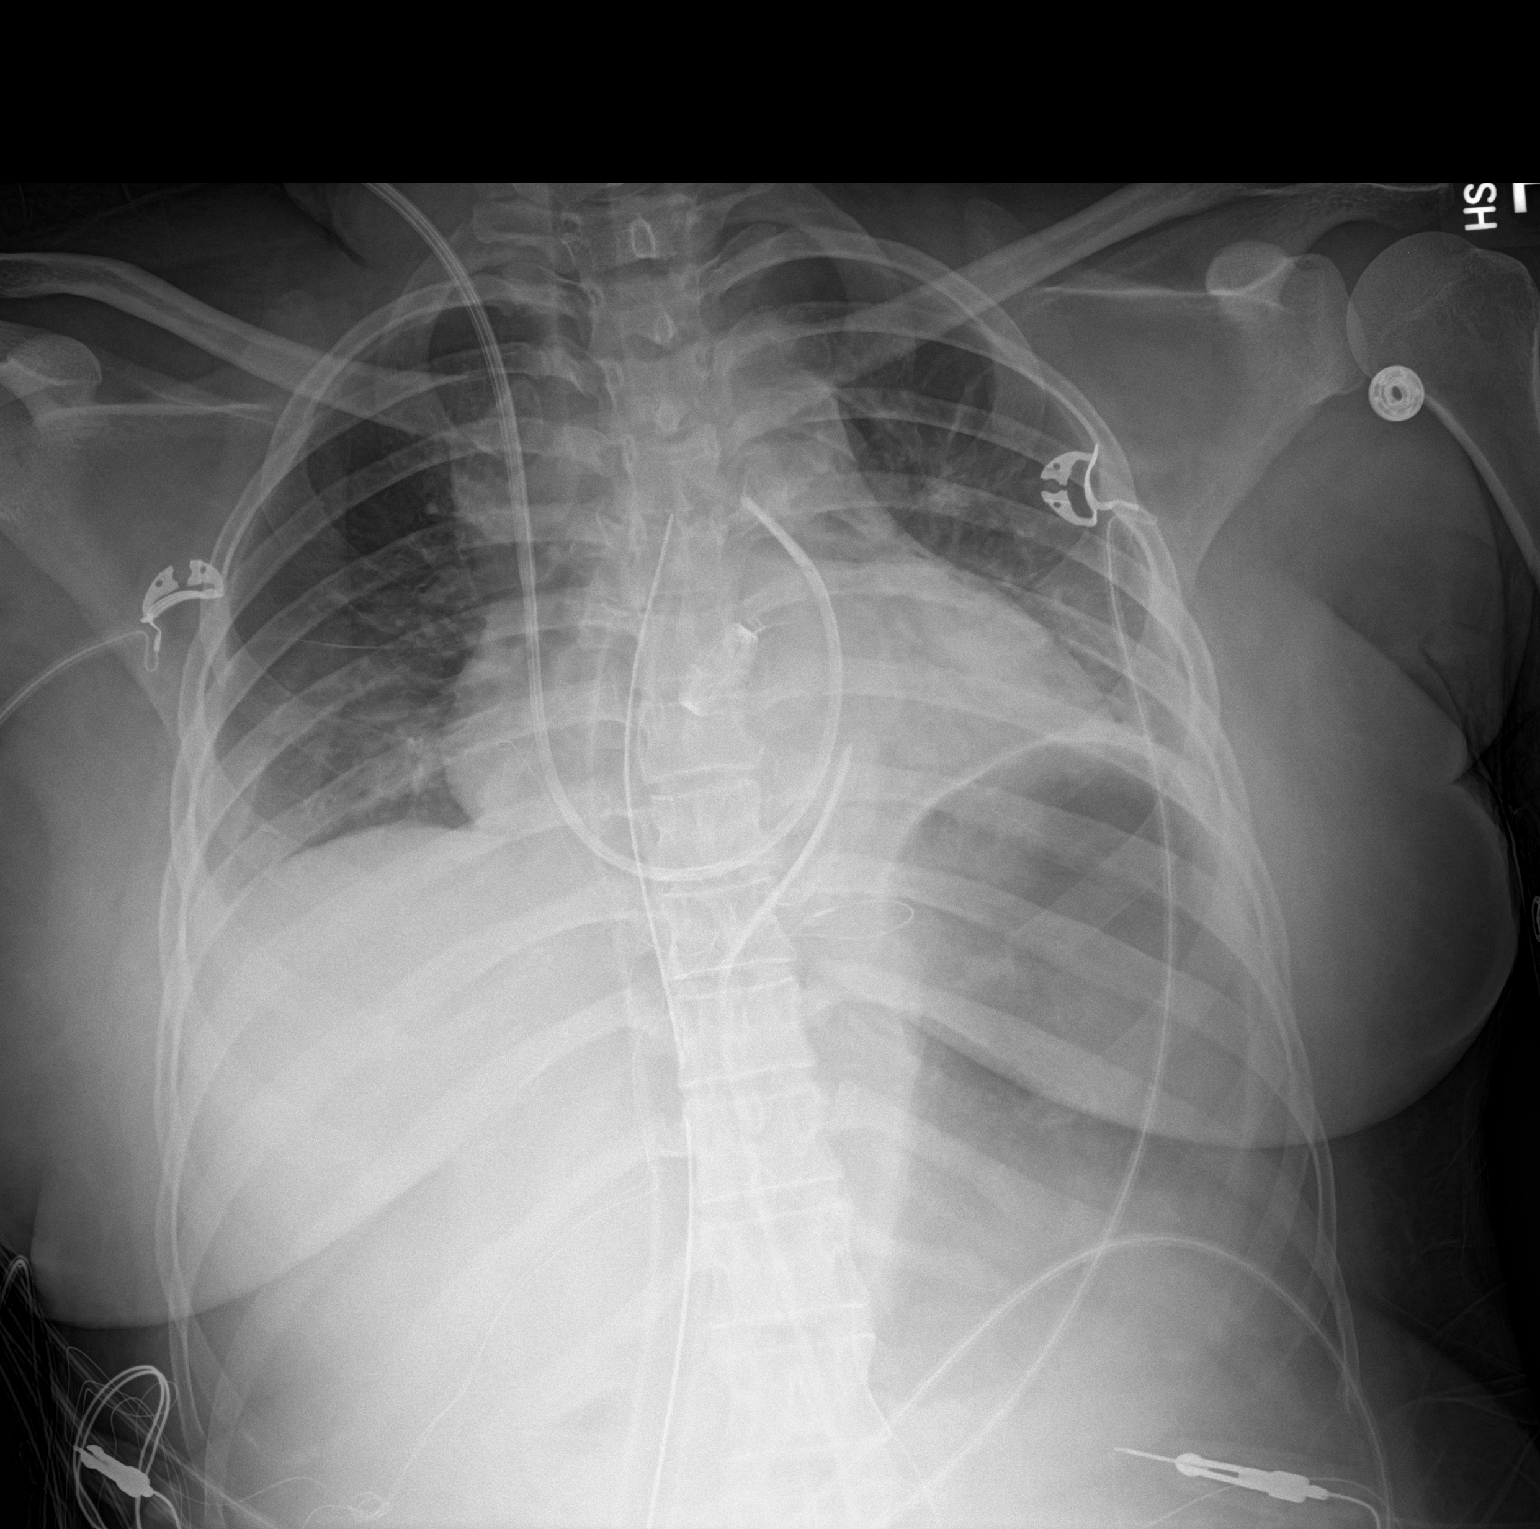

[1 of 1 positions shown; findings below may reference images not displayed]

FINDINGS: Patient is rotated to the right. Swan-Ganz catheter,, mediastinal
drainage catheter, chest tube in stable position. Cardiac valve
replacement. Cardiomegaly. Low lung volumes. Mild bibasilar
infiltrates/edema and small bilateral pleural effusions. Mild CHF
could present this fashion. Gastric distention noted.
IMPRESSION: 1.  Lines and tubes in stable position.  No pneumothorax.

2. Cardiomegaly. Low lung volumes. Mild bibasilar infiltrates/edema
and small bilateral pleural effusions suggesting mild CHF.

3.  Gastric distention.

## 2021-08-29 IMAGING — DX DG CHEST 1V PORT
1 series · 1 of 1 positions shown · non-contrast
Comparison: 10/16/2019.

CLINICAL DATA: Open-heart surgery.  Chest tube.

EXAM:
PORTABLE CHEST 1 VIEW

[chest ap]
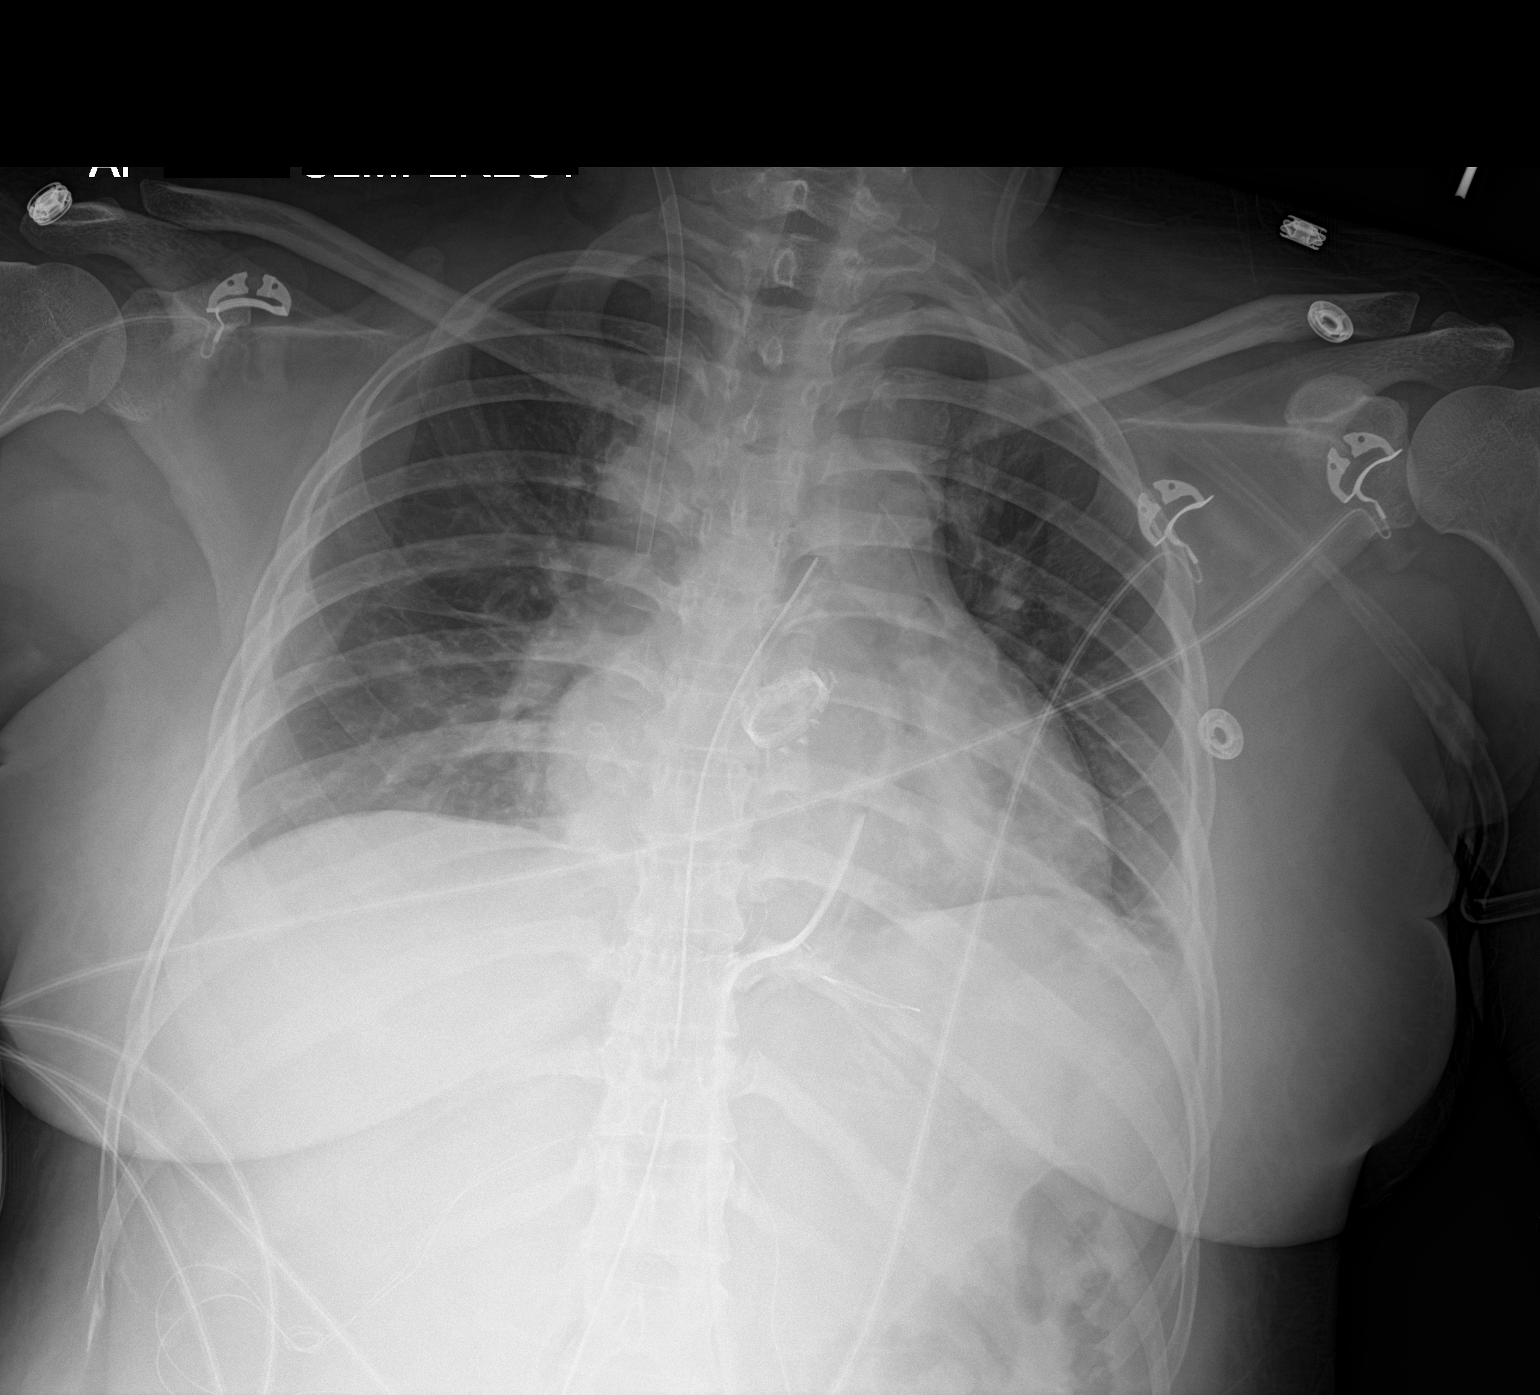

[1 of 1 positions shown; findings below may reference images not displayed]

FINDINGS: Interim removal Swan-Ganz catheter. Mediastinal drainage catheter
and chest tube in stable position. Cardiac valve replacement. Heart
size stable. Bibasilar atelectasis with improved aeration from prior
exam. Improved right pleural effusion. No pneumothorax.
IMPRESSION: 1. Interim removal Swan-Ganz catheter. Mediastinal drainage catheter
chest tube in stable position. No pneumothorax.

2.  Prior cardiac valve replacement.  Heart size stable.

3. Bibasilar atelectasis with improved aeration from prior exam.
Improved right pleural effusion. No evidence of CHF noted on today's
exam.
